# Patient Record
Sex: Male | Born: 1950
Health system: Southern US, Community
[De-identification: ages and names within clinical notes are randomized; demographics above are authoritative.]

## PROBLEM LIST (undated history)

## (undated) DIAGNOSIS — M1711 Unilateral primary osteoarthritis, right knee: Secondary | ICD-10-CM

## (undated) DIAGNOSIS — H9190 Unspecified hearing loss, unspecified ear: Secondary | ICD-10-CM

## (undated) DIAGNOSIS — Q2112 Patent foramen ovale: Secondary | ICD-10-CM

## (undated) DIAGNOSIS — Z8673 Personal history of transient ischemic attack (TIA), and cerebral infarction without residual deficits: Secondary | ICD-10-CM

## (undated) DIAGNOSIS — I82409 Acute embolism and thrombosis of unspecified deep veins of unspecified lower extremity: Secondary | ICD-10-CM

## (undated) DIAGNOSIS — D689 Coagulation defect, unspecified: Secondary | ICD-10-CM

## (undated) DIAGNOSIS — I639 Cerebral infarction, unspecified: Secondary | ICD-10-CM

## (undated) DIAGNOSIS — E785 Hyperlipidemia, unspecified: Secondary | ICD-10-CM

## (undated) DIAGNOSIS — I10 Essential (primary) hypertension: Secondary | ICD-10-CM

## (undated) DIAGNOSIS — Q211 Atrial septal defect: Secondary | ICD-10-CM

## (undated) HISTORY — DX: Hyperlipidemia, unspecified: E78.5

## (undated) HISTORY — DX: Acute embolism and thrombosis of unspecified deep veins of unspecified lower extremity: I82.409

## (undated) HISTORY — PX: APPENDECTOMY: SHX54

## (undated) HISTORY — PX: KNEE ARTHROSCOPY: SHX127

## (undated) HISTORY — DX: Essential (primary) hypertension: I10

## (undated) HISTORY — DX: Unilateral primary osteoarthritis, right knee: M17.11

## (undated) HISTORY — DX: Unspecified hearing loss, unspecified ear: H91.90

## (undated) HISTORY — PX: FRACTURE SURGERY: SHX138

## (undated) HISTORY — DX: Personal history of transient ischemic attack (TIA), and cerebral infarction without residual deficits: Z86.73

## (undated) HISTORY — DX: Cerebral infarction, unspecified: I63.9

## (undated) HISTORY — DX: Patent foramen ovale: Q21.12

## (undated) HISTORY — DX: Coagulation defect, unspecified: D68.9

## (undated) HISTORY — DX: Atrial septal defect: Q21.1

---

## 1998-03-21 DIAGNOSIS — F320 Major depressive disorder, single episode, mild: Secondary | ICD-10-CM | POA: Insufficient documentation

## 1998-03-21 DIAGNOSIS — F17200 Nicotine dependence, unspecified, uncomplicated: Secondary | ICD-10-CM | POA: Insufficient documentation

## 1999-04-29 DIAGNOSIS — Z8673 Personal history of transient ischemic attack (TIA), and cerebral infarction without residual deficits: Secondary | ICD-10-CM

## 1999-04-29 DIAGNOSIS — I639 Cerebral infarction, unspecified: Secondary | ICD-10-CM

## 1999-04-29 HISTORY — DX: Cerebral infarction, unspecified: I63.9

## 1999-04-29 HISTORY — DX: Personal history of transient ischemic attack (TIA), and cerebral infarction without residual deficits: Z86.73

## 2000-01-23 DIAGNOSIS — G4733 Obstructive sleep apnea (adult) (pediatric): Secondary | ICD-10-CM | POA: Insufficient documentation

## 2008-08-29 DIAGNOSIS — I6930 Unspecified sequelae of cerebral infarction: Secondary | ICD-10-CM | POA: Insufficient documentation

## 2008-11-15 DIAGNOSIS — H6990 Unspecified Eustachian tube disorder, unspecified ear: Secondary | ICD-10-CM | POA: Insufficient documentation

## 2010-10-01 ENCOUNTER — Ambulatory Visit: Payer: Self-pay | Admitting: Sports Medicine

## 2011-09-23 ENCOUNTER — Ambulatory Visit: Payer: Self-pay | Admitting: Orthopedic Surgery

## 2011-10-09 DIAGNOSIS — Z8673 Personal history of transient ischemic attack (TIA), and cerebral infarction without residual deficits: Secondary | ICD-10-CM | POA: Insufficient documentation

## 2011-10-09 DIAGNOSIS — I639 Cerebral infarction, unspecified: Secondary | ICD-10-CM | POA: Insufficient documentation

## 2011-10-09 HISTORY — DX: Cerebral infarction, unspecified: I63.9

## 2011-11-12 LAB — HM HEPATITIS C SCREENING LAB: HM HEPATITIS C SCREENING: NEGATIVE

## 2011-11-19 DIAGNOSIS — M7710 Lateral epicondylitis, unspecified elbow: Secondary | ICD-10-CM | POA: Diagnosis not present

## 2012-01-13 ENCOUNTER — Encounter (HOSPITAL_COMMUNITY): Payer: Self-pay | Admitting: Pharmacy Technician

## 2012-01-14 ENCOUNTER — Other Ambulatory Visit: Payer: Self-pay | Admitting: Physician Assistant

## 2012-01-14 ENCOUNTER — Encounter: Payer: Self-pay | Admitting: Physician Assistant

## 2012-01-14 DIAGNOSIS — M1711 Unilateral primary osteoarthritis, right knee: Secondary | ICD-10-CM

## 2012-01-14 DIAGNOSIS — H9190 Unspecified hearing loss, unspecified ear: Secondary | ICD-10-CM

## 2012-01-14 DIAGNOSIS — Q211 Atrial septal defect: Secondary | ICD-10-CM

## 2012-01-14 DIAGNOSIS — Z86718 Personal history of other venous thrombosis and embolism: Secondary | ICD-10-CM | POA: Insufficient documentation

## 2012-01-14 DIAGNOSIS — I82409 Acute embolism and thrombosis of unspecified deep veins of unspecified lower extremity: Secondary | ICD-10-CM

## 2012-01-14 DIAGNOSIS — I10 Essential (primary) hypertension: Secondary | ICD-10-CM

## 2012-01-14 DIAGNOSIS — Q2112 Patent foramen ovale: Secondary | ICD-10-CM

## 2012-01-14 DIAGNOSIS — Z8673 Personal history of transient ischemic attack (TIA), and cerebral infarction without residual deficits: Secondary | ICD-10-CM

## 2012-01-14 DIAGNOSIS — M1710 Unilateral primary osteoarthritis, unspecified knee: Secondary | ICD-10-CM | POA: Diagnosis not present

## 2012-01-14 NOTE — H&P (Addendum)
Chase [name] is an 61 y.o. male.   Chief Complaint: right knee DJD HPI: Chase [name] is a 61 year old seen for follow-up from his persistent right knee pain with degenerative joint disease. This has been persistent. Significant pain with weightbearing and activity that has not responded to conservative care. We have aspirated and injected the right knee on numerous occasions with only temporary relief. We had discussed with him and his wife previously the need for right total knee replacement. He's been cleared for this by his neurologist at [facility]. Dr. Morgenlander who recommended doing a Lovenox bridge prior to and after surgery.  Of note is that at the time of his appointment with neurology on 10/09/11 he was found to have significant hypertension and he's recently had his antihypertensive medications changed by Dr. Donald Fisher and he's now on Amlodipine 10 mg. once a day.  He's on Coumadin since his CVA 12 years ago and does have significant short-term memory loss because of this.   Past Medical History  Diagnosis Date  . History of ischemic vertebrobasilar artery thalamic stroke 2001  . Patent foramen ovale   . DVT (deep venous thrombosis)   . Hypertension   . Hearing deficit   . Right knee DJD     Past Surgical History  Procedure Date  . Knee arthroscopy     right knee    Family History  Problem Relation Age of Onset  . Cancer Father   . Seizures Brother    Social History:  reports that he has been smoking.  He does not have any smokeless tobacco history on file. He reports that he drinks about 25.2 ounces of alcohol per week. His drug history not on file.  Allergies: No Known Allergies   Current Outpatient Prescriptions on File Prior to Visit  Medication Sig Dispense Refill  . amLODipine-benazepril (LOTREL) 5-20 MG per capsule Take 1 capsule by mouth daily.      . potassium gluconate 595 MG TABS Take 595 mg by mouth daily.      . venlafaxine XR (EFFEXOR-XR) 150 MG 24 hr  capsule Take 150 mg by mouth daily.      . warfarin (COUMADIN) 5 MG tablet Take 10 mg by mouth daily.        (Not in a hospital admission)  No results found for this or any previous visit (from the past 48 hour(s)). No results found.  Review of Systems  Constitutional: Negative.   HENT: Negative.   Eyes: Negative.   Respiratory: Negative.   Cardiovascular: Negative.   Gastrointestinal: Negative.   Genitourinary: Negative.   Musculoskeletal: Positive for joint pain.       Right knee  Skin: Negative.   Neurological: Negative.   Endo/Heme/Allergies: Bruises/bleeds easily.  Psychiatric/Behavioral: Positive for memory loss.    Blood pressure 143/88, pulse 79, temperature 98 F (36.7 C), resp. rate 20, height 5' 11" (1.803 m), weight 99.791 kg (220 lb). Physical Exam  Constitutional: He is oriented to person, place, and time. He appears well-developed and well-nourished.  HENT:  Head: Normocephalic and atraumatic.  Mouth/Throat: Oropharynx is clear and moist.       Bilateral hearing aids  Eyes: Conjunctivae normal and EOM are normal. Pupils are equal, round, and reactive to light.  Neck: Neck supple.  Cardiovascular: Normal rate and regular rhythm.   Respiratory: Effort normal and breath sounds normal.  GI: Bowel sounds are normal.  Genitourinary:       Not pertinent to current   symptomatology therefore not examined.  Musculoskeletal:        Examination of his right knee reveals 1 to 2+ crepitation, 1+ synovitis range of motion is 0-120 degrees knee is stable with normal patella tracking diffuse pain and mild varus deformity. Exam of his left knee reveals full range of motion without pain swelling weakness or instability. Vascular exam: pulses 2+ and symmetric. Blood pressure today is 161/87, pulse is 85.  Neurological: He is alert and oriented to person, place, and time.  Skin: Skin is warm and dry.  Psychiatric: He has a normal mood and affect. His behavior is normal. Judgment  and thought content normal.    XRAY:  End stage DJD right knee  Old tibia fracture.  AP,LATERAL, FLEXION, and SUNRISE views show significant joint space narrowing, with periarticular osteophytes, and subchondral sclerosis.  Assessment Patient Active Problem List  Diagnosis  . History of ischemic vertebrobasilar artery thalamic stroke  . Patent foramen ovale  . DVT (deep venous thrombosis)  . Hypertension  . Hearing deficit  . Right knee DJD   Plan I talk to him and his wife about this in detail. His blood pressure still needs to be decreased somewhat and I recommended they see Dr. Fisher for evaluation of this. I do think we need to have him cleared preoperatively by Dr. Fisher. Discussed risks benefits and possible complications of the surgery in detail and they understand this completely.   Chase [name] J 01/14/2012, 3:33 PM    

## 2012-01-15 ENCOUNTER — Other Ambulatory Visit: Payer: Self-pay | Admitting: Physician Assistant

## 2012-01-19 ENCOUNTER — Encounter (HOSPITAL_COMMUNITY)
Admission: RE | Admit: 2012-01-19 | Discharge: 2012-01-19 | Disposition: A | Discharge status: Home / Self Care | Payer: 59 | Source: Ambulatory Visit | Attending: Orthopedic Surgery | Admitting: Orthopedic Surgery

## 2012-01-19 ENCOUNTER — Encounter (HOSPITAL_COMMUNITY): Payer: Self-pay

## 2012-01-19 ENCOUNTER — Encounter (HOSPITAL_COMMUNITY)
Admission: RE | Admit: 2012-01-19 | Discharge: 2012-01-19 | Disposition: A | Discharge status: Home / Self Care | Payer: 59 | Source: Ambulatory Visit | Attending: Physician Assistant | Admitting: Physician Assistant

## 2012-01-19 DIAGNOSIS — Z01811 Encounter for preprocedural respiratory examination: Secondary | ICD-10-CM | POA: Diagnosis not present

## 2012-01-19 HISTORY — DX: Cerebral infarction, unspecified: I63.9

## 2012-01-19 LAB — CBC
HCT: 44.7 % (ref 39.0–52.0)
Hemoglobin: 16.2 g/dL (ref 13.0–17.0)
RDW: 13.8 % (ref 11.5–15.5)
WBC: 9.2 10*3/uL (ref 4.0–10.5)

## 2012-01-19 LAB — COMPREHENSIVE METABOLIC PANEL
Albumin: 3.9 g/dL (ref 3.5–5.2)
Alkaline Phosphatase: 52 U/L (ref 39–117)
BUN: 14 mg/dL (ref 6–23)
Calcium: 9.2 mg/dL (ref 8.4–10.5)
Creatinine, Ser: 0.85 mg/dL (ref 0.50–1.35)
Potassium: 4.1 mEq/L (ref 3.5–5.1)
Total Protein: 6.7 g/dL (ref 6.0–8.3)

## 2012-01-19 LAB — TYPE AND SCREEN
ABO/RH(D): A POS
Antibody Screen: NEGATIVE

## 2012-01-19 LAB — URINALYSIS, ROUTINE W REFLEX MICROSCOPIC
Bilirubin Urine: NEGATIVE
Ketones, ur: NEGATIVE mg/dL
Nitrite: NEGATIVE
Urobilinogen, UA: 0.2 mg/dL (ref 0.0–1.0)

## 2012-01-19 LAB — PROTIME-INR
INR: 2.28 — ABNORMAL HIGH (ref 0.00–1.49)
Prothrombin Time: 24.1 seconds — ABNORMAL HIGH (ref 11.6–15.2)

## 2012-01-19 LAB — ABO/RH: ABO/RH(D): A POS

## 2012-01-19 LAB — SURGICAL PCR SCREEN: MRSA, PCR: NEGATIVE

## 2012-01-19 NOTE — Pre-Procedure Instructions (Signed)
Chase [name]  01/19/2012   Your procedure is scheduled on:  Monday September 30  Report to [facility] at 7:15 AM.  Call this number if you have problems the morning of surgery: [phone]   Remember:   Do not eat or drink:After Midnight.    Take these medicines the morning of surgery with A SIP OF WATER: Effexor (venlafaxine)   Do not wear jewelry, make-up or nail polish.  Do not wear lotions, powders, or perfumes. You may wear deodorant.  Do not shave 48 hours prior to surgery. Men may shave face and neck.  Do not bring valuables to the hospital.  Contacts, dentures or bridgework may not be worn into surgery.  Leave suitcase in the car. After surgery it may be brought to your room.  For patients admitted to the hospital, checkout time is 11:00 AM the day of discharge.   Patients discharged the day of surgery will not be allowed to drive home.  Name and phone number of your driver: NA  Special Instructions: Incentive Spirometry - Practice and bring it with you on the day of surgery. Shower using CHG 2 nights before surgery and the night before surgery.  If you shower the day of surgery use CHG.  Use special wash - you have one bottle of CHG for all showers.  You should use approximately 1/3 of the bottle for each shower.   Please read over the following fact sheets that you were given: Pain Booklet, Coughing and Deep Breathing, Blood Transfusion Information and Surgical Site Infection Prevention

## 2012-01-19 NOTE — Progress Notes (Addendum)
Pt has appt with PCP today. Will stop coumadin 5 days prior to surgery and bridge to lovenox. Pt's BP elevated at PAT, he and wife state it is usually WNL. Requested last OV.   Requested records from [facility] re patent foramen ovale found during post-CVA work up. Received some notes, no ECHO, stress, cardiac cath, EKG - requested these if available.   Leaving chart for anesthesia to review.

## 2012-01-20 ENCOUNTER — Ambulatory Visit (INDEPENDENT_AMBULATORY_CARE_PROVIDER_SITE_OTHER): Payer: 59 | Admitting: Cardiovascular Disease

## 2012-01-20 ENCOUNTER — Encounter: Payer: Self-pay | Admitting: Cardiovascular Disease

## 2012-01-20 VITALS — BP 138/108 | HR 80 | Ht 70.5 in | Wt 216.2 lb

## 2012-01-20 DIAGNOSIS — Z01810 Encounter for preprocedural cardiovascular examination: Secondary | ICD-10-CM

## 2012-01-20 DIAGNOSIS — I10 Essential (primary) hypertension: Secondary | ICD-10-CM

## 2012-01-20 DIAGNOSIS — I771 Stricture of artery: Secondary | ICD-10-CM | POA: Diagnosis not present

## 2012-01-20 DIAGNOSIS — Z8673 Personal history of transient ischemic attack (TIA), and cerebral infarction without residual deficits: Secondary | ICD-10-CM

## 2012-01-20 DIAGNOSIS — I712 Thoracic aortic aneurysm, without rupture, unspecified: Secondary | ICD-10-CM | POA: Insufficient documentation

## 2012-01-20 DIAGNOSIS — Q211 Atrial septal defect: Secondary | ICD-10-CM

## 2012-01-20 MED ORDER — AMLODIPINE BESY-BENAZEPRIL HCL 10-20 MG PO CAPS: 1.0000 | ORAL_CAPSULE | Freq: Every day | ORAL | Status: DC

## 2012-01-20 NOTE — Assessment & Plan Note (Signed)
Acceptable risk prior to surgery. No further workup needed at this time.

## 2012-01-20 NOTE — Patient Instructions (Addendum)
You are doing well. Please increase amlodipine benazepril to 10/20 mg daily  Please call us if you have new issues that need to be addressed before your next appt.

## 2012-01-20 NOTE — Assessment & Plan Note (Signed)
Chest x-ray suggesting tortuous aorta or dilated aortic root. Limited echocardiogram done today in the office that showed mildly dilated aortic root and descending aorta measured 4.1 cm . No further workup needed at this time. Full echocardiogram will be done at a later date .

## 2012-01-20 NOTE — Assessment & Plan Note (Signed)
Blood pressure continues to be elevated by their numbers at home. We have increased his amlodipine benazepril to 10/20 mg daily up from 5/20 mg daily.

## 2012-01-20 NOTE — Assessment & Plan Note (Signed)
I would agree with Lovenox bridging prior to right knee replacement surgery. He has a power than provided by Dr. Sherrie Mustache which I think is appropriate.

## 2012-01-20 NOTE — Assessment & Plan Note (Signed)
Given history of stroke, I would suggest he stay on warfarin.

## 2012-01-20 NOTE — Progress Notes (Signed)
   Patient ID: Chase [name], male    DOB: November 25, 1950, 61 y.o.   MRN: [id number]  HPI Comments: Mr. Wernette is a pleasant 61 year old gentleman with history of stroke more than 10 years ago with DVT, PFO identified on workup, previous hypercoagulable workup at [facility] unrevealing with recommendation made by hematology to be anticoagulated indefinitely who presents for new patient evaluation prior to right knee replacement surgery. He does have a long history of smoking. He is a patient of Dr. Sherrie Mustache.  He reports that he's had no further TIA or strokelike symptoms. Recent evaluation by neurology has recommended Lovenox bridge prior to the surgery scheduled next week.  Notes indicate alcohol history.  Recent workup for surgery suggested enlarged aorta on chest x-ray.  He reports his blood pressure at home has typically been in the 138-140 systolic range over 90-100 diastolic Previous workup at 2001 confirmed stroke by MRI. TEE was done at that time that confirmed PFO. Results are not available to [place] at this time.  EKG shows normal sinus rhythm with rate 80 beats per minute with no significant ST or T wave changes Recent blood work shows total cholesterol 214, LDL 138, HDL 65    Outpatient Encounter Prescriptions as of 01/20/2012  Medication Sig Dispense Refill  . amLODipine-benazepril (LOTREL) 5-20 MG per capsule Take 1 capsule by mouth daily.      . potassium gluconate 595 MG TABS Take 595 mg by mouth daily.      Marland Kitchen venlafaxine XR (EFFEXOR-XR) 150 MG 24 hr capsule Take 150 mg by mouth daily.      Marland Kitchen warfarin (COUMADIN) 5 MG tablet Take 10 mg by mouth daily.         Review of Systems  Constitutional: Negative.   HENT: Negative.   Eyes: Negative.   Respiratory: Negative.   Cardiovascular: Negative.   Gastrointestinal: Negative.   Musculoskeletal: Negative.   Skin: Negative.   Neurological: Negative.   Hematological: Negative.   Psychiatric/Behavioral: Negative.   All other systems  reviewed and are negative.    BP 138/108  Pulse 80  Ht 5' 10.5" (1.791 m)  Wt 216 lb 4 oz (98.09 kg)  BMI 30.59 kg/m2  Physical Exam  Nursing note and vitals reviewed. Constitutional: He is oriented to person, place, and time. He appears well-developed and well-nourished.  HENT:  Head: Normocephalic.  Nose: Nose normal.  Mouth/Throat: Oropharynx is clear and moist.  Eyes: Conjunctivae normal are normal. Pupils are equal, round, and reactive to light.  Neck: Normal range of motion. Neck supple. No JVD present.  Cardiovascular: Normal rate, regular rhythm, S1 normal, S2 normal, normal heart sounds and intact distal pulses.  Exam reveals no gallop and no friction rub.   No murmur heard. Pulmonary/Chest: Effort normal and breath sounds normal. No respiratory distress. He has no wheezes. He has no rales. He exhibits no tenderness.  Abdominal: Soft. Bowel sounds are normal. He exhibits no distension. There is no tenderness.  Musculoskeletal: Normal range of motion. He exhibits no edema and no tenderness.  Lymphadenopathy:    He has no cervical adenopathy.  Neurological: He is alert and oriented to person, place, and time. Coordination normal.  Skin: Skin is warm and dry. No rash noted. No erythema.  Psychiatric: He has a normal mood and affect. His behavior is normal. Judgment and thought content normal.           Assessment and Plan

## 2012-01-20 NOTE — Consult Note (Addendum)
Anesthesia Chart Review:  Patient is a 61 year old male scheduled for right TKR on 01/26/12 by Dr. Thurston Hole.  History includes smoking, obesity, hearing deficit, HTN, HLD, left thalamic CVA '01 with work-up revealing a patent PFO and subacute left thigh and old DVT in his right thigh.  By notes, he had a hypercoagulation work-up then and no specific cause was found.  Indefinite anticoagulation therapy was recommended.  His Neurologist is Dr. Jerl Mina at [facility].  He was notified of patient's surgery and recommended a Lovenox bridge.  According to his office note from 2002, "He does have a PFO but closing that PFO is not going to help him if he is still forming clots due to coagulopathy.  PCP is Dr. Mila Merry at [facility].  EKG on 01/20/12 showed NSR.  TEE on 03/18/00 ([facility]) showed preserved LV systolic function, trivial MR, no evidence of thrombus in the La including the LA appendage, patent PFO by flow Doppler examination.  Microcavitation study showed right to left shunting with Valsalva, none at rest.   CXR on 01/19/12 showed: Increased AP diameter and interstitial coarsening may reflect underlying COPD. Prominent aortic knob may be secondary to aortic dilatation or tortuosity. This can be further characterized with CTA chest or echocardiogram.   Labs noted.  His urine culture is still pending.  He will need a repeat PT/PTT on arrival.  I spoke with Julien Girt, PA-C today.  She has already noted CXR results.  Patient is seeing Dr. Mariah Milling today with [facility] for an office visit and echo.  I will follow-up records when available.  Shonna Chock, PA-C 01/20/12 1539  Addendum: 01/21/12 1005 I reviewed Dr. Windell Hummingbird office note from 01/20/12.  He increased dosages of patient's anti-hypertensive regimen to improve BP control.  He also did a limited echo that showed "showed mildly dilated aortic root and descending aorta measured 4.1 cm . No further workup needed at this time.  Full echocardiogram will be done at a later date ."  He agrees with long-term Coumadin with plans for peri-operative Lovenox bridging due to his history of CVA and PFO.  He feels patient is, "Acceptable risk prior to surgery. No further workup needed at this time."

## 2012-01-21 LAB — URINE CULTURE: Culture: NO GROWTH

## 2012-01-22 ENCOUNTER — Other Ambulatory Visit: Payer: Self-pay

## 2012-01-22 ENCOUNTER — Other Ambulatory Visit: Payer: Self-pay | Admitting: Cardiovascular Disease

## 2012-01-22 DIAGNOSIS — I771 Stricture of artery: Secondary | ICD-10-CM

## 2012-01-22 DIAGNOSIS — Z8673 Personal history of transient ischemic attack (TIA), and cerebral infarction without residual deficits: Secondary | ICD-10-CM

## 2012-01-22 DIAGNOSIS — I10 Essential (primary) hypertension: Secondary | ICD-10-CM

## 2012-01-22 DIAGNOSIS — Q211 Atrial septal defect: Secondary | ICD-10-CM

## 2012-01-23 NOTE — Progress Notes (Signed)
Patient notified of time change to be here at 0530 on 01/26/2012. Verbalized understanding. Nickolas Madrid

## 2012-01-25 ENCOUNTER — Other Ambulatory Visit: Payer: Self-pay | Admitting: Physician Assistant

## 2012-01-25 MED ORDER — CHLORHEXIDINE GLUCONATE 4 % EX LIQD: 60.0000 mL | Freq: Once | CUTANEOUS | Status: DC

## 2012-01-25 MED ORDER — CEFAZOLIN SODIUM-DEXTROSE 2-3 GM-% IV SOLR
2.0000 g | INTRAVENOUS | Status: AC
  Administered 2012-01-26: 2 g via INTRAVENOUS
  Filled 2012-01-25: qty 50

## 2012-01-25 MED ORDER — LACTATED RINGERS IV SOLN: INTRAVENOUS | Status: DC

## 2012-01-25 MED ORDER — POVIDONE-IODINE 7.5 % EX SOLN
Freq: Once | CUTANEOUS | Status: DC
  Filled 2012-01-25: qty 118

## 2012-01-26 ENCOUNTER — Inpatient Hospital Stay (HOSPITAL_COMMUNITY)
Admission: RE | Admit: 2012-01-26 | Discharge: 2012-01-27 | DRG: 470 | Disposition: A | Discharge status: Home / Self Care | Payer: 59 | Source: Ambulatory Visit | Attending: Orthopedic Surgery | Admitting: Orthopedic Surgery

## 2012-01-26 ENCOUNTER — Encounter (HOSPITAL_COMMUNITY)
Admission: RE | Disposition: A | Discharge status: Home / Self Care | Payer: Self-pay | Source: Ambulatory Visit | Attending: Orthopedic Surgery

## 2012-01-26 ENCOUNTER — Ambulatory Visit (HOSPITAL_COMMUNITY): Payer: 59 | Admitting: Vascular Surgery

## 2012-01-26 ENCOUNTER — Encounter (HOSPITAL_COMMUNITY): Payer: Self-pay | Admitting: Vascular Surgery

## 2012-01-26 DIAGNOSIS — I10 Essential (primary) hypertension: Secondary | ICD-10-CM | POA: Diagnosis present

## 2012-01-26 DIAGNOSIS — D62 Acute posthemorrhagic anemia: Secondary | ICD-10-CM | POA: Diagnosis not present

## 2012-01-26 DIAGNOSIS — M1711 Unilateral primary osteoarthritis, right knee: Secondary | ICD-10-CM | POA: Diagnosis present

## 2012-01-26 DIAGNOSIS — Q2111 Secundum atrial septal defect: Secondary | ICD-10-CM

## 2012-01-26 DIAGNOSIS — M1710 Unilateral primary osteoarthritis, unspecified knee: Secondary | ICD-10-CM | POA: Diagnosis not present

## 2012-01-26 DIAGNOSIS — Z86718 Personal history of other venous thrombosis and embolism: Secondary | ICD-10-CM | POA: Diagnosis present

## 2012-01-26 DIAGNOSIS — Q2112 Patent foramen ovale: Secondary | ICD-10-CM

## 2012-01-26 DIAGNOSIS — E785 Hyperlipidemia, unspecified: Secondary | ICD-10-CM | POA: Diagnosis present

## 2012-01-26 DIAGNOSIS — Z7901 Long term (current) use of anticoagulants: Secondary | ICD-10-CM

## 2012-01-26 DIAGNOSIS — M898X9 Other specified disorders of bone, unspecified site: Secondary | ICD-10-CM | POA: Diagnosis present

## 2012-01-26 DIAGNOSIS — I712 Thoracic aortic aneurysm, without rupture, unspecified: Secondary | ICD-10-CM | POA: Diagnosis present

## 2012-01-26 DIAGNOSIS — H9190 Unspecified hearing loss, unspecified ear: Secondary | ICD-10-CM | POA: Diagnosis present

## 2012-01-26 DIAGNOSIS — Q211 Atrial septal defect: Secondary | ICD-10-CM

## 2012-01-26 DIAGNOSIS — M21169 Varus deformity, not elsewhere classified, unspecified knee: Secondary | ICD-10-CM | POA: Diagnosis present

## 2012-01-26 DIAGNOSIS — F17200 Nicotine dependence, unspecified, uncomplicated: Secondary | ICD-10-CM | POA: Diagnosis present

## 2012-01-26 DIAGNOSIS — I771 Stricture of artery: Secondary | ICD-10-CM | POA: Diagnosis present

## 2012-01-26 DIAGNOSIS — G8918 Other acute postprocedural pain: Secondary | ICD-10-CM | POA: Diagnosis not present

## 2012-01-26 DIAGNOSIS — Z8673 Personal history of transient ischemic attack (TIA), and cerebral infarction without residual deficits: Secondary | ICD-10-CM

## 2012-01-26 HISTORY — PX: TOTAL KNEE ARTHROPLASTY: SHX125

## 2012-01-26 LAB — PROTIME-INR: Prothrombin Time: 12.5 seconds (ref 11.6–15.2)

## 2012-01-26 LAB — APTT: aPTT: 28 seconds (ref 24–37)

## 2012-01-26 SURGERY — ARTHROPLASTY, KNEE, TOTAL
Anesthesia: General | Site: Knee | Laterality: Right | Wound class: Clean

## 2012-01-26 SURGERY — ARTHROPLASTY, KNEE, TOTAL
Anesthesia: General | Laterality: Right

## 2012-01-26 MED ORDER — ONDANSETRON HCL 4 MG PO TABS: 4.0000 mg | ORAL_TABLET | Freq: Four times a day (QID) | ORAL | Status: DC | PRN

## 2012-01-26 MED ORDER — WARFARIN SODIUM 2.5 MG PO TABS
12.5000 mg | ORAL_TABLET | Freq: Once | ORAL | Status: AC
  Administered 2012-01-26: 12.5 mg via ORAL
  Filled 2012-01-26: qty 1

## 2012-01-26 MED ORDER — HYDROMORPHONE HCL PF 1 MG/ML IJ SOLN
0.5000 mg | INTRAMUSCULAR | Status: DC | PRN
  Administered 2012-01-26 – 2012-01-27 (×6): 1 mg via INTRAVENOUS
  Filled 2012-01-26 (×6): qty 1

## 2012-01-26 MED ORDER — ONDANSETRON HCL 4 MG/2ML IJ SOLN: 4.0000 mg | Freq: Four times a day (QID) | INTRAMUSCULAR | Status: DC | PRN

## 2012-01-26 MED ORDER — BUPIVACAINE-EPINEPHRINE 0.25% -1:200000 IJ SOLN
INTRAMUSCULAR | Status: DC | PRN
  Administered 2012-01-26: 30 mL

## 2012-01-26 MED ORDER — VENLAFAXINE HCL ER 150 MG PO CP24
150.0000 mg | ORAL_CAPSULE | Freq: Every day | ORAL | Status: DC
  Administered 2012-01-27: 150 mg via ORAL
  Filled 2012-01-26: qty 1

## 2012-01-26 MED ORDER — BENAZEPRIL HCL 20 MG PO TABS
20.0000 mg | ORAL_TABLET | Freq: Every day | ORAL | Status: DC
  Administered 2012-01-26 – 2012-01-27 (×2): 20 mg via ORAL
  Filled 2012-01-26 (×2): qty 1

## 2012-01-26 MED ORDER — CEFUROXIME SODIUM 1.5 G IJ SOLR
INTRAMUSCULAR | Status: DC | PRN
  Administered 2012-01-26: 1.5 g

## 2012-01-26 MED ORDER — HYDROMORPHONE HCL PF 1 MG/ML IJ SOLN: 0.5000 mg | Freq: Once | INTRAMUSCULAR | Status: DC

## 2012-01-26 MED ORDER — OXYCODONE HCL 5 MG PO TABS
5.0000 mg | ORAL_TABLET | ORAL | Status: DC | PRN
  Administered 2012-01-26 – 2012-01-27 (×6): 10 mg via ORAL
  Filled 2012-01-26 (×6): qty 2

## 2012-01-26 MED ORDER — ONDANSETRON HCL 4 MG/2ML IJ SOLN
INTRAMUSCULAR | Status: DC | PRN
  Administered 2012-01-26: 4 mg via INTRAVENOUS

## 2012-01-26 MED ORDER — DOCUSATE SODIUM 100 MG PO CAPS
100.0000 mg | ORAL_CAPSULE | Freq: Two times a day (BID) | ORAL | Status: DC
  Administered 2012-01-26 (×2): 100 mg via ORAL
  Filled 2012-01-26 (×4): qty 1

## 2012-01-26 MED ORDER — SODIUM CHLORIDE 0.9 % IR SOLN
Status: DC | PRN
  Administered 2012-01-26: 3000 mL

## 2012-01-26 MED ORDER — WARFARIN - PHARMACIST DOSING INPATIENT: Freq: Every day | Status: DC

## 2012-01-26 MED ORDER — MENTHOL 3 MG MT LOZG: 1.0000 | LOZENGE | OROMUCOSAL | Status: DC | PRN

## 2012-01-26 MED ORDER — ACETAMINOPHEN 650 MG RE SUPP: 650.0000 mg | Freq: Four times a day (QID) | RECTAL | Status: DC | PRN

## 2012-01-26 MED ORDER — GLYCOPYRROLATE 0.2 MG/ML IJ SOLN
INTRAMUSCULAR | Status: DC | PRN
  Administered 2012-01-26 (×2): 0.2 mg via INTRAVENOUS

## 2012-01-26 MED ORDER — ACETAMINOPHEN 10 MG/ML IV SOLN
1000.0000 mg | Freq: Once | INTRAVENOUS | Status: AC | PRN
  Administered 2012-01-26: 1000 mg via INTRAVENOUS

## 2012-01-26 MED ORDER — HYDROMORPHONE HCL PF 1 MG/ML IJ SOLN
INTRAMUSCULAR | Status: AC
  Filled 2012-01-26: qty 1

## 2012-01-26 MED ORDER — POTASSIUM CHLORIDE IN NACL 20-0.9 MEQ/L-% IV SOLN
INTRAVENOUS | Status: DC
  Administered 2012-01-26 – 2012-01-27 (×2): via INTRAVENOUS
  Filled 2012-01-26 (×5): qty 1000

## 2012-01-26 MED ORDER — LACTATED RINGERS IV SOLN
INTRAVENOUS | Status: DC | PRN
  Administered 2012-01-26 (×2): via INTRAVENOUS

## 2012-01-26 MED ORDER — CEFAZOLIN SODIUM-DEXTROSE 2-3 GM-% IV SOLR
2.0000 g | Freq: Four times a day (QID) | INTRAVENOUS | Status: AC
  Administered 2012-01-26 (×2): 2 g via INTRAVENOUS
  Filled 2012-01-26 (×2): qty 50

## 2012-01-26 MED ORDER — ACETAMINOPHEN 10 MG/ML IV SOLN
INTRAVENOUS | Status: AC
  Filled 2012-01-26: qty 100

## 2012-01-26 MED ORDER — PHENOL 1.4 % MT LIQD: 1.0000 | OROMUCOSAL | Status: DC | PRN

## 2012-01-26 MED ORDER — CEFUROXIME SODIUM 1.5 G IJ SOLR
INTRAMUSCULAR | Status: AC
  Filled 2012-01-26: qty 1.5

## 2012-01-26 MED ORDER — FENTANYL CITRATE 0.05 MG/ML IJ SOLN
INTRAMUSCULAR | Status: DC | PRN
  Administered 2012-01-26 (×3): 50 ug via INTRAVENOUS
  Administered 2012-01-26: 100 ug via INTRAVENOUS

## 2012-01-26 MED ORDER — ACETAMINOPHEN 325 MG PO TABS: 650.0000 mg | ORAL_TABLET | Freq: Four times a day (QID) | ORAL | Status: DC | PRN

## 2012-01-26 MED ORDER — LIDOCAINE HCL (CARDIAC) 20 MG/ML IV SOLN
INTRAVENOUS | Status: DC | PRN
  Administered 2012-01-26: 50 mg via INTRAVENOUS

## 2012-01-26 MED ORDER — DIPHENHYDRAMINE HCL 12.5 MG/5ML PO ELIX: 12.5000 mg | ORAL_SOLUTION | ORAL | Status: DC | PRN

## 2012-01-26 MED ORDER — METOCLOPRAMIDE HCL 5 MG/ML IJ SOLN: 5.0000 mg | Freq: Three times a day (TID) | INTRAMUSCULAR | Status: DC | PRN

## 2012-01-26 MED ORDER — HYDROMORPHONE HCL PF 1 MG/ML IJ SOLN
0.2500 mg | INTRAMUSCULAR | Status: DC | PRN
  Administered 2012-01-26 (×5): 0.5 mg via INTRAVENOUS

## 2012-01-26 MED ORDER — AMLODIPINE BESYLATE 5 MG PO TABS
5.0000 mg | ORAL_TABLET | Freq: Every day | ORAL | Status: DC
  Administered 2012-01-26 – 2012-01-27 (×2): 5 mg via ORAL
  Filled 2012-01-26 (×2): qty 1

## 2012-01-26 MED ORDER — PROPOFOL 10 MG/ML IV BOLUS
INTRAVENOUS | Status: DC | PRN
  Administered 2012-01-26: 175 mg via INTRAVENOUS

## 2012-01-26 MED ORDER — AMLODIPINE BESY-BENAZEPRIL HCL 5-20 MG PO CAPS: 1.0000 | ORAL_CAPSULE | Freq: Every day | ORAL | Status: DC

## 2012-01-26 MED ORDER — MIDAZOLAM HCL 5 MG/5ML IJ SOLN
INTRAMUSCULAR | Status: DC | PRN
  Administered 2012-01-26: 2 mg via INTRAVENOUS

## 2012-01-26 MED ORDER — ENOXAPARIN SODIUM 30 MG/0.3ML ~~LOC~~ SOLN
30.0000 mg | Freq: Two times a day (BID) | SUBCUTANEOUS | Status: DC
  Filled 2012-01-26: qty 0.3

## 2012-01-26 MED ORDER — METHOCARBAMOL 500 MG PO TABS
500.0000 mg | ORAL_TABLET | Freq: Four times a day (QID) | ORAL | Status: DC | PRN
  Administered 2012-01-26 – 2012-01-27 (×3): 500 mg via ORAL
  Filled 2012-01-26 (×3): qty 1

## 2012-01-26 MED ORDER — METOCLOPRAMIDE HCL 10 MG PO TABS: 5.0000 mg | ORAL_TABLET | Freq: Three times a day (TID) | ORAL | Status: DC | PRN

## 2012-01-26 MED ORDER — ONDANSETRON HCL 4 MG/2ML IJ SOLN: 4.0000 mg | Freq: Once | INTRAMUSCULAR | Status: DC | PRN

## 2012-01-26 MED ORDER — BISACODYL 5 MG PO TBEC
10.0000 mg | DELAYED_RELEASE_TABLET | Freq: Every day | ORAL | Status: DC
  Administered 2012-01-26: 10 mg via ORAL
  Filled 2012-01-26: qty 1
  Filled 2012-01-26: qty 2

## 2012-01-26 MED ORDER — BUPIVACAINE-EPINEPHRINE PF 0.25-1:200000 % IJ SOLN
INTRAMUSCULAR | Status: AC
  Filled 2012-01-26: qty 30

## 2012-01-26 SURGICAL SUPPLY — 67 items
BANDAGE ESMARK 6X9 LF (GAUZE/BANDAGES/DRESSINGS) ×1 IMPLANT
BLADE SAGITTAL 25.0X1.19X90 (BLADE) ×2 IMPLANT
BLADE SAW SGTL 11.0X1.19X90.0M (BLADE) IMPLANT
BLADE SAW SGTL 13.0X1.19X90.0M (BLADE) ×2 IMPLANT
BLADE SURG 10 STRL SS (BLADE) ×4 IMPLANT
BNDG ELASTIC 6X15 VLCR STRL LF (GAUZE/BANDAGES/DRESSINGS) ×2 IMPLANT
BNDG ESMARK 6X9 LF (GAUZE/BANDAGES/DRESSINGS) ×2
BOWL SMART MIX CTS (DISPOSABLE) ×2 IMPLANT
CEMENT HV SMART SET (Cement) ×4 IMPLANT
CLOTH BEACON ORANGE TIMEOUT ST (SAFETY) ×2 IMPLANT
COVER BACK TABLE 24X17X13 BIG (DRAPES) IMPLANT
COVER PROBE W GEL 5X96 (DRAPES) ×2 IMPLANT
COVER SURGICAL LIGHT HANDLE (MISCELLANEOUS) ×2 IMPLANT
CUFF TOURNIQUET SINGLE 34IN LL (TOURNIQUET CUFF) ×2 IMPLANT
CUFF TOURNIQUET SINGLE 44IN (TOURNIQUET CUFF) IMPLANT
DRAPE EXTREMITY T 121X128X90 (DRAPE) ×2 IMPLANT
DRAPE INCISE IOBAN 66X45 STRL (DRAPES) ×2 IMPLANT
DRAPE PROXIMA HALF (DRAPES) ×2 IMPLANT
DRAPE U-SHAPE 47X51 STRL (DRAPES) ×2 IMPLANT
DRSG ADAPTIC 3X8 NADH LF (GAUZE/BANDAGES/DRESSINGS) ×2 IMPLANT
DRSG PAD ABDOMINAL 8X10 ST (GAUZE/BANDAGES/DRESSINGS) ×4 IMPLANT
DURAPREP 26ML APPLICATOR (WOUND CARE) ×2 IMPLANT
ELECT CAUTERY BLADE 6.4 (BLADE) ×2 IMPLANT
ELECT REM PT RETURN 9FT ADLT (ELECTROSURGICAL) ×2
ELECTRODE REM PT RTRN 9FT ADLT (ELECTROSURGICAL) ×1 IMPLANT
EVACUATOR 1/8 PVC DRAIN (DRAIN) ×2 IMPLANT
FACESHIELD LNG OPTICON STERILE (SAFETY) ×2 IMPLANT
GLOVE BIO SURGEON STRL SZ7 (GLOVE) ×2 IMPLANT
GLOVE BIOGEL PI IND STRL 7.0 (GLOVE) ×1 IMPLANT
GLOVE BIOGEL PI IND STRL 7.5 (GLOVE) ×1 IMPLANT
GLOVE BIOGEL PI INDICATOR 7.0 (GLOVE) ×1
GLOVE BIOGEL PI INDICATOR 7.5 (GLOVE) ×1
GLOVE SS BIOGEL STRL SZ 7.5 (GLOVE) ×1 IMPLANT
GLOVE SUPERSENSE BIOGEL SZ 7.5 (GLOVE) ×1
GOWN PREVENTION PLUS XLARGE (GOWN DISPOSABLE) ×4 IMPLANT
GOWN STRL NON-REIN LRG LVL3 (GOWN DISPOSABLE) ×4 IMPLANT
HANDPIECE INTERPULSE COAX TIP (DISPOSABLE) ×1
HOOD PEEL AWAY FACE SHEILD DIS (HOOD) ×4 IMPLANT
IMMOBILIZER KNEE 22 UNIV (SOFTGOODS) ×2 IMPLANT
INSERT CUSHION PRONEVIEW LG (MISCELLANEOUS) ×2 IMPLANT
KIT BASIN OR (CUSTOM PROCEDURE TRAY) ×2 IMPLANT
KIT ROOM TURNOVER OR (KITS) ×2 IMPLANT
MANIFOLD NEPTUNE II (INSTRUMENTS) ×2 IMPLANT
NS IRRIG 1000ML POUR BTL (IV SOLUTION) ×2 IMPLANT
PACK TOTAL JOINT (CUSTOM PROCEDURE TRAY) ×2 IMPLANT
PAD ARMBOARD 7.5X6 YLW CONV (MISCELLANEOUS) ×4 IMPLANT
PAD CAST 4YDX4 CTTN HI CHSV (CAST SUPPLIES) ×1 IMPLANT
PADDING CAST COTTON 4X4 STRL (CAST SUPPLIES) ×1
PADDING CAST COTTON 6X4 STRL (CAST SUPPLIES) ×2 IMPLANT
POSITIONER HEAD PRONE TRACH (MISCELLANEOUS) ×2 IMPLANT
RUBBERBAND STERILE (MISCELLANEOUS) ×2 IMPLANT
SET HNDPC FAN SPRY TIP SCT (DISPOSABLE) ×1 IMPLANT
SPONGE GAUZE 4X4 12PLY (GAUZE/BANDAGES/DRESSINGS) ×2 IMPLANT
STRIP CLOSURE SKIN 1/2X4 (GAUZE/BANDAGES/DRESSINGS) ×2 IMPLANT
SUCTION FRAZIER TIP 10 FR DISP (SUCTIONS) ×2 IMPLANT
SUT ETHIBOND NAB CT1 #1 30IN (SUTURE) IMPLANT
SUT MNCRL AB 3-0 PS2 18 (SUTURE) ×2 IMPLANT
SUT VIC AB 0 CT1 27 (SUTURE) ×2
SUT VIC AB 0 CT1 27XBRD ANBCTR (SUTURE) ×2 IMPLANT
SUT VIC AB 2-0 CT1 27 (SUTURE) ×2
SUT VIC AB 2-0 CT1 TAPERPNT 27 (SUTURE) ×2 IMPLANT
SUT VLOC 180 0 24IN GS25 (SUTURE) ×2 IMPLANT
SYR 30ML SLIP (SYRINGE) ×2 IMPLANT
TOWEL OR 17X24 6PK STRL BLUE (TOWEL DISPOSABLE) ×2 IMPLANT
TOWEL OR 17X26 10 PK STRL BLUE (TOWEL DISPOSABLE) ×2 IMPLANT
TRAY FOLEY CATH 14FR (SET/KITS/TRAYS/PACK) ×2 IMPLANT
WATER STERILE IRR 1000ML POUR (IV SOLUTION) ×6 IMPLANT

## 2012-01-26 NOTE — H&P (View-Only) (Signed)
Chase [name] is an 61 y.o. male.   Chief Complaint: right knee DJD HPI: Chase [name] is a 61 year old seen for follow-up from his persistent right knee pain with degenerative joint disease. This has been persistent. Significant pain with weightbearing and activity that has not responded to conservative care. We have aspirated and injected the right knee on numerous occasions with only temporary relief. We had discussed with him and his wife previously the need for right total knee replacement. He's been cleared for this by his neurologist at [facility]. Dr. Corwin Levins who recommended doing a Lovenox bridge prior to and after surgery.  Of note is that at the time of his appointment with neurology on 10/09/11 he was found to have significant hypertension and he's recently had his antihypertensive medications changed by Dr. Mila Merry and he's now on Amlodipine 10 mg. once a day.  He's on Coumadin since his CVA 12 years ago and does have significant short-term memory loss because of this.   Past Medical History  Diagnosis Date  . History of ischemic vertebrobasilar artery thalamic stroke 2001  . Patent foramen ovale   . DVT (deep venous thrombosis)   . Hypertension   . Hearing deficit   . Right knee DJD     Past Surgical History  Procedure Date  . Knee arthroscopy     right knee    Family History  Problem Relation Age of Onset  . Cancer Father   . Seizures Brother    Social History:  reports that he has been smoking.  He does not have any smokeless tobacco history on file. He reports that he drinks about 25.2 ounces of alcohol per week. His drug history not on file.  Allergies: No Known Allergies   Current Outpatient Prescriptions on File Prior to Visit  Medication Sig Dispense Refill  . amLODipine-benazepril (LOTREL) 5-20 MG per capsule Take 1 capsule by mouth daily.      . potassium gluconate 595 MG TABS Take 595 mg by mouth daily.      Marland Kitchen venlafaxine XR (EFFEXOR-XR) 150 MG 24 hr  capsule Take 150 mg by mouth daily.      Marland Kitchen warfarin (COUMADIN) 5 MG tablet Take 10 mg by mouth daily.        (Not in a hospital admission)  No results found for this or any previous visit (from the past 48 hour(s)). No results found.  Review of Systems  Constitutional: Negative.   HENT: Negative.   Eyes: Negative.   Respiratory: Negative.   Cardiovascular: Negative.   Gastrointestinal: Negative.   Genitourinary: Negative.   Musculoskeletal: Positive for joint pain.       Right knee  Skin: Negative.   Neurological: Negative.   Endo/Heme/Allergies: Bruises/bleeds easily.  Psychiatric/Behavioral: Positive for memory loss.    Blood pressure 143/88, pulse 79, temperature 98 F (36.7 C), resp. rate 20, height 5\' 11"  (1.803 m), weight 99.791 kg (220 lb). Physical Exam  Constitutional: He is oriented to person, place, and time. He appears well-developed and well-nourished.  HENT:  Head: Normocephalic and atraumatic.  Mouth/Throat: Oropharynx is clear and moist.       Bilateral hearing aids  Eyes: Conjunctivae normal and EOM are normal. Pupils are equal, round, and reactive to light.  Neck: Neck supple.  Cardiovascular: Normal rate and regular rhythm.   Respiratory: Effort normal and breath sounds normal.  GI: Bowel sounds are normal.  Genitourinary:       Not pertinent to current  symptomatology therefore not examined.  Musculoskeletal:        Examination of his right knee reveals 1 to 2+ crepitation, 1+ synovitis range of motion is 0-120 degrees knee is stable with normal patella tracking diffuse pain and mild varus deformity. Exam of his left knee reveals full range of motion without pain swelling weakness or instability. Vascular exam: pulses 2+ and symmetric. Blood pressure today is 161/87, pulse is 85.  Neurological: He is alert and oriented to person, place, and time.  Skin: Skin is warm and dry.  Psychiatric: He has a normal mood and affect. His behavior is normal. Judgment  and thought content normal.    XRAY:  End stage DJD right knee  Old tibia fracture.  AP,LATERAL, FLEXION, and SUNRISE views show significant joint space narrowing, with periarticular osteophytes, and subchondral sclerosis.  Assessment Patient Active Problem List  Diagnosis  . History of ischemic vertebrobasilar artery thalamic stroke  . Patent foramen ovale  . DVT (deep venous thrombosis)  . Hypertension  . Hearing deficit  . Right knee DJD   Plan I talk to him and his wife about this in detail. His blood pressure still needs to be decreased somewhat and I recommended they see Dr. Sherrie Mustache for evaluation of this. I do think we need to have him cleared preoperatively by Dr. Sherrie Mustache. Discussed risks benefits and possible complications of the surgery in detail and they understand this completely.   Orlandus Borowski J 01/14/2012, 3:33 PM

## 2012-01-26 NOTE — Preoperative (Signed)
Beta Blockers   Reason not to administer Beta Blockers:Not Applicable 

## 2012-01-26 NOTE — Consult Note (Signed)
ANTICOAGULATION CONSULT - COUMADIN  Pharmacy Consult for Coumadin  HPI: Chase [name] is a  61 year old gentleman with history of CVA more than 10 years ago with DVT.  A previous hypercoagulable workup at [facility] was unrevealing with recommendation made by hematology for indefinite anticoagulation.  The patient has been on Coumadin for > 10 yrs.  He was on a Lovenox bridge prior to surgery.  Coumadin with Lovenox bridging to be restarted post-op.  Allergies: No Known Allergies  Height/Weight: Height: 5' 10.47" (179 cm) Weight: 216 lb 4.3 oz (98.1 kg) IBW/kg (Calculated) : 74.09   Vitals: Blood pressure 140/74, pulse 76, temperature 99.3 F (37.4 C), temperature source Oral, resp. rate 12, height 5' 10.47" (1.79 m), weight 216 lb 4.3 oz (98.1 kg), SpO2 98.00%.  Current active problems: Principal Problem:  *Right knee DJD Active Problems:  History of ischemic vertebrobasilar artery thalamic stroke  Patent foramen ovale  DVT (deep venous thrombosis)  Hypertension  Hearing deficit  Preoperative cardiovascular examination  Tortuous aorta  History of stroke   Medical / Surgical History: Past Medical History  Diagnosis Date  . History of ischemic vertebrobasilar artery thalamic stroke 2001  . Patent foramen ovale   . DVT (deep venous thrombosis)   . Hypertension   . Hearing deficit   . Right knee DJD   . Stroke 2001  . History of stroke   . Hyperlipidemia   . Clotting disorder    Past Surgical History  Procedure Date  . Knee arthroscopy     right knee  . Fracture surgery     R leg  . Appendectomy     Current Labs:  [facility] 01/26/12 0602  HGB --  HCT --  PLT --  LABPROT 12.5  INR 0.94   No results found for this basename: HEPARINUNFRC   Lab Results  Component Value Date   INR 0.94 01/26/2012   INR 2.28* 01/19/2012   Estimated Creatinine Clearance: 108 ml/min (by C-G formula based on Cr of 0.85).  Pertinent Medication History: Medication Sig  .  amLODipine-benazepril (LOTREL) 5-20 MG per capsule Take 1 capsule by mouth daily.  Marland Kitchen enoxaparin (LOVENOX) 150 MG/ML injection Inject 150 mg into the skin.  . potassium gluconate 595 MG TABS Take 595 mg by mouth daily.  Marland Kitchen venlafaxine XR (EFFEXOR-XR) 150 MG 24 hr capsule Take 150 mg by mouth daily.  Marland Kitchen warfarin (COUMADIN) 5 MG tablet Take 10 mg by mouth daily.  Marland Kitchen amLODipine-benazepril (LOTREL) 10-20 MG per capsule Take 1 capsule by mouth daily.   Scheduled:    . acetaminophen      . amLODipine  5 mg Oral Daily  . benazepril  20 mg Oral Daily  . bisacodyl  10 mg Oral QAC supper  .  ceFAZolin (ANCEF) IV  2 g Intravenous 60 min Pre-Op  .  ceFAZolin (ANCEF) IV  2 g Intravenous Q6H  . docusate sodium  100 mg Oral BID  . enoxaparin (LOVENOX) injection  30 mg Subcutaneous Q12H  . venlafaxine XR  150 mg Oral Daily   Anti-infectives     Start     Dose/Rate Route Frequency Ordered Stop   01/26/12 1300   ceFAZolin (ANCEF) IVPB 2 g/50 mL premix        2 g 100 mL/hr over 30 Minutes Intravenous Every 6 hours 01/26/12 1225 01/27/12 0059   01/26/12 0809   cefUROXime (ZINACEF) injection  Status:  Discontinued          As needed 01/26/12  0809 01/26/12 0937   01/25/12 1536   ceFAZolin (ANCEF) IVPB 2 g/50 mL premix        2 g 100 mL/hr over 30 Minutes Intravenous 60 min pre-op 01/25/12 1537 01/26/12 0738          Assessment:  61 year old gentleman with history of CVA more than 10 years ago with DVT and hypercoagulable disorder of undetermined cause who has been on chronic Coumadin for > 10 yrs s/p R-TKR who is to restart Coumadin post-op.  Patient was on Lovenox bridging prior to surgery and will continue bridging until INR is therapeutic.  Baseline INR 0.94.  Prior to holding Coumadin for surgery, INR was 2.28 on Coumadin 10 mg daily..  Goals:  Target INR of 2-3.  Lovenox bridging until INR at goal.  Plan:  Will give Coumadin 12.5 mg today.    Daily INR's, CBC.  Chase [name], Elisha Headland,  Pharm. D. 01/26/2012, 2:03 PM

## 2012-01-26 NOTE — Progress Notes (Signed)
Orthopedic Tech Progress Note Patient Details:  Chase [name] 1951-01-09 [id number]  CPM Right Knee CPM Right Knee: On Right Knee Flexion (Degrees): 60  Right Knee Extension (Degrees): 0  Additional Comments: trapeze bar   Cammer, Mickie Bail 01/26/2012, 12:24 PM

## 2012-01-26 NOTE — Op Note (Signed)
MRN:     [id number] DOB/AGE:    12/11/1950 / 61 y.o.       OPERATIVE REPORT    DATE OF PROCEDURE:  01/26/2012       PREOPERATIVE DIAGNOSIS:   DJD RIGHT KNEE      There is no height or weight on file to calculate BMI.                                                        POSTOPERATIVE DIAGNOSIS:   degenerative joint disease right knee                                                                      PROCEDURE:  Procedure(s): TOTAL KNEE ARTHROPLASTY Using Depuy Sigma RP implants #6 Femur, #6Tibia, 12.55mm sigma RP bearing, 38 Patella     SURGEON: Tristina Sahagian A    ASSISTANT:  Kirstin Shepperson PA-C   (Present and scrubbed throughout the case, critical for assistance with exposure, retraction, instrumentation, and closure.)         ANESTHESIA: GET with Femoral Nerve Block  DRAINS: foley, 2 medium hemovac in knee   TOURNIQUET TIME:   COMPLICATIONS:  None     SPECIMENS: None   INDICATIONS FOR PROCEDURE: The patient has  DJD RIGHT KNEE, varus deformities, XR shows bone on bone arthritis. Patient has failed all conservative measures including anti-inflammatory medicines, narcotics, attempts at  exercise and weight loss, cortisone injections and viscosupplementation.  Risks and benefits of surgery have been discussed, questions answered.   DESCRIPTION OF PROCEDURE: The patient identified by armband, received  right femoral nerve block and IV antibiotics, in the holding area at [facility]. Patient taken to the operating room, appropriate anesthetic  monitors were attached General endotracheal anesthesia induced with  the patient in supine position, Foley catheter was inserted. Tourniquet  applied high to the operative thigh. Lateral post and foot positioner  applied to the table, the lower extremity was then prepped and draped  in usual sterile fashion from the ankle to the tourniquet. Time-out procedure was performed. The limb was wrapped with an Esmarch bandage and the  tourniquet inflated to 365 mmHg. We began the operation by making the anterior midline incision starting at handbreadth above the patella going over the patella 1 cm medial to and  4 cm distal to the tibial tubercle. Small bleeders in the skin and the  subcutaneous tissue identified and cauterized. Transverse retinaculum was incised and reflected medially and a medial parapatellar arthrotomy was accomplished. the patella was everted and theprepatellar fat pad resected. The superficial medial collateral  ligament was then elevated from anterior to posterior along the proximal  flare of the tibia and anterior half of the menisci resected. The knee was hyperflexed exposing bone on bone arthritis. Peripheral and notch osteophytes as well as the cruciate ligaments were then resected. We continued to  work our way around posteriorly along the proximal tibia, and externally  rotated the tibia subluxing it out from underneath the femur. A McHale  retractor was placed through the notch and  a lateral Hohmann retractor  placed, and we then drilled through the proximal tibia in line with the  axis of the tibia followed by an intramedullary guide rod and 2-degree  posterior slope cutting guide. The tibial cutting guide was pinned into place  allowing resection of 4 mm of bone medially and about 8 mm of bone  laterally because of her varus deformity. Satisfied with the tibial resection, we then  entered the distal femur 2 mm anterior to the PCL origin with the  intramedullary guide rod and applied the distal femoral cutting guide  set at 11mm, with 5 degrees of valgus. This was pinned along the  epicondylar axis. At this point, the distal femoral cut was accomplished without difficulty. We then sized for a #6 femoral component and pinned the guide in 3 degrees of external rotation.The chamfer cutting guide was pinned into place. The anterior, posterior, and chamfer cuts were accomplished without difficulty  followed by  the Sigma RP box cutting guide and the box cut. We also removed posterior osteophytes from the posterior femoral condyles. At this  time, the knee was brought into full extension. We checked our  extension and flexion gaps and found them symmetric at 12.20mm.  The patella thickness measured at 30 mm. We set the cutting guide at 20 and removed the posterior 9.5-10 mm  of the patella sized for 38 button and drilled the lollipop. The knee  was then once again hyperflexed exposing the proximal tibia. We sized for a #6 tibial base plate, applied the smokestack and the conical reamer followed by the the Delta fin keel punch. We then hammered into place the Sigma RP trial femoral component, inserted a 12.5-mm trial bearing, trial patellar button, and took the knee through range of motion from 0-130 degrees. No thumb pressure was required for patellar  tracking. At this point, all trial components were removed, a double batch of DePuy HV cement with 1500 mg of Zinacef was mixed and applied to all bony metallic mating surfaces except for the posterior condyles of the femur itself. In order, we  hammered into place the tibial tray and removed excess cement, the femoral component and removed excess cement, a 12.5-mm Sigma RP bearing  was inserted, and the knee brought to full extension with compression.  The patellar button was clamped into place, and excess cement  removed. While the cement cured the wound was irrigated out with normal saline solution pulse lavage, and medium Hemovac drains were placed.. Ligament stability and patellar tracking were checked and found to be excellent. The tourniquet was then released and hemostasis was obtained with cautery. The parapatellar arthrotomy was closed with  Z lock suture. The subcutaneous tissue with 0 and 2-0 undyed  Vicryl suture, and 4-0 Monocryl.. A dressing of Xeroform,  4 x 4, dressing sponges, Webril, and Ace wrap applied. Needle and sponge count  were correct times 2.The patient awakened, extubated, and taken to recovery room without difficulty. Vascular status was normal, pulses 2+ and symmetric.   Chase [name] A 01/26/2012, 9:12 AM

## 2012-01-26 NOTE — Anesthesia Procedure Notes (Addendum)
Anesthesia Regional Block:  Femoral nerve block  Pre-Anesthetic Checklist: ,, timeout performed, Correct Patient, Correct Site, Correct Laterality, Correct Procedure, Correct Position, site marked, Risks and benefits discussed,  Surgical consent,  Pre-op evaluation,  At surgeon's request and post-op pain management  Laterality: Right  Prep: chloraprep       Needles:   Needle Type: Echogenic Stimulator Needle      Needle Gauge: 22 and 22 G    Additional Needles: Femoral nerve block Narrative:  Start time: 01/26/2012 7:10 AM End time: 01/26/2012 7:15 AM  Performed by: Personally   Additional Notes: 30 cc 0.5% marcaine with 1:200 Epi injected easily.  Kipp Brood, MD  Femoral nerve block Procedure Name: LMA Insertion Date/Time: 01/26/2012 7:37 AM Performed by: Rossie Muskrat L Pre-anesthesia Checklist: Patient identified, Timeout performed, Emergency Drugs available, Suction available and Patient being monitored Patient Re-evaluated:Patient Re-evaluated prior to inductionOxygen Delivery Method: Circle system utilized Preoxygenation: Pre-oxygenation with 100% oxygen Intubation Type: IV induction LMA: LMA inserted LMA Size: 5.0 Number of attempts: 1 Placement Confirmation: breath sounds checked- equal and bilateral and positive ETCO2 Tube secured with: Tape Dental Injury: Teeth and Oropharynx as per pre-operative assessment

## 2012-01-26 NOTE — Anesthesia Preprocedure Evaluation (Signed)
Anesthesia Evaluation  Patient identified by MRN, date of birth, ID band Patient awake    Reviewed: Allergy & Precautions, H&P , NPO status , Patient's Chart, lab work & pertinent test results  Airway Mallampati: II      Dental  (+) Teeth Intact   Pulmonary  breath sounds clear to auscultation        Cardiovascular Rhythm:Regular Rate:Normal     Neuro/Psych    GI/Hepatic   Endo/Other    Renal/GU      Musculoskeletal   Abdominal (+)  Abdomen: soft.    Peds  Hematology   Anesthesia Other Findings   Reproductive/Obstetrics                           Anesthesia Physical Anesthesia Plan  ASA: III  Anesthesia Plan: General   Post-op Pain Management:    Induction: Intravenous  Airway Management Planned: LMA  Additional Equipment:   Intra-op Plan:   Post-operative Plan:   Informed Consent: I have reviewed the patients History and Physical, chart, labs and discussed the procedure including the risks, benefits and alternatives for the proposed anesthesia with the patient or authorized representative who has indicated his/her understanding and acceptance.     Plan Discussed with: CRNA and Surgeon  Anesthesia Plan Comments: (DJD R. Knee Htn PFO S/P thalamic stroke  Plan GA with femoral nerve block  Kipp Brood, MD)        Anesthesia Quick Evaluation

## 2012-01-26 NOTE — Interval H&P Note (Signed)
History and Physical Interval Note:  01/26/2012 7:08 AM  Chase [name]  has presented today for surgery, with the diagnosis of DJD RIGHT KNEE  The various methods of treatment have been discussed with the patient and family. After consideration of risks, benefits and other options for treatment, the patient has consented to  Procedure(s) (LRB) with comments: TOTAL KNEE ARTHROPLASTY (Right) as a surgical intervention .  The patient's history has been reviewed, patient examined, no change in status, stable for surgery.  I have reviewed the patient's chart and labs.  Questions were answered to the patient's satisfaction.     Salvatore Marvel A

## 2012-01-26 NOTE — Anesthesia Postprocedure Evaluation (Signed)
  Anesthesia Post-op Note  [name]  Procedure(s) Performed: Procedure(s) (LRB) with comments: TOTAL KNEE ARTHROPLASTY (Right)  Patient Location: PACU  Anesthesia Type: General  Level of Consciousness: awake, alert  and oriented  Airway and Oxygen Therapy: Patient Spontanous Breathing and Patient connected to nasal cannula oxygen  Post-op Pain: mild  Post-op Assessment: Post-op Vital signs reviewed and Patient's Cardiovascular Status Stable  Post-op Vital Signs: stable  Complications: No apparent anesthesia complications

## 2012-01-26 NOTE — Transfer of Care (Signed)
Immediate Anesthesia Transfer of Care Note  [name]  Procedure(s) Performed: Procedure(s) (LRB) with comments: TOTAL KNEE ARTHROPLASTY (Right)  Patient Location: PACU  Anesthesia Type: General  Level of Consciousness: awake, alert  and oriented  Airway & Oxygen Therapy: Patient Spontanous Breathing and Patient connected to nasal cannula oxygen  Post-op Assessment: Report given to PACU RN, Post -op Vital signs reviewed and stable and Patient moving all extremities  Post vital signs: Reviewed and stable  Complications: No apparent anesthesia complications

## 2012-01-27 ENCOUNTER — Encounter (HOSPITAL_COMMUNITY): Payer: Self-pay | Admitting: Orthopedic Surgery

## 2012-01-27 LAB — CBC
HCT: 33.3 % — ABNORMAL LOW (ref 39.0–52.0)
Hemoglobin: 11.8 g/dL — ABNORMAL LOW (ref 13.0–17.0)
MCH: 33.4 pg (ref 26.0–34.0)
MCV: 94.3 fL (ref 78.0–100.0)
RBC: 3.53 MIL/uL — ABNORMAL LOW (ref 4.22–5.81)

## 2012-01-27 LAB — BASIC METABOLIC PANEL
BUN: 10 mg/dL (ref 6–23)
CO2: 24 mEq/L (ref 19–32)
Calcium: 8.4 mg/dL (ref 8.4–10.5)
Creatinine, Ser: 0.93 mg/dL (ref 0.50–1.35)
Glucose, Bld: 123 mg/dL — ABNORMAL HIGH (ref 70–99)

## 2012-01-27 MED ORDER — PNEUMOCOCCAL VAC POLYVALENT 25 MCG/0.5ML IJ INJ
0.5000 mL | INJECTION | INTRAMUSCULAR | Status: AC
  Administered 2012-01-27: 0.5 mL via INTRAMUSCULAR
  Filled 2012-01-27: qty 0.5

## 2012-01-27 MED ORDER — COUMADIN BOOK
1.0000 | Freq: Once | Status: DC
  Filled 2012-01-27: qty 1

## 2012-01-27 MED ORDER — LORAZEPAM 1 MG PO TABS
1.0000 mg | ORAL_TABLET | Freq: Four times a day (QID) | ORAL | Status: DC | PRN
  Administered 2012-01-27: 1 mg via ORAL
  Filled 2012-01-27: qty 1

## 2012-01-27 MED ORDER — LORAZEPAM 1 MG PO TABS: 1.0000 mg | ORAL_TABLET | Freq: Three times a day (TID) | ORAL | Status: DC | PRN

## 2012-01-27 MED ORDER — OXYCODONE HCL 20 MG PO TB12: 20.0000 mg | ORAL_TABLET | Freq: Two times a day (BID) | ORAL | Status: DC

## 2012-01-27 MED ORDER — OXYCODONE HCL 5 MG PO TABS: 15.0000 mg | ORAL_TABLET | ORAL | Status: DC | PRN

## 2012-01-27 MED ORDER — INFLUENZA VIRUS VACC SPLIT PF IM SUSP
0.5000 mL | INTRAMUSCULAR | Status: DC
  Filled 2012-01-27: qty 0.5

## 2012-01-27 MED ORDER — OXYCODONE HCL 10 MG PO TB12
20.0000 mg | ORAL_TABLET | Freq: Two times a day (BID) | ORAL | Status: DC
  Administered 2012-01-27: 20 mg via ORAL
  Filled 2012-01-27: qty 2

## 2012-01-27 MED ORDER — SODIUM CHLORIDE 0.9 % IV BOLUS (SEPSIS)
500.0000 mL | Freq: Once | INTRAVENOUS | Status: AC
  Administered 2012-01-27: 500 mL via INTRAVENOUS

## 2012-01-27 MED ORDER — DSS 100 MG PO CAPS: 100.0000 mg | ORAL_CAPSULE | Freq: Two times a day (BID) | ORAL | Status: DC

## 2012-01-27 MED ORDER — MAGNESIUM HYDROXIDE 400 MG/5ML PO SUSP
30.0000 mL | Freq: Every day | ORAL | Status: DC
  Administered 2012-01-27: 30 mL via ORAL
  Filled 2012-01-27: qty 30

## 2012-01-27 MED ORDER — WARFARIN SODIUM 7.5 MG PO TABS
15.0000 mg | ORAL_TABLET | Freq: Once | ORAL | Status: DC
  Filled 2012-01-27: qty 2

## 2012-01-27 MED ORDER — SPIRITUS FRUMENTI
1.0000 | Freq: Two times a day (BID) | ORAL | Status: DC
  Administered 2012-01-27: 1 via ORAL
  Filled 2012-01-27 (×2): qty 1

## 2012-01-27 MED ORDER — PNEUMOCOCCAL VAC POLYVALENT 25 MCG/0.5ML IJ INJ
0.5000 mL | INJECTION | Freq: Once | INTRAMUSCULAR | Status: DC
  Filled 2012-01-27: qty 0.5

## 2012-01-27 MED ORDER — ENOXAPARIN SODIUM 30 MG/0.3ML ~~LOC~~ SOLN: 30.0000 mg | Freq: Two times a day (BID) | SUBCUTANEOUS | Status: DC

## 2012-01-27 MED ORDER — WARFARIN VIDEO
1.0000 | Freq: Once | Status: DC
Start: 2012-01-27 — End: 2012-01-27

## 2012-01-27 MED ORDER — INFLUENZA VIRUS VACC SPLIT PF IM SUSP
0.5000 mL | INTRAMUSCULAR | Status: AC
  Administered 2012-01-27: 0.5 mL via INTRAMUSCULAR
  Filled 2012-01-27: qty 0.5

## 2012-01-27 MED ORDER — ENOXAPARIN SODIUM 30 MG/0.3ML ~~LOC~~ SOLN
30.0000 mg | SUBCUTANEOUS | Status: AC
  Administered 2012-01-27: 30 mg via SUBCUTANEOUS
  Filled 2012-01-27 (×2): qty 0.3

## 2012-01-27 NOTE — Discharge Summary (Signed)
Patient ID: Chase [name] MRN: [id number] DOB/AGE: Apr 02, 1951 61 y.o.  Admit date: February 25, 2012 Discharge date: 01/27/2012  Admission Diagnoses:  Principal Problem:  *Right knee DJD Active Problems:  History of ischemic vertebrobasilar artery thalamic stroke  Patent foramen ovale  DVT (deep venous thrombosis)  Hypertension  Hearing deficit  Preoperative cardiovascular examination  Tortuous aorta  History of stroke   Discharge Diagnoses:  Principal Problem:  *Right knee DJD Active Problems:  History of ischemic vertebrobasilar artery thalamic stroke  Patent foramen ovale  DVT (deep venous thrombosis)  Hypertension  Hearing deficit  Preoperative cardiovascular examination  Tortuous aorta  History of stroke post operative acute blood loss anemia  Past Medical History  Diagnosis Date  . History of ischemic vertebrobasilar artery thalamic stroke 2001  . Patent foramen ovale   . DVT (deep venous thrombosis)   . Hypertension   . Hearing deficit   . Right knee DJD   . Stroke 2001  . History of stroke   . Hyperlipidemia   . Clotting disorder     Surgeries: Procedure(s): TOTAL KNEE ARTHROPLASTY on 02/25/12   Consultants:  none  Discharged Condition: Improved  Hospital Course: KAILEN HINKLE is an 61 y.o. male who was admitted 02-25-2012 for operative treatment ofRight knee DJD. Patient has severe unremitting pain that affects sleep, daily activities, and work/hobbies. After pre-op clearance the patient was taken to the operating room on 2012-02-25 and underwent  Procedure(s): TOTAL KNEE ARTHROPLASTY.    Patient was given perioperative antibiotics: Anti-infectives     Start     Dose/Rate Route Frequency Ordered Stop   Feb 25, 2012 1300   ceFAZolin (ANCEF) IVPB 2 g/50 mL premix        2 g 100 mL/hr over 30 Minutes Intravenous Every 6 hours 2012/02/25 1225 February 25, 2012 2028   25-Feb-2012 0809   cefUROXime (ZINACEF) injection  Status:  Discontinued          As needed 2012/02/25  0809 02/25/2012 0937   01/25/12 1536   ceFAZolin (ANCEF) IVPB 2 g/50 mL premix        2 g 100 mL/hr over 30 Minutes Intravenous 60 min pre-op 01/25/12 1537 02/25/2012 0738           Patient was given sequential compression devices, early ambulation, and chemoprophylaxis to prevent DVT.  Patient benefited maximally from hospital stay and there were no complications.    Recent vital signs: Patient Vitals for the past 24 hrs:  BP Temp Pulse Resp SpO2  01/27/12 0631 137/79 mmHg 98.7 F (37.1 C) 76  16  99 %  01/27/12 0400 - - - 20  96 %  01/27/12 0313 128/75 mmHg 99.5 F (37.5 C) 78  16  97 %  01/27/12 0000 - - - 20  98 %  February 25, 2012 2100 123/80 mmHg 99.8 F (37.7 C) 79  16  97 %  02-25-2012 2000 - - - 20  95 %     Recent laboratory studies:  Basename 01/27/12 0612 2012/02/25 0602  WBC 8.0 --  HGB 11.8* --  HCT 33.3* --  PLT 128* --  NA 136 --  K 4.2 --  CL 101 --  CO2 24 --  BUN 10 --  CREATININE 0.93 --  GLUCOSE 123* --  INR 1.04 0.94  CALCIUM 8.4 --     Discharge Medications:     Medication List     As of 01/27/2012  2:34 PM    STOP taking these medications  amLODipine-benazepril 5-20 MG per capsule   Commonly known as: LOTREL      enoxaparin 150 MG/ML injection   Commonly known as: LOVENOX      TAKE these medications         amLODipine-benazepril 10-20 MG per capsule   Commonly known as: LOTREL   Take 1 capsule by mouth daily.      DSS 100 MG Caps   Take 100 mg by mouth 2 (two) times daily.      enoxaparin 30 MG/0.3ML injection   Commonly known as: LOVENOX   Inject 0.3 mLs (30 mg total) into the skin every 12 (twelve) hours.      LORazepam 1 MG tablet   Commonly known as: ATIVAN   Take 1 tablet (1 mg total) by mouth every 8 (eight) hours as needed for anxiety.      oxyCODONE 5 MG immediate release tablet   Commonly known as: Oxy IR/ROXICODONE   Take 3 tablets (15 mg total) by mouth every 3 (three) hours as needed for pain.      oxyCODONE 20  MG 12 hr tablet   Commonly known as: OXYCONTIN   Take 1 tablet (20 mg total) by mouth every 12 (twelve) hours.      potassium gluconate 595 MG Tabs   Take 595 mg by mouth daily.      venlafaxine XR 150 MG 24 hr capsule   Commonly known as: EFFEXOR-XR   Take 150 mg by mouth daily.      warfarin 5 MG tablet   Commonly known as: COUMADIN   Take 10 mg by mouth daily.        Diagnostic Studies: Dg Chest 2 View  01/19/2012  *RADIOLOGY REPORT*  Clinical Data: Preoperative radiograph, smoker.  CHEST - 2 VIEW  Comparison: None.  Findings: Increased AP diameter.  Interstitial coarsening. Prominent aortic knob contour.  No focal airspace opacity.  No pleural effusion or pneumothorax.  Mild multilevel degenerative changes.  IMPRESSION: Increased AP diameter and interstitial coarsening may reflect underlying COPD.  Prominent aortic knob may be secondary to aortic dilatation or tortuosity.    This can be further characterized with CTA chest or echocardiogram.   Original Report Authenticated By: Waneta Martins, M.D.     Disposition: Final discharge disposition not confirmed      Discharge Orders    Future Orders Please Complete By Expires   Diet - low sodium heart healthy      Call MD / Call 911      Comments:   If you experience chest pain or shortness of breath, CALL 911 and be transported to the hospital emergency room.  If you develope a fever above 101 F, pus (white drainage) or increased drainage or redness at the wound, or calf pain, call your surgeon's office.   Constipation Prevention      Comments:   Drink plenty of fluids.  Prune juice may be helpful.  You may use a stool softener, such as Colace (over the counter) 100 mg twice a day.  Use MiraLax (over the counter) for constipation as needed.   Increase activity slowly as tolerated      Discharge instructions      Comments:   Total Knee Replacement Care After Refer to this sheet in the next few weeks. These discharge  instructions provide you with general information on caring for yourself after you leave the hospital. Your caregiver may also give you specific instructions. Your treatment has been  planned according to the most current medical practices available, but unavoidable complications sometimes occur. If you have any problems or questions after discharge, please call your caregiver. Regaining a near full range of motion of your knee within the first 3 to 6 weeks after surgery is critical. HOME CARE INSTRUCTIONS  You may resume a normal diet and activities as directed.  Perform exercises as directed.  Place yellow foam block, yellow side up under heel at all times except when in CPM or when walking.  DO NOT modify, tear, cut, or change in any way. You will receive physical therapy daily  Take showers instead of baths until informed otherwise.  Change bandages (dressings)daily Do not take over-the-counter or prescription medicines for pain, discomfort, or fever. Eat a well-balanced diet.  Avoid lifting or driving until you are instructed otherwise.  Make an appointment to see your caregiver for stitches (suture) or staple removal as directed.  If you have been sent home with a continuous passive motion machine (CPM machine), 0-90 degrees 6 hrs a day   2 hrs a shift SEEK MEDICAL CARE IF: You have swelling of your calf or leg.  You develop shortness of breath or chest pain.  You have redness, swelling, or increasing pain in the wound.  There is pus or any unusual drainage coming from the surgical site.  You notice a bad smell coming from the surgical site or dressing.  The surgical site breaks open after sutures or staples have been removed.  There is persistent bleeding from the suture or staple line.  You are getting worse or are not improving.  You have any other questions or concerns.  SEEK IMMEDIATE MEDICAL CARE IF:  You have a fever.  You develop a rash.  You have difficulty breathing.  You  develop any reaction or side effects to medicines given.  Your knee motion is decreasing rather than improving.  MAKE SURE YOU:  Understand these instructions.  Will watch your condition.  Will get help right away if you are not doing well or get worse.   CPM      Comments:   Continuous passive motion machine (CPM):      Use the CPM from 0 to 90 for 6 hours per day.       You may break it up into 2 or 3 sessions per day.      Use CPM for 2 weeks or until you are told to stop.   TED hose      Comments:   Use stockings (TED hose) for 2 weeks on both leg(s).  You may remove them at night for sleeping.   Change dressing      Comments:   Change the dressing daily with sterile 4 x 4 inch gauze dressing and apply TED hose.  You may clean the incision with alcohol prior to redressing.   Do not put a pillow under the knee. Place it under the heel.      Comments:   Place yellow foam block, yellow side up under heel at all times except when in CPM or when walking.  DO NOT modify, tear, cut, or change in any way the yellow foam block.      Follow-up Information    Follow up with Nilda Simmer, MD. On 02/09/2012. (appt time 3:45)    Contact information:   [facility] [address] [place] [address] [phone]  SignedPascal Lux 01/27/2012, 2:34 PM

## 2012-01-27 NOTE — Progress Notes (Signed)
Occupational Therapy Evaluation Patient Details Name: Chase [name] MRN: [id number] DOB: 1951/04/06 Today's Date: 01/27/2012 Time: 4098-1191 OT Time Calculation (min): 48 min  OT Assessment / Plan / Recommendation Clinical Impression  Pt. 62 yo male s/p right TKA. Pt. very impulsive and seamed to rush through tasks during session. Required verbal cues to slow down and wait until it was safe to transfer/ambulate while completing ADL's. Pt. would benefit from OT acutely to address safety with ADL's and to address LB dressing.     OT Assessment  Patient needs continued OT Services    Follow Up Recommendations  Supervision/Assistance - 24 hour    Barriers to Discharge      Equipment Recommendations  None recommended by OT    Recommendations for Other Services    Frequency  Min 2X/week    Precautions / Restrictions Precautions Precautions: Knee Precaution Booklet Issued: No Required Braces or Orthoses: Knee Immobilizer - Right Knee Immobilizer - Right: Discontinue post op day 2;On at all times Restrictions Weight Bearing Restrictions: Yes RLE Weight Bearing: Weight bearing as tolerated   Pertinent Vitals/Pain Pt. Reports pain 10/10. Nursing notified and will receive meds. once back from therapy    ADL  Eating/Feeding: Performed;Set up Where Assessed - Eating/Feeding: Chair Grooming: Performed;Wash/dry hands;Wash/dry face;Teeth care;Set up Where Assessed - Grooming: Unsupported standing Upper Body Dressing: Performed;Supervision/safety Where Assessed - Upper Body Dressing: Supported standing Lower Body Dressing: Performed;Minimal assistance Where Assessed - Lower Body Dressing: Supported sit to stand Toilet Transfer: Performed;Min guard Statistician Method: Sit to Barista: Regular height toilet;Grab bars Tub/Shower Transfer: Performed;Minimal Passenger transport manager: Counsellor Used: Knee Immobilizer;Rolling  walker;Gait belt Transfers/Ambulation Related to ADLs: Pt. very impulsive throughout session. Required verbal cues for RLE placement when transfering. Instructed pt to push RW not to lift up with it while ambulating  ADL Comments: Pt. very impulsive and trying to rush through session. Required verbal cues to slow down and wait before moving to next task. Pt. able to transfer to toilet with min guard and verbal cues for safest hand placement. Pt. completed tub transfer using tub bench with min (A) to help lift RLE over tub. Pt. kept wanting to stand during tub bench transfer and required cueing to sit back down for his safety.       OT Diagnosis: Generalized weakness;Acute pain  OT Problem List: Decreased activity tolerance;Decreased safety awareness;Decreased knowledge of precautions;Decreased knowledge of use of DME or AE OT Treatment Interventions: Self-care/ADL training;Therapeutic exercise;DME and/or AE instruction;Therapeutic activities;Patient/family education   OT Goals Acute Rehab OT Goals OT Goal Formulation: With patient Time For Goal Achievement: 02/10/12 Potential to Achieve Goals: Good ADL Goals Pt Will Perform Grooming: with modified independence;Standing at sink ADL Goal: Grooming - Progress: Goal set today Pt Will Perform Lower Body Dressing: with modified independence;Sit to stand from chair ADL Goal: Lower Body Dressing - Progress: Goal set today Pt Will Transfer to Toilet: with supervision;Regular height toilet ADL Goal: Toilet Transfer - Progress: Goal set today Pt Will Perform Toileting - Clothing Manipulation: with supervision;Sitting on 3-in-1 or toilet ADL Goal: Toileting - Clothing Manipulation - Progress: Goal set today Pt Will Perform Toileting - Hygiene: with supervision;Sit to stand from 3-in-1/toilet ADL Goal: Toileting - Hygiene - Progress: Goal set today  Visit Information  Last OT Received On: 01/27/12 Assistance Needed: +1    Subjective Data   Subjective: Just let me do what I gotta do Patient Stated Goal: Go home  Prior Functioning     Home Living Lives With: Spouse Available Help at Discharge: Family;Available 24 hours/day;Friend(s) Type of Home: House Home Access: Stairs to enter [employer] of Steps: 2 Entrance Stairs-Rails: None Home Layout: One level Bathroom Shower/Tub: Tub only Firefighter: Standard Home Adaptive Equipment: Bedside commode/3-in-1;Walker - rolling;Straight cane Prior Function Level of Independence: Independent Able to Take Stairs?: Yes Driving: Yes Communication Communication: No difficulties         Vision/Perception     Cognition  Overall Cognitive Status: Appears within functional limits for tasks assessed/performed Arousal/Alertness: Awake/alert Orientation Level: Appears intact for tasks assessed Behavior During Session: [facility] for tasks performed    Extremity/Trunk Assessment Right Upper Extremity Assessment RUE ROM/Strength/Tone: [facility] for tasks assessed Left Upper Extremity Assessment LUE ROM/Strength/Tone: WFL for tasks assessed Trunk Assessment Trunk Assessment: Normal     Mobility Bed Mobility Bed Mobility: Not assessed Transfers Transfers: Sit to Stand;Stand to Sit Sit to Stand: 4: Min guard;From chair/3-in-1;With armrests;With upper extremity assist Stand to Sit: 4: Min guard;With upper extremity assist;To chair/3-in-1;With armrests Details for Transfer Assistance: Cues for safest hand placement and to extend RLE when completing transfers. Pt. impulsive to stand up before RW is in place for support                    End of Session OT - End of Session Equipment Utilized During Treatment: Gait belt;Right knee immobilizer Activity Tolerance: Patient tolerated treatment well Patient left: in chair;with call bell/phone within reach;with family/visitor present Nurse Communication: Mobility status CPM Right Knee CPM Right Knee: Off  GO      Cleora Fleet 01/27/2012, 12:21 PM

## 2012-01-27 NOTE — Evaluation (Signed)
Physical Therapy Evaluation Patient Details Name: Chase [name] MRN: [id number] DOB: [birth year] Today's Date: 01/27/2012 Time: 1478-2956 PT Time Calculation (min): 29 min  PT Assessment / Plan / Recommendation Clinical Impression  Pt is a 61 y/o male admitted s/p right TKA along with the below PT problem list.  Pt would benefit from acute PT to maximize independence and facilitate d/c home with HHPT.    PT Assessment  Patient needs continued PT services    Follow Up Recommendations  Home health PT    Barriers to Discharge None      Equipment Recommendations  None recommended by PT    Recommendations for Other Services     Frequency 7X/week    Precautions / Restrictions Precautions Precautions: Knee Precaution Booklet Issued: No Required Braces or Orthoses: Knee Immobilizer - Right Knee Immobilizer - Right: Discontinue post op day 2;On at all times Restrictions Weight Bearing Restrictions: Yes RLE Weight Bearing: Weight bearing as tolerated   Pertinent Vitals/Pain 2/10 in right knee.  Pt repositioned.      Mobility  Bed Mobility Bed Mobility: Supine to Sit Supine to Sit: 4: Min assist Details for Bed Mobility Assistance: Assist for right LE due to pain with cues for sequence. Transfers Transfers: Sit to Stand;Stand to Sit Sit to Stand: 4: Min assist;With upper extremity assist;From bed Stand to Sit: 4: Min assist;With upper extremity assist;To chair/3-in-1 Details for Transfer Assistance: Assist for balance and to off weight right LE due to pain.  Cues for safest hand/right LE placement. Ambulation/Gait Ambulation/Gait Assistance: 4: Min assist Ambulation Distance (Feet): 100 Feet Assistive device: Rolling walker Ambulation/Gait Assistance Details: Assist for balance and to off weight right LE due to pain.  Cues for tall posture and safe sequence. Gait Pattern: Step-to pattern;Decreased step length - right;Decreased stance time - right;Trunk flexed Stairs:  No Wheelchair Mobility Wheelchair Mobility: No    Shoulder Instructions     Exercises Total Joint Exercises Ankle Circles/Pumps: AROM;Right;10 reps;Supine Quad Sets: AROM;Right;10 reps;Supine Heel Slides: AAROM;Right;10 reps;Supine   PT Diagnosis: Difficulty walking;Acute pain  PT Problem List: Decreased strength;Decreased range of motion;Decreased activity tolerance;Decreased balance;Decreased mobility;Decreased knowledge of use of DME;Decreased knowledge of precautions;Pain PT Treatment Interventions: DME instruction;Gait training;Functional mobility training;Therapeutic activities;Balance training;Patient/family education;Therapeutic exercise;Stair training   PT Goals Acute Rehab PT Goals PT Goal Formulation: With patient/family Time For Goal Achievement: 02/03/12 Potential to Achieve Goals: Good Pt will go Supine/Side to Sit: with modified independence PT Goal: Supine/Side to Sit - Progress: Goal set today Pt will go Sit to Supine/Side: with modified independence PT Goal: Sit to Supine/Side - Progress: Goal set today Pt will go Sit to Stand: with modified independence PT Goal: Sit to Stand - Progress: Goal set today Pt will go Stand to Sit: with modified independence PT Goal: Stand to Sit - Progress: Goal set today Pt will Ambulate: >150 feet;with modified independence;with least restrictive assistive device PT Goal: Ambulate - Progress: Goal set today Pt will Go Up / Down Stairs: 1-2 stairs;with min assist;with least restrictive assistive device PT Goal: Up/Down Stairs - Progress: Goal set today Pt will Perform Home Exercise Program: Independently PT Goal: Perform Home Exercise Program - Progress: Goal set today  Visit Information  Last PT Received On: 01/27/12 Assistance Needed: +1    Subjective Data  Subjective: "I was in terrible pain this morning." Patient Stated Goal: Decrease pain.  Go home.   Prior Functioning  Home Living Lives With: Spouse Available Help at  Discharge: Family;Available 24 hours/day;Friend(s) Type  of Home: House Home Access: Stairs to enter [employer] of Steps: 2 Entrance Stairs-Rails: None Home Layout: One level Bathroom Shower/Tub: Tub only Firefighter: Standard Home Adaptive Equipment: Bedside commode/3-in-1;Walker - rolling;Straight cane Prior Function Level of Independence: Independent Able to Take Stairs?: Yes Driving: Yes Communication Communication: No difficulties    Cognition  Overall Cognitive Status: Appears within functional limits for tasks assessed/performed Arousal/Alertness: Awake/alert Orientation Level: Appears intact for tasks assessed Behavior During Session: [facility] for tasks performed    Extremity/Trunk Assessment Right Upper Extremity Assessment RUE ROM/Strength/Tone: Within functional levels RUE Sensation: WFL - Light Touch RUE Coordination: WFL - gross motor Left Upper Extremity Assessment LUE ROM/Strength/Tone: Within functional levels LUE Sensation: WFL - Light Touch LUE Coordination: WFL - gross motor Right Lower Extremity Assessment RLE ROM/Strength/Tone: Deficits;Due to pain RLE ROM/Strength/Tone Deficits: 2/5 throughout.  AA/ROM 0-50 degrees. RLE Sensation: WFL - Light Touch RLE Coordination: WFL - gross motor Left Lower Extremity Assessment LLE ROM/Strength/Tone: Within functional levels LLE Sensation: WFL - Light Touch LLE Coordination: WFL - gross motor Trunk Assessment Trunk Assessment: Normal   Balance Balance Balance Assessed: No  End of Session PT - End of Session Equipment Utilized During Treatment: Gait belt;Right knee immobilizer Activity Tolerance: Patient tolerated treatment well Patient left: in chair;with call bell/phone within reach;with family/visitor present Nurse Communication: Mobility status CPM Right Knee CPM Right Knee: Off  GP     Cephus Shelling 01/27/2012, 8:40 AM  01/27/2012 Cephus Shelling, PT, DPT [phone]

## 2012-01-27 NOTE — Progress Notes (Signed)
CARE MANAGEMENT NOTE 01/27/2012  Patient:  Chase [name], Chase [name]   Account Number:  [id number]  Date Initiated:  01/27/2012  Documentation initiated by:  Vance Peper  Subjective/Objective Assessment:   01/26/12  61 yr old male s/p right total knee arthroplasty.     Action/Plan:   Patient preoperatively setup with [facility], no changes. Rolling walker, 3in1 and CPM have been delivered to patient's home.   Anticipated DC Date:  01/27/2012   Anticipated DC Plan:  HOME W HOME HEALTH SERVICES      DC Planning Services  CM consult      [facility] Choice  HOME HEALTH   Choice offered to / List presented to:          [facility] arranged  HH-1 RN  HH-2 PT      [facility] agency  [facility]   Status of service:  Completed, signed off Medicare Important Message given?   (If response is "NO", the following Medicare IM given date fields will be blank) Date Medicare IM given:   Date Additional Medicare IM given:    Discharge Disposition:  HOME W HOME HEALTH SERVICES  Per UR Regulation:    If discussed at Long Length of Stay Meetings, dates discussed:    Comments:

## 2012-01-27 NOTE — Progress Notes (Signed)
Patient ID: Chase [name], male   DOB: 1950/11/22, 61 y.o.   MRN: [id number] PATIENT ID: Chase [name]  MRN: [id number]  DOB/AGE:  1951-01-13 / 61 y.o.  1 Day Post-Op Procedure(s) (LRB): TOTAL KNEE ARTHROPLASTY (Right)    PROGRESS NOTE Subjective: Patient is alert, oriented, no Nausea, no Vomiting, yes passing gas, no Bowel Movement. Taking PO well. Denies SOB, Chest or Calf Pain. Using Incentive Spirometer, PAS in place. Ambulate well, CPM 0-60 Patient reports pain as 4 on 0-10 scale  .    Objective: Vital signs in last 24 hours: Filed Vitals:   01/27/12 0000 01/27/12 0313 01/27/12 0400 01/27/12 0631  BP:  128/75  137/79  Pulse:  78  76  Temp:  99.5 F (37.5 C)  98.7 F (37.1 C)  TempSrc:      Resp: 20 16 20 16   Height:      Weight:      SpO2: 98% 97% 96% 99%      Intake/Output from previous day: I/O last 3 completed shifts: In: 1300 [I.V.:1300] Out: 2375 [Urine:1700; Drains:625; Blood:50]   Intake/Output this shift:     LABORATORY DATA:  Basename 01/27/12 0612 01/26/12 0602  WBC 8.0 --  HGB 11.8* --  HCT 33.3* --  PLT 128* --  NA 136 --  K 4.2 --  CL 101 --  CO2 24 --  BUN 10 --  CREATININE 0.93 --  GLUCOSE 123* --  GLUCAP -- --  INR 1.04 0.94  CALCIUM 8.4 --    Examination: Neurologically intact ABD soft Neurovascular intact Sensation intact distally Intact pulses distally Dorsiflexion/Plantar flexion intact Incision: dressing C/D/I}  Assessment:   1 Day Post-Op Procedure(s) (LRB): TOTAL KNEE ARTHROPLASTY (Right) ADDITIONAL DIAGNOSIS:  Acute Blood Loss Anemia Principal Problem:  *Right knee DJD Active Problems:  History of ischemic vertebrobasilar artery thalamic stroke  Patent foramen ovale  DVT (deep venous thrombosis)  Hypertension  Hearing deficit  Preoperative cardiovascular examination  Tortuous aorta  History of stroke   Plan: PT/OT WBAT, CPM 5/hrs day until ROM 0-90 degrees, then D/C CPM DVT Prophylaxis:  SCDx72hrs, ASA 325  mg BID x 2 weeks DISCHARGE PLAN: Home DISCHARGE NEEDS: HHPT Fluid bolus for post op blood loss anemia.  Dressing change at noon.  Milk of magnesia.  Possibly home today    Pascal Lux 01/27/2012, 8:37 AM

## 2012-01-27 NOTE — Progress Notes (Signed)
Physical Therapy Treatment Patient Details Name: Chase [name] MRN: [id number] DOB: 1950/06/16 Today's Date: 01/27/2012 Time: 7829-5621 PT Time Calculation (min): 24 min  PT Assessment / Plan / Recommendation Comments on Treatment Session  Pt admitted s/p right TKA and continues to progress with therapy.  Pt able to increase independence with mobility along with increased ambulation distance.  Pt also tolerated stair negotiation this pm.  Ready for safe d/c home once medically cleared by MD.    Follow Up Recommendations  Home health PT    Barriers to Discharge        Equipment Recommendations  None recommended by PT    Recommendations for Other Services    Frequency 7X/week   Plan Discharge plan remains appropriate;Frequency remains appropriate    Precautions / Restrictions Precautions Precautions: Knee Precaution Booklet Issued: No Required Braces or Orthoses: Knee Immobilizer - Right Knee Immobilizer - Right: Discontinue post op day 2 Restrictions Weight Bearing Restrictions: Yes RLE Weight Bearing: Weight bearing as tolerated   Pertinent Vitals/Pain 3/10 in right knee.  Pt repositioned.    Mobility  Bed Mobility Bed Mobility: Not assessed Transfers Transfers: Sit to Stand;Stand to Sit Sit to Stand: 5: Supervision;With upper extremity assist;From chair/3-in-1 Stand to Sit: 5: Supervision;With upper extremity assist;To chair/3-in-1 Details for Transfer Assistance: Verbal cues for safest hand/right LE placement. Ambulation/Gait Ambulation/Gait Assistance: 4: Min guard Ambulation Distance (Feet): 150 Feet Assistive device: Rolling walker Ambulation/Gait Assistance Details: Guarding for balance with cues for tall posture and safety.  Did not use KI with gait due to wife stating she understood pt not to use KI at all.  Educated pt and wife on order to d/c POD 2 with KI.  Wife sure pt not to wear KI. Gait Pattern: Step-to pattern;Decreased step length - right;Decreased  stance time - right;Trunk flexed Stairs: Yes Stairs Assistance: 4: Min guard Stairs Assistance Details (indicate cue type and reason): Guarding for balance with cues for "up with good, down with bad." Stair Management Technique: Step to pattern;Backwards;With walker Number of Stairs: 1  Wheelchair Mobility Wheelchair Mobility: No    Exercises     PT Diagnosis:    PT Problem List:   PT Treatment Interventions:     PT Goals Acute Rehab PT Goals PT Goal Formulation: With patient/family Time For Goal Achievement: 02/03/12 Potential to Achieve Goals: Good PT Goal: Sit to Stand - Progress: Progressing toward goal PT Goal: Stand to Sit - Progress: Progressing toward goal PT Goal: Ambulate - Progress: Progressing toward goal PT Goal: Up/Down Stairs - Progress: Met  Visit Information  Last PT Received On: 01/27/12 Assistance Needed: +1    Subjective Data  Subjective: "I am just ready to go home!" Patient Stated Goal: Decrease pain.  Go home.   Cognition  Overall Cognitive Status: Appears within functional limits for tasks assessed/performed Arousal/Alertness: Awake/alert Orientation Level: Appears intact for tasks assessed Behavior During Session: [facility] for tasks performed    Balance  Balance Balance Assessed: No  End of Session PT - End of Session Equipment Utilized During Treatment: Gait belt Activity Tolerance: Patient tolerated treatment well Patient left: in chair;with call bell/phone within reach;with family/visitor present Nurse Communication: Mobility status   GP     Cephus Shelling 01/27/2012, 2:09 PM  01/27/2012 Cephus Shelling, PT, DPT [phone]

## 2012-01-27 NOTE — Progress Notes (Signed)
UR COMPLETED  

## 2012-01-27 NOTE — Progress Notes (Signed)
I agree with the following treatment note after reviewing documentation.   Johnston, Myrna Vonseggern Brynn   OTR/L Pager: [phone] Office: [phone] .   

## 2012-01-27 NOTE — Progress Notes (Signed)
ANTICOAGULATION CONSULT - COUMADIN  Pharmacy Consult for Coumadin  HPI: Chase [name] is a 61 year old gentleman with history of CVA more than 10 years ago with DVT. A previous hypercoagulable workup at [facility] was unrevealing with recommendation made by hematology for indefinite anticoagulation. The patient has been on Coumadin for > 10 yrs. He was on a Lovenox bridge prior to surgery. Coumadin with Lovenox bridging to be restarted post-op.  Allergies: No Known Allergies  Height/Weight: Height: 5' 10.47" (179 cm) Weight: 216 lb 4.3 oz (98.1 kg) IBW/kg (Calculated) : 74.09   Vitals: Blood pressure 137/79, pulse 76, temperature 98.7 F (37.1 C), temperature source Oral, resp. rate 16, height 5' 10.47" (1.79 m), weight 216 lb 4.3 oz (98.1 kg), SpO2 99.00%.  Current active problems: Principal Problem:  *Right knee DJD Active Problems:  History of ischemic vertebrobasilar artery thalamic stroke  Patent foramen ovale  DVT (deep venous thrombosis)  Hypertension  Hearing deficit  Preoperative cardiovascular examination  Tortuous aorta  History of stroke   Medical / Surgical History: Past Medical History  Diagnosis Date  . History of ischemic vertebrobasilar artery thalamic stroke 2001  . Patent foramen ovale   . DVT (deep venous thrombosis)   . Hypertension   . Hearing deficit   . Right knee DJD   . Stroke 2001  . History of stroke   . Hyperlipidemia   . Clotting disorder    Past Surgical History  Procedure Date  . Knee arthroscopy     right knee  . Fracture surgery     R leg  . Appendectomy     Current Labs:  [facility] 01/27/12 0612 01/26/12 0602  HGB 11.8* --  HCT 33.3* --  PLT 128* --  LABPROT 13.5 12.5  INR 1.04 0.94  CREATININE 0.93 --   Lab Results  Component Value Date   INR 1.04 01/27/2012   INR 0.94 01/26/2012   INR 2.28* 01/19/2012   Estimated Creatinine Clearance: 98.8 ml/min (by C-G formula based on Cr of 0.93).  Pertinent Medication  History: Medication Sig  . amLODipine-benazepril (LOTREL) 5-20 MG per capsule Take 1 capsule by mouth daily.  Marland Kitchen enoxaparin (LOVENOX) 150 MG/ML injection Inject 150 mg into the skin.  . potassium gluconate 595 MG TABS Take 595 mg by mouth daily.  Marland Kitchen venlafaxine XR (EFFEXOR-XR) 150 MG 24 hr capsule Take 150 mg by mouth daily.  Marland Kitchen warfarin (COUMADIN) 5 MG tablet Take 10 mg by mouth daily.  Marland Kitchen amLODipine-benazepril (LOTREL) 10-20 MG per capsule Take 1 capsule by mouth daily.   Scheduled:    . amLODipine  5 mg Oral Daily  . benazepril  20 mg Oral Daily  . bisacodyl  10 mg Oral QAC supper  .  ceFAZolin (ANCEF) IV  2 g Intravenous Q6H  . docusate sodium  100 mg Oral BID  . enoxaparin (LOVENOX) injection  30 mg Subcutaneous Q12H  . enoxaparin (LOVENOX) injection  30 mg Subcutaneous NOW  . magnesium hydroxide  30 mL Oral Daily  . spiritus frumenti  1 each Oral BID AC  . venlafaxine XR  150 mg Oral Daily  . warfarin  12.5 mg Oral ONCE-1800    Assessment:  Today's INR reported as 1.04 following resumption of Coumadin post-op yesterday  Patient ordered to begin Lovenox bridging today.  Goals:  Target INR of 2-3.  Plan:  Will give Coumadin 15 mg today.  Change the time of the first dose of Lovenox post-op to this AM.  [End of surgery  01/26/12 @ 10:00]    Daily INR's, CBC. Warf ED initiated   Laurena Bering, Pharm. D. 01/27/2012, 10:27 AM

## 2012-01-28 DIAGNOSIS — I82409 Acute embolism and thrombosis of unspecified deep veins of unspecified lower extremity: Secondary | ICD-10-CM | POA: Diagnosis not present

## 2012-01-28 DIAGNOSIS — I10 Essential (primary) hypertension: Secondary | ICD-10-CM | POA: Diagnosis not present

## 2012-01-28 DIAGNOSIS — Z7901 Long term (current) use of anticoagulants: Secondary | ICD-10-CM | POA: Diagnosis not present

## 2012-01-28 DIAGNOSIS — Z5181 Encounter for therapeutic drug level monitoring: Secondary | ICD-10-CM | POA: Diagnosis not present

## 2012-01-28 DIAGNOSIS — Z8673 Personal history of transient ischemic attack (TIA), and cerebral infarction without residual deficits: Secondary | ICD-10-CM | POA: Diagnosis not present

## 2012-01-28 DIAGNOSIS — Z96659 Presence of unspecified artificial knee joint: Secondary | ICD-10-CM | POA: Diagnosis not present

## 2012-01-28 DIAGNOSIS — Z471 Aftercare following joint replacement surgery: Secondary | ICD-10-CM | POA: Diagnosis not present

## 2012-01-29 DIAGNOSIS — Z96659 Presence of unspecified artificial knee joint: Secondary | ICD-10-CM | POA: Diagnosis not present

## 2012-01-29 DIAGNOSIS — I10 Essential (primary) hypertension: Secondary | ICD-10-CM | POA: Diagnosis not present

## 2012-01-29 DIAGNOSIS — Z471 Aftercare following joint replacement surgery: Secondary | ICD-10-CM | POA: Diagnosis not present

## 2012-01-29 DIAGNOSIS — Z7901 Long term (current) use of anticoagulants: Secondary | ICD-10-CM | POA: Diagnosis not present

## 2012-01-29 DIAGNOSIS — Z5181 Encounter for therapeutic drug level monitoring: Secondary | ICD-10-CM | POA: Diagnosis not present

## 2012-01-29 DIAGNOSIS — I82409 Acute embolism and thrombosis of unspecified deep veins of unspecified lower extremity: Secondary | ICD-10-CM | POA: Diagnosis not present

## 2012-01-30 DIAGNOSIS — I10 Essential (primary) hypertension: Secondary | ICD-10-CM | POA: Diagnosis not present

## 2012-01-30 DIAGNOSIS — Z471 Aftercare following joint replacement surgery: Secondary | ICD-10-CM | POA: Diagnosis not present

## 2012-01-30 DIAGNOSIS — Z5181 Encounter for therapeutic drug level monitoring: Secondary | ICD-10-CM | POA: Diagnosis not present

## 2012-01-30 DIAGNOSIS — Z96659 Presence of unspecified artificial knee joint: Secondary | ICD-10-CM | POA: Diagnosis not present

## 2012-01-30 DIAGNOSIS — I82409 Acute embolism and thrombosis of unspecified deep veins of unspecified lower extremity: Secondary | ICD-10-CM | POA: Diagnosis not present

## 2012-01-30 DIAGNOSIS — Z7901 Long term (current) use of anticoagulants: Secondary | ICD-10-CM | POA: Diagnosis not present

## 2012-01-31 DIAGNOSIS — Z5181 Encounter for therapeutic drug level monitoring: Secondary | ICD-10-CM | POA: Diagnosis not present

## 2012-01-31 DIAGNOSIS — I10 Essential (primary) hypertension: Secondary | ICD-10-CM | POA: Diagnosis not present

## 2012-01-31 DIAGNOSIS — Z96659 Presence of unspecified artificial knee joint: Secondary | ICD-10-CM | POA: Diagnosis not present

## 2012-01-31 DIAGNOSIS — Z7901 Long term (current) use of anticoagulants: Secondary | ICD-10-CM | POA: Diagnosis not present

## 2012-01-31 DIAGNOSIS — Z471 Aftercare following joint replacement surgery: Secondary | ICD-10-CM | POA: Diagnosis not present

## 2012-01-31 DIAGNOSIS — I82409 Acute embolism and thrombosis of unspecified deep veins of unspecified lower extremity: Secondary | ICD-10-CM | POA: Diagnosis not present

## 2012-02-02 DIAGNOSIS — Z96659 Presence of unspecified artificial knee joint: Secondary | ICD-10-CM | POA: Diagnosis not present

## 2012-02-02 DIAGNOSIS — I82409 Acute embolism and thrombosis of unspecified deep veins of unspecified lower extremity: Secondary | ICD-10-CM | POA: Diagnosis not present

## 2012-02-02 DIAGNOSIS — I10 Essential (primary) hypertension: Secondary | ICD-10-CM | POA: Diagnosis not present

## 2012-02-02 DIAGNOSIS — Z471 Aftercare following joint replacement surgery: Secondary | ICD-10-CM | POA: Diagnosis not present

## 2012-02-02 DIAGNOSIS — Z7901 Long term (current) use of anticoagulants: Secondary | ICD-10-CM | POA: Diagnosis not present

## 2012-02-02 DIAGNOSIS — Z5181 Encounter for therapeutic drug level monitoring: Secondary | ICD-10-CM | POA: Diagnosis not present

## 2012-02-03 DIAGNOSIS — Z7901 Long term (current) use of anticoagulants: Secondary | ICD-10-CM | POA: Diagnosis not present

## 2012-02-03 DIAGNOSIS — Z96659 Presence of unspecified artificial knee joint: Secondary | ICD-10-CM | POA: Diagnosis not present

## 2012-02-03 DIAGNOSIS — I10 Essential (primary) hypertension: Secondary | ICD-10-CM | POA: Diagnosis not present

## 2012-02-03 DIAGNOSIS — Z5181 Encounter for therapeutic drug level monitoring: Secondary | ICD-10-CM | POA: Diagnosis not present

## 2012-02-03 DIAGNOSIS — I82409 Acute embolism and thrombosis of unspecified deep veins of unspecified lower extremity: Secondary | ICD-10-CM | POA: Diagnosis not present

## 2012-02-03 DIAGNOSIS — Z471 Aftercare following joint replacement surgery: Secondary | ICD-10-CM | POA: Diagnosis not present

## 2012-02-05 DIAGNOSIS — Z471 Aftercare following joint replacement surgery: Secondary | ICD-10-CM | POA: Diagnosis not present

## 2012-02-05 DIAGNOSIS — Z7901 Long term (current) use of anticoagulants: Secondary | ICD-10-CM | POA: Diagnosis not present

## 2012-02-05 DIAGNOSIS — I10 Essential (primary) hypertension: Secondary | ICD-10-CM | POA: Diagnosis not present

## 2012-02-05 DIAGNOSIS — Z5181 Encounter for therapeutic drug level monitoring: Secondary | ICD-10-CM | POA: Diagnosis not present

## 2012-02-05 DIAGNOSIS — I82409 Acute embolism and thrombosis of unspecified deep veins of unspecified lower extremity: Secondary | ICD-10-CM | POA: Diagnosis not present

## 2012-02-05 DIAGNOSIS — Z96659 Presence of unspecified artificial knee joint: Secondary | ICD-10-CM | POA: Diagnosis not present

## 2012-02-06 DIAGNOSIS — Z7901 Long term (current) use of anticoagulants: Secondary | ICD-10-CM | POA: Diagnosis not present

## 2012-02-06 DIAGNOSIS — Z471 Aftercare following joint replacement surgery: Secondary | ICD-10-CM | POA: Diagnosis not present

## 2012-02-06 DIAGNOSIS — I10 Essential (primary) hypertension: Secondary | ICD-10-CM | POA: Diagnosis not present

## 2012-02-06 DIAGNOSIS — Z96659 Presence of unspecified artificial knee joint: Secondary | ICD-10-CM | POA: Diagnosis not present

## 2012-02-06 DIAGNOSIS — I82409 Acute embolism and thrombosis of unspecified deep veins of unspecified lower extremity: Secondary | ICD-10-CM | POA: Diagnosis not present

## 2012-02-06 DIAGNOSIS — Z5181 Encounter for therapeutic drug level monitoring: Secondary | ICD-10-CM | POA: Diagnosis not present

## 2012-02-09 DIAGNOSIS — I10 Essential (primary) hypertension: Secondary | ICD-10-CM | POA: Diagnosis not present

## 2012-02-09 DIAGNOSIS — Z5181 Encounter for therapeutic drug level monitoring: Secondary | ICD-10-CM | POA: Diagnosis not present

## 2012-02-09 DIAGNOSIS — I82409 Acute embolism and thrombosis of unspecified deep veins of unspecified lower extremity: Secondary | ICD-10-CM | POA: Diagnosis not present

## 2012-02-09 DIAGNOSIS — Z471 Aftercare following joint replacement surgery: Secondary | ICD-10-CM | POA: Diagnosis not present

## 2012-02-09 DIAGNOSIS — Z7901 Long term (current) use of anticoagulants: Secondary | ICD-10-CM | POA: Diagnosis not present

## 2012-02-09 DIAGNOSIS — Z96659 Presence of unspecified artificial knee joint: Secondary | ICD-10-CM | POA: Diagnosis not present

## 2012-02-12 DIAGNOSIS — Z7901 Long term (current) use of anticoagulants: Secondary | ICD-10-CM | POA: Diagnosis not present

## 2012-02-12 DIAGNOSIS — I10 Essential (primary) hypertension: Secondary | ICD-10-CM | POA: Diagnosis not present

## 2012-02-12 DIAGNOSIS — Z471 Aftercare following joint replacement surgery: Secondary | ICD-10-CM | POA: Diagnosis not present

## 2012-02-12 DIAGNOSIS — Z5181 Encounter for therapeutic drug level monitoring: Secondary | ICD-10-CM | POA: Diagnosis not present

## 2012-02-12 DIAGNOSIS — Z96659 Presence of unspecified artificial knee joint: Secondary | ICD-10-CM | POA: Diagnosis not present

## 2012-02-12 DIAGNOSIS — I82409 Acute embolism and thrombosis of unspecified deep veins of unspecified lower extremity: Secondary | ICD-10-CM | POA: Diagnosis not present

## 2012-02-17 ENCOUNTER — Other Ambulatory Visit (INDEPENDENT_AMBULATORY_CARE_PROVIDER_SITE_OTHER): Payer: 59

## 2012-02-17 ENCOUNTER — Other Ambulatory Visit: Payer: Self-pay

## 2012-02-17 DIAGNOSIS — Q211 Atrial septal defect: Secondary | ICD-10-CM | POA: Diagnosis not present

## 2012-02-19 DIAGNOSIS — Z7901 Long term (current) use of anticoagulants: Secondary | ICD-10-CM | POA: Diagnosis not present

## 2012-02-19 DIAGNOSIS — F17200 Nicotine dependence, unspecified, uncomplicated: Secondary | ICD-10-CM | POA: Diagnosis not present

## 2012-02-19 DIAGNOSIS — J3489 Other specified disorders of nose and nasal sinuses: Secondary | ICD-10-CM | POA: Diagnosis not present

## 2012-02-19 DIAGNOSIS — I6990 Unspecified sequelae of unspecified cerebrovascular disease: Secondary | ICD-10-CM | POA: Diagnosis not present

## 2012-03-08 DIAGNOSIS — M79609 Pain in unspecified limb: Secondary | ICD-10-CM | POA: Diagnosis not present

## 2012-03-08 DIAGNOSIS — I6990 Unspecified sequelae of unspecified cerebrovascular disease: Secondary | ICD-10-CM | POA: Diagnosis not present

## 2012-03-08 DIAGNOSIS — I10 Essential (primary) hypertension: Secondary | ICD-10-CM | POA: Diagnosis not present

## 2012-03-08 DIAGNOSIS — F17200 Nicotine dependence, unspecified, uncomplicated: Secondary | ICD-10-CM | POA: Diagnosis not present

## 2012-03-15 DIAGNOSIS — I6990 Unspecified sequelae of unspecified cerebrovascular disease: Secondary | ICD-10-CM | POA: Diagnosis not present

## 2012-03-15 DIAGNOSIS — M79609 Pain in unspecified limb: Secondary | ICD-10-CM | POA: Diagnosis not present

## 2012-03-15 DIAGNOSIS — F17200 Nicotine dependence, unspecified, uncomplicated: Secondary | ICD-10-CM | POA: Diagnosis not present

## 2012-03-15 DIAGNOSIS — J019 Acute sinusitis, unspecified: Secondary | ICD-10-CM | POA: Diagnosis not present

## 2012-03-22 DIAGNOSIS — J019 Acute sinusitis, unspecified: Secondary | ICD-10-CM | POA: Diagnosis not present

## 2012-03-22 DIAGNOSIS — M62838 Other muscle spasm: Secondary | ICD-10-CM | POA: Diagnosis not present

## 2012-03-22 DIAGNOSIS — M79609 Pain in unspecified limb: Secondary | ICD-10-CM | POA: Diagnosis not present

## 2012-03-22 DIAGNOSIS — F17200 Nicotine dependence, unspecified, uncomplicated: Secondary | ICD-10-CM | POA: Diagnosis not present

## 2012-03-30 DIAGNOSIS — M79609 Pain in unspecified limb: Secondary | ICD-10-CM | POA: Diagnosis not present

## 2012-03-30 DIAGNOSIS — F17200 Nicotine dependence, unspecified, uncomplicated: Secondary | ICD-10-CM | POA: Diagnosis not present

## 2012-03-30 DIAGNOSIS — Z8673 Personal history of transient ischemic attack (TIA), and cerebral infarction without residual deficits: Secondary | ICD-10-CM | POA: Diagnosis not present

## 2012-03-30 DIAGNOSIS — M62838 Other muscle spasm: Secondary | ICD-10-CM | POA: Diagnosis not present

## 2012-04-05 ENCOUNTER — Other Ambulatory Visit: Payer: Self-pay

## 2012-04-05 DIAGNOSIS — I7789 Other specified disorders of arteries and arterioles: Secondary | ICD-10-CM

## 2012-04-07 ENCOUNTER — Other Ambulatory Visit: Payer: Self-pay

## 2012-04-07 DIAGNOSIS — I7789 Other specified disorders of arteries and arterioles: Secondary | ICD-10-CM

## 2012-04-12 ENCOUNTER — Ambulatory Visit (INDEPENDENT_AMBULATORY_CARE_PROVIDER_SITE_OTHER): Payer: 59

## 2012-04-12 DIAGNOSIS — I7789 Other specified disorders of arteries and arterioles: Secondary | ICD-10-CM

## 2012-04-13 ENCOUNTER — Ambulatory Visit: Payer: Self-pay | Admitting: Cardiovascular Disease

## 2012-04-13 DIAGNOSIS — F17200 Nicotine dependence, unspecified, uncomplicated: Secondary | ICD-10-CM | POA: Diagnosis not present

## 2012-04-13 DIAGNOSIS — Z8673 Personal history of transient ischemic attack (TIA), and cerebral infarction without residual deficits: Secondary | ICD-10-CM | POA: Diagnosis not present

## 2012-04-13 DIAGNOSIS — M62838 Other muscle spasm: Secondary | ICD-10-CM | POA: Diagnosis not present

## 2012-04-13 DIAGNOSIS — M79609 Pain in unspecified limb: Secondary | ICD-10-CM | POA: Diagnosis not present

## 2012-04-13 LAB — BASIC METABOLIC PANEL
BUN/Creatinine Ratio: 19 (ref 10–22)
CO2: 21 mmol/L (ref 19–28)
Chloride: 99 mmol/L (ref 97–108)
GFR calc Af Amer: 91 mL/min/{1.73_m2} (ref >59)
Potassium: 4.5 mmol/L (ref 3.5–5.2)
Sodium: 137 mmol/L (ref 134–144)

## 2012-04-14 ENCOUNTER — Other Ambulatory Visit: Payer: Self-pay | Admitting: *Deleted

## 2012-04-14 DIAGNOSIS — I7789 Other specified disorders of arteries and arterioles: Secondary | ICD-10-CM

## 2012-04-27 DIAGNOSIS — M79609 Pain in unspecified limb: Secondary | ICD-10-CM | POA: Diagnosis not present

## 2012-04-27 DIAGNOSIS — Z8673 Personal history of transient ischemic attack (TIA), and cerebral infarction without residual deficits: Secondary | ICD-10-CM | POA: Diagnosis not present

## 2012-04-27 DIAGNOSIS — M62838 Other muscle spasm: Secondary | ICD-10-CM | POA: Diagnosis not present

## 2012-04-27 DIAGNOSIS — F17200 Nicotine dependence, unspecified, uncomplicated: Secondary | ICD-10-CM | POA: Diagnosis not present

## 2012-04-29 DIAGNOSIS — Z96659 Presence of unspecified artificial knee joint: Secondary | ICD-10-CM | POA: Diagnosis not present

## 2012-04-29 DIAGNOSIS — M1710 Unilateral primary osteoarthritis, unspecified knee: Secondary | ICD-10-CM | POA: Diagnosis not present

## 2012-04-29 DIAGNOSIS — M25569 Pain in unspecified knee: Secondary | ICD-10-CM | POA: Diagnosis not present

## 2012-04-29 DIAGNOSIS — M2560 Stiffness of unspecified joint, not elsewhere classified: Secondary | ICD-10-CM | POA: Diagnosis not present

## 2012-05-03 DIAGNOSIS — Z96659 Presence of unspecified artificial knee joint: Secondary | ICD-10-CM | POA: Diagnosis not present

## 2012-05-03 DIAGNOSIS — M1710 Unilateral primary osteoarthritis, unspecified knee: Secondary | ICD-10-CM | POA: Diagnosis not present

## 2012-05-03 DIAGNOSIS — M2560 Stiffness of unspecified joint, not elsewhere classified: Secondary | ICD-10-CM | POA: Diagnosis not present

## 2012-05-03 DIAGNOSIS — M25569 Pain in unspecified knee: Secondary | ICD-10-CM | POA: Diagnosis not present

## 2012-05-04 DIAGNOSIS — M25569 Pain in unspecified knee: Secondary | ICD-10-CM | POA: Diagnosis not present

## 2012-05-04 DIAGNOSIS — M2560 Stiffness of unspecified joint, not elsewhere classified: Secondary | ICD-10-CM | POA: Diagnosis not present

## 2012-05-04 DIAGNOSIS — Z96659 Presence of unspecified artificial knee joint: Secondary | ICD-10-CM | POA: Diagnosis not present

## 2012-05-04 DIAGNOSIS — M1710 Unilateral primary osteoarthritis, unspecified knee: Secondary | ICD-10-CM | POA: Diagnosis not present

## 2012-05-06 DIAGNOSIS — Z96659 Presence of unspecified artificial knee joint: Secondary | ICD-10-CM | POA: Diagnosis not present

## 2012-05-06 DIAGNOSIS — M2560 Stiffness of unspecified joint, not elsewhere classified: Secondary | ICD-10-CM | POA: Diagnosis not present

## 2012-05-06 DIAGNOSIS — M1710 Unilateral primary osteoarthritis, unspecified knee: Secondary | ICD-10-CM | POA: Diagnosis not present

## 2012-05-12 ENCOUNTER — Telehealth: Payer: Self-pay | Admitting: *Deleted

## 2012-05-12 DIAGNOSIS — M25519 Pain in unspecified shoulder: Secondary | ICD-10-CM | POA: Diagnosis not present

## 2012-05-12 NOTE — Telephone Encounter (Signed)
Pt wife calling concerning pt imaging test taht he had done in December

## 2012-05-12 NOTE — Telephone Encounter (Signed)
Aortic arch in the distal section has mild dilatation, 4 cm. No way near needing intervention or surgery. \ Would just monitor this possibly by echocardiogram next year.

## 2012-05-12 NOTE — Telephone Encounter (Signed)
Can you please review and advise? CTA thanks

## 2012-05-13 ENCOUNTER — Other Ambulatory Visit: Payer: Self-pay

## 2012-05-13 DIAGNOSIS — Q211 Atrial septal defect: Secondary | ICD-10-CM

## 2012-05-13 NOTE — Telephone Encounter (Signed)
pts wife informed Understanding verb 

## 2012-05-18 DIAGNOSIS — M79609 Pain in unspecified limb: Secondary | ICD-10-CM | POA: Diagnosis not present

## 2012-05-18 DIAGNOSIS — M62838 Other muscle spasm: Secondary | ICD-10-CM | POA: Diagnosis not present

## 2012-05-18 DIAGNOSIS — Z8673 Personal history of transient ischemic attack (TIA), and cerebral infarction without residual deficits: Secondary | ICD-10-CM | POA: Diagnosis not present

## 2012-05-18 DIAGNOSIS — F17200 Nicotine dependence, unspecified, uncomplicated: Secondary | ICD-10-CM | POA: Diagnosis not present

## 2012-06-08 DIAGNOSIS — F17200 Nicotine dependence, unspecified, uncomplicated: Secondary | ICD-10-CM | POA: Diagnosis not present

## 2012-06-08 DIAGNOSIS — M79609 Pain in unspecified limb: Secondary | ICD-10-CM | POA: Diagnosis not present

## 2012-06-08 DIAGNOSIS — M62838 Other muscle spasm: Secondary | ICD-10-CM | POA: Diagnosis not present

## 2012-06-08 DIAGNOSIS — Z8673 Personal history of transient ischemic attack (TIA), and cerebral infarction without residual deficits: Secondary | ICD-10-CM | POA: Diagnosis not present

## 2012-06-22 DIAGNOSIS — M79609 Pain in unspecified limb: Secondary | ICD-10-CM | POA: Diagnosis not present

## 2012-06-22 DIAGNOSIS — Z8673 Personal history of transient ischemic attack (TIA), and cerebral infarction without residual deficits: Secondary | ICD-10-CM | POA: Diagnosis not present

## 2012-06-22 DIAGNOSIS — F17200 Nicotine dependence, unspecified, uncomplicated: Secondary | ICD-10-CM | POA: Diagnosis not present

## 2012-06-22 DIAGNOSIS — M62838 Other muscle spasm: Secondary | ICD-10-CM | POA: Diagnosis not present

## 2012-06-29 ENCOUNTER — Inpatient Hospital Stay (HOSPITAL_COMMUNITY): Payer: 59

## 2012-06-29 ENCOUNTER — Other Ambulatory Visit: Payer: Self-pay | Admitting: Physician Assistant

## 2012-06-29 ENCOUNTER — Inpatient Hospital Stay (HOSPITAL_COMMUNITY)
Admission: AD | Admit: 2012-06-29 | Discharge: 2012-07-01 | DRG: 908 | Disposition: A | Discharge status: Home / Self Care | Payer: 59 | Source: Ambulatory Visit | Attending: Orthopedic Surgery | Admitting: Orthopedic Surgery

## 2012-06-29 DIAGNOSIS — E785 Hyperlipidemia, unspecified: Secondary | ICD-10-CM | POA: Diagnosis present

## 2012-06-29 DIAGNOSIS — S81001A Unspecified open wound, right knee, initial encounter: Secondary | ICD-10-CM

## 2012-06-29 DIAGNOSIS — W1789XA Other fall from one level to another, initial encounter: Secondary | ICD-10-CM | POA: Diagnosis present

## 2012-06-29 DIAGNOSIS — I712 Thoracic aortic aneurysm, without rupture, unspecified: Secondary | ICD-10-CM | POA: Diagnosis present

## 2012-06-29 DIAGNOSIS — Z7901 Long term (current) use of anticoagulants: Secondary | ICD-10-CM | POA: Diagnosis not present

## 2012-06-29 DIAGNOSIS — W19XXXA Unspecified fall, initial encounter: Secondary | ICD-10-CM | POA: Diagnosis not present

## 2012-06-29 DIAGNOSIS — Z96659 Presence of unspecified artificial knee joint: Secondary | ICD-10-CM | POA: Diagnosis not present

## 2012-06-29 DIAGNOSIS — M1711 Unilateral primary osteoarthritis, right knee: Secondary | ICD-10-CM | POA: Diagnosis present

## 2012-06-29 DIAGNOSIS — S91009A Unspecified open wound, unspecified ankle, initial encounter: Secondary | ICD-10-CM | POA: Diagnosis not present

## 2012-06-29 DIAGNOSIS — Z86718 Personal history of other venous thrombosis and embolism: Secondary | ICD-10-CM | POA: Diagnosis not present

## 2012-06-29 DIAGNOSIS — S81009A Unspecified open wound, unspecified knee, initial encounter: Secondary | ICD-10-CM | POA: Diagnosis not present

## 2012-06-29 DIAGNOSIS — Q2112 Patent foramen ovale: Secondary | ICD-10-CM

## 2012-06-29 DIAGNOSIS — Z96651 Presence of right artificial knee joint: Secondary | ICD-10-CM

## 2012-06-29 DIAGNOSIS — H9190 Unspecified hearing loss, unspecified ear: Secondary | ICD-10-CM | POA: Diagnosis present

## 2012-06-29 DIAGNOSIS — Z79899 Other long term (current) drug therapy: Secondary | ICD-10-CM | POA: Diagnosis not present

## 2012-06-29 DIAGNOSIS — Q211 Atrial septal defect: Secondary | ICD-10-CM

## 2012-06-29 DIAGNOSIS — Q2111 Secundum atrial septal defect: Secondary | ICD-10-CM

## 2012-06-29 DIAGNOSIS — S81809A Unspecified open wound, unspecified lower leg, initial encounter: Secondary | ICD-10-CM | POA: Diagnosis not present

## 2012-06-29 DIAGNOSIS — F17200 Nicotine dependence, unspecified, uncomplicated: Secondary | ICD-10-CM | POA: Diagnosis present

## 2012-06-29 DIAGNOSIS — M7040 Prepatellar bursitis, unspecified knee: Secondary | ICD-10-CM | POA: Diagnosis present

## 2012-06-29 DIAGNOSIS — Z8673 Personal history of transient ischemic attack (TIA), and cerebral infarction without residual deficits: Secondary | ICD-10-CM

## 2012-06-29 DIAGNOSIS — M76899 Other specified enthesopathies of unspecified lower limb, excluding foot: Secondary | ICD-10-CM | POA: Diagnosis not present

## 2012-06-29 DIAGNOSIS — Z01818 Encounter for other preprocedural examination: Secondary | ICD-10-CM | POA: Diagnosis not present

## 2012-06-29 DIAGNOSIS — I10 Essential (primary) hypertension: Secondary | ICD-10-CM | POA: Diagnosis not present

## 2012-06-29 DIAGNOSIS — I771 Stricture of artery: Secondary | ICD-10-CM | POA: Diagnosis present

## 2012-06-29 LAB — CBC
MCH: 32.1 pg (ref 26.0–34.0)
MCHC: 36.5 g/dL — ABNORMAL HIGH (ref 30.0–36.0)
MCV: 87.9 fL (ref 78.0–100.0)
Platelets: 172 10*3/uL (ref 150–400)
RBC: 4.48 MIL/uL (ref 4.22–5.81)
RDW: 14.8 % (ref 11.5–15.5)

## 2012-06-29 LAB — COMPREHENSIVE METABOLIC PANEL
AST: 19 U/L (ref 0–37)
CO2: 22 mEq/L (ref 19–32)
Calcium: 9.1 mg/dL (ref 8.4–10.5)
Creatinine, Ser: 0.95 mg/dL (ref 0.50–1.35)
GFR calc non Af Amer: 88 mL/min — ABNORMAL LOW (ref >90)
Total Protein: 6.5 g/dL (ref 6.0–8.3)

## 2012-06-29 LAB — PROTIME-INR: INR: 1.76 — ABNORMAL HIGH (ref 0.00–1.49)

## 2012-06-29 MED ORDER — BENAZEPRIL HCL 20 MG PO TABS
20.0000 mg | ORAL_TABLET | Freq: Every day | ORAL | Status: DC
  Administered 2012-06-30: 20 mg via ORAL
  Filled 2012-06-29 (×2): qty 1

## 2012-06-29 MED ORDER — ASPIRIN EC 325 MG PO TBEC
325.0000 mg | DELAYED_RELEASE_TABLET | Freq: Every day | ORAL | Status: DC
  Administered 2012-06-29: 325 mg via ORAL
  Filled 2012-06-29 (×2): qty 1

## 2012-06-29 MED ORDER — LORAZEPAM 1 MG PO TABS: 1.0000 mg | ORAL_TABLET | Freq: Three times a day (TID) | ORAL | Status: DC | PRN

## 2012-06-29 MED ORDER — AMLODIPINE BESY-BENAZEPRIL HCL 10-20 MG PO CAPS: 1.0000 | ORAL_CAPSULE | Freq: Every day | ORAL | Status: DC

## 2012-06-29 MED ORDER — DOCUSATE SODIUM 100 MG PO CAPS
100.0000 mg | ORAL_CAPSULE | Freq: Two times a day (BID) | ORAL | Status: DC
  Administered 2012-06-30: 100 mg via ORAL
  Filled 2012-06-29: qty 1

## 2012-06-29 MED ORDER — VENLAFAXINE HCL ER 150 MG PO CP24
150.0000 mg | ORAL_CAPSULE | Freq: Every day | ORAL | Status: DC
  Administered 2012-06-30: 150 mg via ORAL
  Filled 2012-06-29 (×2): qty 1

## 2012-06-29 MED ORDER — OXYCODONE HCL 5 MG PO TABS: 5.0000 mg | ORAL_TABLET | ORAL | Status: DC | PRN

## 2012-06-29 MED ORDER — VANCOMYCIN HCL 10 G IV SOLR
1500.0000 mg | INTRAVENOUS | Status: DC
  Filled 2012-06-29: qty 1500

## 2012-06-29 MED ORDER — POTASSIUM CHLORIDE IN NACL 20-0.9 MEQ/L-% IV SOLN
INTRAVENOUS | Status: DC
  Administered 2012-06-30: via INTRAVENOUS
  Filled 2012-06-29 (×3): qty 1000

## 2012-06-29 MED ORDER — VANCOMYCIN HCL 10 G IV SOLR
1500.0000 mg | Freq: Once | INTRAVENOUS | Status: AC
  Administered 2012-06-30: 1500 mg via INTRAVENOUS
  Filled 2012-06-29: qty 1500

## 2012-06-29 MED ORDER — AMLODIPINE BESYLATE 10 MG PO TABS
10.0000 mg | ORAL_TABLET | Freq: Every day | ORAL | Status: DC
  Administered 2012-06-30: 10 mg via ORAL
  Filled 2012-06-29 (×2): qty 1

## 2012-06-29 MED ORDER — HYDROMORPHONE HCL PF 1 MG/ML IJ SOLN: 0.5000 mg | INTRAMUSCULAR | Status: DC | PRN

## 2012-06-29 NOTE — H&P (Signed)
Chase [name] is an 62 y.o. male.   Chief Complaint: right knee laceration over total knee HPI: 61 yowm 6 months s/p right total knee fell of the second rung of a ladder today.  Open wound 3inches by 1 inch deep.  Xrays neg.  To OR for I and D in am.  Admitted tonight for IV vancomycin  Past Medical History  Diagnosis Date  . History of ischemic vertebrobasilar artery thalamic stroke 2001  . Patent foramen ovale   . DVT (deep venous thrombosis)   . Hypertension   . Hearing deficit   . Right knee DJD   . Stroke 2001  . History of stroke   . Hyperlipidemia   . Clotting disorder     Past Surgical History  Procedure Laterality Date  . Knee arthroscopy      right knee  . Fracture surgery      R leg  . Appendectomy    . Total knee arthroplasty  01/26/2012    Procedure: TOTAL KNEE ARTHROPLASTY;  Surgeon: Nilda Simmer, MD;  Location: [facility] OR;  Service: Orthopedics;  Laterality: Right;    Family History  Problem Relation Age of Onset  . Cancer Father   . Seizures Brother    Social History:  reports that he has been smoking Cigarettes.  He has a 98 pack-year smoking history. He does not have any smokeless tobacco history on file. He reports that he drinks about 25.2 ounces of alcohol per week. He reports that he does not use illicit drugs.  Allergies: No Known Allergies  Current Outpatient Prescriptions on File Prior to Visit  Medication Sig Dispense Refill  . amLODipine-benazepril (LOTREL) 10-20 MG per capsule Take 1 capsule by mouth daily.  30 capsule  6  . potassium gluconate 595 MG TABS Take 595 mg by mouth daily.      Marland Kitchen venlafaxine XR (EFFEXOR-XR) 150 MG 24 hr capsule Take 150 mg by mouth daily.      Marland Kitchen warfarin (COUMADIN) 5 MG tablet Take 10 mg by mouth daily.      Marland Kitchen docusate sodium 100 MG CAPS Take 100 mg by mouth 2 (two) times daily.  60 capsule  0  . enoxaparin (LOVENOX) 30 MG/0.3ML injection Inject 0.3 mLs (30 mg total) into the skin every 12 (twelve) hours.  24 Syringe   0  . LORazepam (ATIVAN) 1 MG tablet Take 1 tablet (1 mg total) by mouth every 8 (eight) hours as needed for anxiety.  30 tablet  0  . oxyCODONE (OXY IR/ROXICODONE) 5 MG immediate release tablet Take 3 tablets (15 mg total) by mouth every 3 (three) hours as needed for pain.  100 tablet  0  . oxyCODONE (OXYCONTIN) 20 MG 12 hr tablet Take 1 tablet (20 mg total) by mouth every 12 (twelve) hours.  20 tablet  0   No current facility-administered medications on file prior to visit.    (Not in a hospital admission)  No results found for this or any previous visit (from the past 48 hour(s)). No results found.  Review of Systems  Constitutional: Negative.   HENT: Positive for hearing loss.   Eyes: Negative.   Respiratory: Negative.   Cardiovascular: Positive for leg swelling. Negative for chest pain, palpitations, orthopnea, claudication and PND.  Gastrointestinal: Negative.   Genitourinary: Negative.   Musculoskeletal: Positive for joint pain.       Right knee open wound  Skin: Negative.   Neurological:  No short term memory due to stroke.  Address all questions to his wife  Endo/Heme/Allergies: Negative for environmental allergies and polydipsia. Bruises/bleeds easily.  Psychiatric/Behavioral: Negative.     Blood pressure 132/87, pulse 83, temperature 98.1 F (36.7 C), height 5\' 11"  (1.803 m), weight 97.523 kg (215 lb). Physical Exam  Constitutional: He is oriented to person, place, and time. He appears well-developed and well-nourished.  HENT:  Head: Normocephalic and atraumatic.  Eyes: Conjunctivae and EOM are normal. Pupils are equal, round, and reactive to light.  Neck: Neck supple.  Musculoskeletal:  Right knee open wound 1/2 to 1 inch deep.  bleeding  Neurological: He is alert and oriented to person, place, and time.  Skin: Skin is warm and dry.     Assessment Patient Active Problem List  Diagnosis  . History of ischemic vertebrobasilar artery thalamic stroke  .  Patent foramen ovale  . DVT (deep venous thrombosis)  . Hypertension  . Hearing deficit  . Right knee DJD  . Preoperative cardiovascular examination  . Tortuous aorta  . History of stroke  . Open wound of knee,   . S/P right total knee replacement      Plan Admit tonight for IV antibiotics.  To OR in AM for I and D.  Pascal Lux 06/29/2012, 7:37 PM

## 2012-06-30 ENCOUNTER — Encounter (HOSPITAL_COMMUNITY): Payer: Self-pay

## 2012-06-30 ENCOUNTER — Encounter (HOSPITAL_COMMUNITY)
Admission: AD | Disposition: A | Discharge status: Home / Self Care | Payer: Self-pay | Source: Ambulatory Visit | Attending: Orthopedic Surgery

## 2012-06-30 ENCOUNTER — Inpatient Hospital Stay (HOSPITAL_COMMUNITY): Payer: 59 | Admitting: Anesthesiology

## 2012-06-30 ENCOUNTER — Encounter (HOSPITAL_COMMUNITY): Payer: Self-pay | Admitting: Anesthesiology

## 2012-06-30 DIAGNOSIS — W19XXXA Unspecified fall, initial encounter: Secondary | ICD-10-CM | POA: Diagnosis not present

## 2012-06-30 DIAGNOSIS — S81009A Unspecified open wound, unspecified knee, initial encounter: Secondary | ICD-10-CM | POA: Diagnosis not present

## 2012-06-30 DIAGNOSIS — M76899 Other specified enthesopathies of unspecified lower limb, excluding foot: Secondary | ICD-10-CM | POA: Diagnosis not present

## 2012-06-30 DIAGNOSIS — M7040 Prepatellar bursitis, unspecified knee: Secondary | ICD-10-CM | POA: Diagnosis not present

## 2012-06-30 DIAGNOSIS — I10 Essential (primary) hypertension: Secondary | ICD-10-CM | POA: Diagnosis not present

## 2012-06-30 HISTORY — PX: KNEE BURSECTOMY: SHX5882

## 2012-06-30 HISTORY — PX: INCISION AND DRAINAGE: SHX5863

## 2012-06-30 LAB — SURGICAL PCR SCREEN
MRSA, PCR: NEGATIVE
Staphylococcus aureus: NEGATIVE

## 2012-06-30 SURGERY — INCISION AND DRAINAGE
Anesthesia: General | Site: Knee | Laterality: Right | Wound class: Contaminated

## 2012-06-30 SURGERY — IRRIGATION AND DEBRIDEMENT KNEE
Anesthesia: General | Laterality: Right

## 2012-06-30 MED ORDER — ALUM & MAG HYDROXIDE-SIMETH 200-200-20 MG/5ML PO SUSP: 30.0000 mL | ORAL | Status: DC | PRN

## 2012-06-30 MED ORDER — HYDROCODONE-ACETAMINOPHEN 5-325 MG PO TABS
1.0000 | ORAL_TABLET | ORAL | Status: DC | PRN
  Administered 2012-06-30 – 2012-07-01 (×4): 2 via ORAL
  Filled 2012-06-30 (×4): qty 2

## 2012-06-30 MED ORDER — POTASSIUM CHLORIDE IN NACL 20-0.9 MEQ/L-% IV SOLN
INTRAVENOUS | Status: DC
  Administered 2012-06-30: 16:00:00 via INTRAVENOUS
  Filled 2012-06-30 (×4): qty 1000

## 2012-06-30 MED ORDER — VANCOMYCIN HCL 10 G IV SOLR
1500.0000 mg | Freq: Three times a day (TID) | INTRAVENOUS | Status: AC
  Administered 2012-06-30: 1500 mg via INTRAVENOUS
  Filled 2012-06-30: qty 1500

## 2012-06-30 MED ORDER — PHENOL 1.4 % MT LIQD: 1.0000 | OROMUCOSAL | Status: DC | PRN

## 2012-06-30 MED ORDER — EPHEDRINE SULFATE 50 MG/ML IJ SOLN
INTRAMUSCULAR | Status: DC | PRN
  Administered 2012-06-30: 20 mg via INTRAVENOUS
  Administered 2012-06-30: 10 mg via INTRAVENOUS

## 2012-06-30 MED ORDER — WARFARIN SODIUM 2.5 MG PO TABS
12.5000 mg | ORAL_TABLET | Freq: Once | ORAL | Status: AC
  Administered 2012-06-30: 12.5 mg via ORAL
  Filled 2012-06-30: qty 1

## 2012-06-30 MED ORDER — DOCUSATE SODIUM 100 MG PO CAPS: 100.0000 mg | ORAL_CAPSULE | Freq: Two times a day (BID) | ORAL | Status: DC

## 2012-06-30 MED ORDER — VANCOMYCIN HCL 1000 MG IV SOLR
1500.0000 mg | INTRAVENOUS | Status: DC | PRN
  Administered 2012-06-30: 1500 mg via INTRAVENOUS

## 2012-06-30 MED ORDER — ONDANSETRON HCL 4 MG/2ML IJ SOLN: 4.0000 mg | Freq: Four times a day (QID) | INTRAMUSCULAR | Status: DC | PRN

## 2012-06-30 MED ORDER — MENTHOL 3 MG MT LOZG: 1.0000 | LOZENGE | OROMUCOSAL | Status: DC | PRN

## 2012-06-30 MED ORDER — METOCLOPRAMIDE HCL 5 MG/ML IJ SOLN
5.0000 mg | Freq: Three times a day (TID) | INTRAMUSCULAR | Status: DC | PRN
  Filled 2012-06-30: qty 2

## 2012-06-30 MED ORDER — CELECOXIB 200 MG PO CAPS
200.0000 mg | ORAL_CAPSULE | Freq: Two times a day (BID) | ORAL | Status: DC
  Administered 2012-06-30 (×2): 200 mg via ORAL
  Filled 2012-06-30 (×4): qty 1

## 2012-06-30 MED ORDER — PROPOFOL 10 MG/ML IV BOLUS
INTRAVENOUS | Status: DC | PRN
  Administered 2012-06-30: 200 mg via INTRAVENOUS

## 2012-06-30 MED ORDER — WARFARIN - PHARMACIST DOSING INPATIENT: Freq: Every day | Status: DC

## 2012-06-30 MED ORDER — VANCOMYCIN HCL 10 G IV SOLR
1500.0000 mg | INTRAVENOUS | Status: DC
  Filled 2012-06-30: qty 1500

## 2012-06-30 MED ORDER — SODIUM CHLORIDE 0.9 % IR SOLN
Status: DC | PRN
  Administered 2012-06-30: 3000 mL

## 2012-06-30 MED ORDER — HYDROMORPHONE HCL PF 1 MG/ML IJ SOLN: 0.5000 mg | INTRAMUSCULAR | Status: DC | PRN

## 2012-06-30 MED ORDER — FENTANYL CITRATE 0.05 MG/ML IJ SOLN
INTRAMUSCULAR | Status: DC | PRN
  Administered 2012-06-30: 100 ug via INTRAVENOUS
  Administered 2012-06-30: 50 ug via INTRAVENOUS

## 2012-06-30 MED ORDER — DEXAMETHASONE SODIUM PHOSPHATE 4 MG/ML IJ SOLN
INTRAMUSCULAR | Status: DC | PRN
  Administered 2012-06-30: 8 mg via INTRAVENOUS

## 2012-06-30 MED ORDER — ZOLPIDEM TARTRATE 5 MG PO TABS: 5.0000 mg | ORAL_TABLET | Freq: Every evening | ORAL | Status: DC | PRN

## 2012-06-30 MED ORDER — DIPHENHYDRAMINE HCL 12.5 MG/5ML PO ELIX: 12.5000 mg | ORAL_SOLUTION | ORAL | Status: DC | PRN

## 2012-06-30 MED ORDER — ONDANSETRON HCL 4 MG/2ML IJ SOLN
INTRAMUSCULAR | Status: DC | PRN
  Administered 2012-06-30: 4 mg via INTRAVENOUS

## 2012-06-30 MED ORDER — HYDROMORPHONE HCL PF 1 MG/ML IJ SOLN
INTRAMUSCULAR | Status: AC
  Filled 2012-06-30: qty 1

## 2012-06-30 MED ORDER — PROMETHAZINE HCL 25 MG/ML IJ SOLN
6.2500 mg | INTRAMUSCULAR | Status: DC | PRN
  Filled 2012-06-30: qty 1

## 2012-06-30 MED ORDER — ACETAMINOPHEN 650 MG RE SUPP: 650.0000 mg | Freq: Four times a day (QID) | RECTAL | Status: DC | PRN

## 2012-06-30 MED ORDER — MIDAZOLAM HCL 5 MG/5ML IJ SOLN
INTRAMUSCULAR | Status: DC | PRN
  Administered 2012-06-30: 2 mg via INTRAVENOUS

## 2012-06-30 MED ORDER — BISACODYL 5 MG PO TBEC
5.0000 mg | DELAYED_RELEASE_TABLET | Freq: Every day | ORAL | Status: DC
  Administered 2012-06-30: 5 mg via ORAL
  Filled 2012-06-30: qty 1

## 2012-06-30 MED ORDER — LACTATED RINGERS IV SOLN
INTRAVENOUS | Status: DC | PRN
  Administered 2012-06-30: 08:00:00 via INTRAVENOUS

## 2012-06-30 MED ORDER — METOCLOPRAMIDE HCL 5 MG PO TABS
5.0000 mg | ORAL_TABLET | Freq: Three times a day (TID) | ORAL | Status: DC | PRN
  Filled 2012-06-30: qty 2

## 2012-06-30 MED ORDER — PHENYLEPHRINE HCL 10 MG/ML IJ SOLN
INTRAMUSCULAR | Status: DC | PRN
  Administered 2012-06-30: 80 ug via INTRAVENOUS

## 2012-06-30 MED ORDER — ACETAMINOPHEN 325 MG PO TABS: 650.0000 mg | ORAL_TABLET | Freq: Four times a day (QID) | ORAL | Status: DC | PRN

## 2012-06-30 MED ORDER — OXYCODONE HCL 5 MG PO TABS: 5.0000 mg | ORAL_TABLET | Freq: Once | ORAL | Status: AC | PRN

## 2012-06-30 MED ORDER — ONDANSETRON HCL 4 MG PO TABS: 4.0000 mg | ORAL_TABLET | Freq: Four times a day (QID) | ORAL | Status: DC | PRN

## 2012-06-30 MED ORDER — OXYCODONE HCL 5 MG/5ML PO SOLN: 5.0000 mg | Freq: Once | ORAL | Status: AC | PRN

## 2012-06-30 MED ORDER — WARFARIN SODIUM 10 MG PO TABS: 10.0000 mg | ORAL_TABLET | Freq: Every day | ORAL | Status: DC

## 2012-06-30 MED ORDER — HYDROMORPHONE HCL PF 1 MG/ML IJ SOLN
0.2500 mg | INTRAMUSCULAR | Status: DC | PRN
  Administered 2012-06-30 (×2): 0.5 mg via INTRAVENOUS

## 2012-06-30 SURGICAL SUPPLY — 57 items
BANDAGE ELASTIC 6 VELCRO ST LF (GAUZE/BANDAGES/DRESSINGS) ×6 IMPLANT
BANDAGE ESMARK 6X9 LF (GAUZE/BANDAGES/DRESSINGS) ×2 IMPLANT
BANDAGE GAUZE ELAST BULKY 4 IN (GAUZE/BANDAGES/DRESSINGS) ×3 IMPLANT
BLADE SURG 10 STRL SS (BLADE) ×3 IMPLANT
BLADE SURG 15 STRL LF DISP TIS (BLADE) ×2 IMPLANT
BLADE SURG 15 STRL SS (BLADE) ×1
BNDG COHESIVE 4X5 TAN STRL (GAUZE/BANDAGES/DRESSINGS) ×3 IMPLANT
BNDG ESMARK 6X9 LF (GAUZE/BANDAGES/DRESSINGS) ×3
CLEANER TIP ELECTROSURG 2X2 (MISCELLANEOUS) ×3 IMPLANT
CLOTH BEACON ORANGE TIMEOUT ST (SAFETY) ×3 IMPLANT
COVER MAYO STAND STRL (DRAPES) ×3 IMPLANT
CUFF TOURNIQUET SINGLE 34IN LL (TOURNIQUET CUFF) ×3 IMPLANT
DRAPE INCISE IOBAN 66X45 STRL (DRAPES) ×3 IMPLANT
DRAPE U-SHAPE 47X51 STRL (DRAPES) ×6 IMPLANT
DRSG MEPITEL 4X7.2 (GAUZE/BANDAGES/DRESSINGS) ×3 IMPLANT
DRSG PAD ABDOMINAL 8X10 ST (GAUZE/BANDAGES/DRESSINGS) ×6 IMPLANT
ELECT CAUTERY BLADE 6.4 (BLADE) ×3 IMPLANT
ELECT NEEDLE TIP 2.8 STRL (NEEDLE) ×3 IMPLANT
ELECT REM PT RETURN 9FT ADLT (ELECTROSURGICAL) ×3
ELECTRODE REM PT RTRN 9FT ADLT (ELECTROSURGICAL) ×2 IMPLANT
GLOVE BIO SURGEON STRL SZ7 (GLOVE) ×3 IMPLANT
GLOVE BIOGEL PI IND STRL 7.0 (GLOVE) ×2 IMPLANT
GLOVE BIOGEL PI IND STRL 7.5 (GLOVE) ×2 IMPLANT
GLOVE BIOGEL PI INDICATOR 7.0 (GLOVE) ×1
GLOVE BIOGEL PI INDICATOR 7.5 (GLOVE) ×1
GLOVE SS BIOGEL STRL SZ 7.5 (GLOVE) ×2 IMPLANT
GLOVE SUPERSENSE BIOGEL SZ 7.5 (GLOVE) ×1
GOWN PREVENTION PLUS XLARGE (GOWN DISPOSABLE) ×3 IMPLANT
GOWN STRL NON-REIN LRG LVL3 (GOWN DISPOSABLE) ×6 IMPLANT
HANDPIECE INTERPULSE COAX TIP (DISPOSABLE) ×1
KIT BASIN OR (CUSTOM PROCEDURE TRAY) ×3 IMPLANT
KIT ROOM TURNOVER OR (KITS) ×6 IMPLANT
MANIFOLD NEPTUNE II (INSTRUMENTS) ×3 IMPLANT
NS IRRIG 1000ML POUR BTL (IV SOLUTION) ×3 IMPLANT
PACK ORTHO EXTREMITY (CUSTOM PROCEDURE TRAY) ×3 IMPLANT
PAD ARMBOARD 7.5X6 YLW CONV (MISCELLANEOUS) ×6 IMPLANT
PADDING CAST COTTON 6X4 STRL (CAST SUPPLIES) ×3 IMPLANT
PASSER SUT SWANSON 36MM LOOP (INSTRUMENTS) ×3 IMPLANT
SET HNDPC FAN SPRY TIP SCT (DISPOSABLE) ×2 IMPLANT
SPONGE GAUZE 4X4 12PLY (GAUZE/BANDAGES/DRESSINGS) ×3 IMPLANT
SPONGE LAP 18X18 X RAY DECT (DISPOSABLE) ×3 IMPLANT
STAPLER VISISTAT 35W (STAPLE) ×3 IMPLANT
STOCKINETTE IMPERVIOUS LG (DRAPES) ×6 IMPLANT
SUCTION FRAZIER TIP 10 FR DISP (SUCTIONS) ×3 IMPLANT
SUT ETHIBOND 2 OS 4 DA (SUTURE) ×3 IMPLANT
SUT ETHILON 2 0 FS 18 (SUTURE) ×6 IMPLANT
SUT PROLENE 4 0 PS 2 18 (SUTURE) ×3 IMPLANT
SUT VIC AB 0 CT1 27 (SUTURE) ×2
SUT VIC AB 0 CT1 27XBRD ANBCTR (SUTURE) ×4 IMPLANT
SUT VIC AB 1 CT1 27 (SUTURE) ×2
SUT VIC AB 1 CT1 27XBRD ANBCTR (SUTURE) ×4 IMPLANT
SUT VIC AB 2-0 CT1 27 (SUTURE) ×2
SUT VIC AB 2-0 CT1 TAPERPNT 27 (SUTURE) ×4 IMPLANT
TOWEL OR 17X24 6PK STRL BLUE (TOWEL DISPOSABLE) ×3 IMPLANT
TOWEL OR 17X26 10 PK STRL BLUE (TOWEL DISPOSABLE) ×3 IMPLANT
TUBE CONNECTING 12X1/4 (SUCTIONS) ×3 IMPLANT
YANKAUER SUCT BULB TIP NO VENT (SUCTIONS) ×3 IMPLANT

## 2012-06-30 NOTE — Anesthesia Preprocedure Evaluation (Addendum)
Anesthesia Evaluation    Reviewed: Allergy & Precautions, H&P , NPO status , Patient's Chart, lab work & pertinent test results  Airway Mallampati: III TM Distance: >3 FB Neck ROM: Full    Dental  (+) Poor Dentition and Dental Advisory Given   Pulmonary neg pulmonary ROS, Current Smoker,    Pulmonary exam normal       Cardiovascular hypertension, + Peripheral Vascular Disease     Neuro/Psych CVA, Residual Symptoms negative psych ROS   GI/Hepatic negative GI ROS, Neg liver ROS,   Endo/Other  negative endocrine ROS  Renal/GU negative Renal ROS     Musculoskeletal   Abdominal   Peds  Hematology   Anesthesia Other Findings   Reproductive/Obstetrics                          Anesthesia Physical Anesthesia Plan  ASA: III  Anesthesia Plan: General   Post-op Pain Management:    Induction:   Airway Management Planned: LMA  Additional Equipment:   Intra-op Plan:   Post-operative Plan: Extubation in OR  Informed Consent: I have reviewed the patients History and Physical, chart, labs and discussed the procedure including the risks, benefits and alternatives for the proposed anesthesia with the patient or authorized representative who has indicated his/her understanding and acceptance.   Dental advisory given  Plan Discussed with: CRNA, Anesthesiologist and Surgeon  Anesthesia Plan Comments:        Anesthesia Quick Evaluation

## 2012-06-30 NOTE — Transfer of Care (Signed)
Immediate Anesthesia Transfer of Care Note  [name]  Procedure(s) Performed: Procedure(s) with comments: INCISION AND DRAINAGE (Right) KNEE BURSECTOMY (Right) - Pre-patellar with wound closure  Patient Location: PACU  Anesthesia Type:General  Level of Consciousness: awake, alert  and oriented  Airway & Oxygen Therapy: Patient Spontanous Breathing and Patient connected to nasal cannula oxygen  Post-op Assessment: Report given to PACU RN and Post -op Vital signs reviewed and stable  Post vital signs: Reviewed and stable  Complications: No apparent anesthesia complications

## 2012-06-30 NOTE — Interval H&P Note (Signed)
History and Physical Interval Note:  06/30/2012 8:20 AM  Chase [name]  has presented today for surgery, with the diagnosis of Infected Right Knee  The various methods of treatment have been discussed with the patient and family. After consideration of risks, benefits and other options for treatment, the patient has consented to  Procedure(s): INCISION AND DRAINAGE WOUND WITH TENDON REPAIR (Right) as a surgical intervention .  The patient's history has been reviewed, patient examined, no change in status, stable for surgery.  I have reviewed the patient's chart and labs.  Questions were answered to the patient's satisfaction.     Salvatore Marvel A

## 2012-06-30 NOTE — Progress Notes (Signed)
Report given to philip rn as cargiver 

## 2012-06-30 NOTE — Progress Notes (Signed)
   CARE MANAGEMENT NOTE 06/30/2012  Patient:  Chase [name], Chase [name]   Account Number:  [id number]  Date Initiated:  06/30/2012  Documentation initiated by:  [facility]  Subjective/Objective Assessment:   admitted after fall, opened old knee incision, for Iand D, tendon repair     Action/Plan:   lives at home with wife, no NCM needs identified.   Anticipated DC Date:  07/03/2012   Anticipated DC Plan:  HOME/SELF CARE      DC Planning Services  CM consult      Choice offered to / List presented to:             Status of service:  Completed, signed off Medicare Important Message given?   (If response is "NO", the following Medicare IM given date fields will be blank) Date Medicare IM given:   Date Additional Medicare IM given:    Discharge Disposition:  HOME/SELF CARE  Per UR Regulation:  Reviewed for med. necessity/level of care/duration of stay  If discussed at Long Length of Stay Meetings, dates discussed:    Comments:  06/30/2012 1345 NCM spoke to pt and state he has cane and crutches at home. States he was able to ambulate without any complications post surgery on 12/2011. He completed his rehab and was doing well. He states he will not need any HH or therapy post dc. Wife, Chase [name] at home to assist him with his care. States MD has reviewed with him s/s of infection to report and any other complications post dc. Isidoro Donning RN CCM Case Mgmt phone [phone]

## 2012-06-30 NOTE — Anesthesia Postprocedure Evaluation (Signed)
Anesthesia Post Note  [name]  Procedure(s) Performed: Procedure(s) (LRB): INCISION AND DRAINAGE (Right) KNEE BURSECTOMY (Right)  Anesthesia type: general  Patient location: PACU  Post pain: Pain level controlled  Post assessment: Patient's Cardiovascular Status Stable  Last Vitals:  Filed Vitals:   06/30/12 1045  BP:   Pulse: 72  Temp:   Resp: 14    Post vital signs: Reviewed and stable  Level of consciousness: sedated  Complications: No apparent anesthesia complications

## 2012-06-30 NOTE — Progress Notes (Signed)
ANTICOAGULATION CONSULT NOTE - Initial Consult  Pharmacy Consult for coumadin Indication: VTE prophylaxis  No Known Allergies  Patient Measurements: Height: 5\' 11"  (180.3 cm) Weight: 215 lb (97.523 kg) IBW/kg (Calculated) : 75.3  Vital Signs: Temp: 97.9 F (36.6 C) (03/05 1046) Temp src: Oral (03/05 0557) BP: 137/81 mmHg (03/05 1034) Pulse Rate: 72 (03/05 1045)  Labs:  Recent Labs  06/29/12 2103  HGB 14.4  HCT 39.4  PLT 172  APTT 30  LABPROT 19.9*  INR 1.76*  CREATININE 0.95    Estimated Creatinine Clearance: 97.2 ml/min (by C-G formula based on Cr of 0.95).   Medical History: Past Medical History  Diagnosis Date  . History of ischemic vertebrobasilar artery thalamic stroke 2001  . Patent foramen ovale   . DVT (deep venous thrombosis)   . Hypertension   . Hearing deficit   . Right knee DJD   . Stroke 2001  . History of stroke   . Hyperlipidemia   . Clotting disorder     Medications:  Prescriptions prior to admission  Medication Sig Dispense Refill  . amLODipine-benazepril (LOTREL) 10-20 MG per capsule Take 1 capsule by mouth daily.  30 capsule  6  . venlafaxine XR (EFFEXOR-XR) 150 MG 24 hr capsule Take 150 mg by mouth daily.      Marland Kitchen warfarin (COUMADIN) 5 MG tablet Take 10 mg by mouth daily.      Marland Kitchen docusate sodium 100 MG CAPS Take 100 mg by mouth 2 (two) times daily.  60 capsule  0  . enoxaparin (LOVENOX) 30 MG/0.3ML injection Inject 0.3 mLs (30 mg total) into the skin every 12 (twelve) hours.  24 Syringe  0  . LORazepam (ATIVAN) 1 MG tablet Take 1 tablet (1 mg total) by mouth every 8 (eight) hours as needed for anxiety.  30 tablet  0  . oxyCODONE (OXY IR/ROXICODONE) 5 MG immediate release tablet Take 3 tablets (15 mg total) by mouth every 3 (three) hours as needed for pain.  100 tablet  0  . oxyCODONE (OXYCONTIN) 20 MG 12 hr tablet Take 1 tablet (20 mg total) by mouth every 12 (twelve) hours.  20 tablet  0  . potassium gluconate 595 MG TABS Take 595 mg by  mouth daily.       Scheduled:  . [MAR HOLD] amLODipine  10 mg Oral Daily   And  . [MAR HOLD] benazepril  20 mg Oral Daily  . bisacodyl  5 mg Oral Q1200  . celecoxib  200 mg Oral Q12H  . [facility] HOLD] docusate sodium  100 mg Oral BID  . HYDROmorphone      . [COMPLETED] vancomycin  1,500 mg Intravenous Once  . vancomycin  1,500 mg Intravenous Q8H  . [facility] HOLD] venlafaxine XR  150 mg Oral Daily  . warfarin  10 mg Oral Daily  . [DISCONTINUED] amLODipine-benazepril  1 capsule Oral Daily  . [DISCONTINUED] amLODipine-benazepril  1 capsule Oral Daily  . [DISCONTINUED] aspirin EC  325 mg Oral Daily  . [DISCONTINUED] docusate sodium  100 mg Oral BID  . [DISCONTINUED] vancomycin  1,500 mg Intravenous On Call to OR  . [DISCONTINUED] vancomycin  1,500 mg Intravenous To OR    Assessment: 62 yo male here with R knee laceration s/p fall now noted s/p I&D.  He was on coumadin PTA for history of DVT and CVA (per Hematology at [facility] to be anticoagulated indefinitely). INR today is 1.75. Last dose of coumadin was 06/29/12.  Home coumadin regimen: 10mg /day  Goal of Therapy:  INR 2-3 Monitor platelets by anticoagulation protocol: Yes   Plan:  -Coumadin 12.5mg  po today -Daily PT/INR  Harland German, Pharm D 06/30/2012 11:33 AM

## 2012-06-30 NOTE — H&P (View-Only) (Signed)
Chase [name] is an 61 y.o. male.   Chief Complaint: right knee laceration over total knee HPI: 61 yowm 6 months s/p right total knee fell of the second rung of a ladder today.  Open wound 3inches by 1 inch deep.  Xrays neg.  To OR for I and D in am.  Admitted tonight for IV vancomycin  Past Medical History  Diagnosis Date  . History of ischemic vertebrobasilar artery thalamic stroke 2001  . Patent foramen ovale   . DVT (deep venous thrombosis)   . Hypertension   . Hearing deficit   . Right knee DJD   . Stroke 2001  . History of stroke   . Hyperlipidemia   . Clotting disorder     Past Surgical History  Procedure Laterality Date  . Knee arthroscopy      right knee  . Fracture surgery      R leg  . Appendectomy    . Total knee arthroplasty  01/26/2012    Procedure: TOTAL KNEE ARTHROPLASTY;  Surgeon: Robert A Wainer, MD;  Location: [facility] OR;  Service: Orthopedics;  Laterality: Right;    Family History  Problem Relation Age of Onset  . Cancer Father   . Seizures Brother    Social History:  reports that he has been smoking Cigarettes.  He has a 98 pack-year smoking history. He does not have any smokeless tobacco history on file. He reports that he drinks about 25.2 ounces of alcohol per week. He reports that he does not use illicit drugs.  Allergies: No Known Allergies  Current Outpatient Prescriptions on File Prior to Visit  Medication Sig Dispense Refill  . amLODipine-benazepril (LOTREL) 10-20 MG per capsule Take 1 capsule by mouth daily.  30 capsule  6  . potassium gluconate 595 MG TABS Take 595 mg by mouth daily.      . venlafaxine XR (EFFEXOR-XR) 150 MG 24 hr capsule Take 150 mg by mouth daily.      . warfarin (COUMADIN) 5 MG tablet Take 10 mg by mouth daily.      . docusate sodium 100 MG CAPS Take 100 mg by mouth 2 (two) times daily.  60 capsule  0  . enoxaparin (LOVENOX) 30 MG/0.3ML injection Inject 0.3 mLs (30 mg total) into the skin every 12 (twelve) hours.  24 Syringe   0  . LORazepam (ATIVAN) 1 MG tablet Take 1 tablet (1 mg total) by mouth every 8 (eight) hours as needed for anxiety.  30 tablet  0  . oxyCODONE (OXY IR/ROXICODONE) 5 MG immediate release tablet Take 3 tablets (15 mg total) by mouth every 3 (three) hours as needed for pain.  100 tablet  0  . oxyCODONE (OXYCONTIN) 20 MG 12 hr tablet Take 1 tablet (20 mg total) by mouth every 12 (twelve) hours.  20 tablet  0   No current facility-administered medications on file prior to visit.    (Not in a hospital admission)  No results found for this or any previous visit (from the past 48 hour(s)). No results found.  Review of Systems  Constitutional: Negative.   HENT: Positive for hearing loss.   Eyes: Negative.   Respiratory: Negative.   Cardiovascular: Positive for leg swelling. Negative for chest pain, palpitations, orthopnea, claudication and PND.  Gastrointestinal: Negative.   Genitourinary: Negative.   Musculoskeletal: Positive for joint pain.       Right knee open wound  Skin: Negative.   Neurological:         No short term memory due to stroke.  Address all questions to his wife  Endo/Heme/Allergies: Negative for environmental allergies and polydipsia. Bruises/bleeds easily.  Psychiatric/Behavioral: Negative.     Blood pressure 132/87, pulse 83, temperature 98.1 F (36.7 C), height 5' 11" (1.803 m), weight 97.523 kg (215 lb). Physical Exam  Constitutional: He is oriented to person, place, and time. He appears well-developed and well-nourished.  HENT:  Head: Normocephalic and atraumatic.  Eyes: Conjunctivae and EOM are normal. Pupils are equal, round, and reactive to light.  Neck: Neck supple.  Musculoskeletal:  Right knee open wound 1/2 to 1 inch deep.  bleeding  Neurological: He is alert and oriented to person, place, and time.  Skin: Skin is warm and dry.     Assessment Patient Active Problem List  Diagnosis  . History of ischemic vertebrobasilar artery thalamic stroke  .  Patent foramen ovale  . DVT (deep venous thrombosis)  . Hypertension  . Hearing deficit  . Right knee DJD  . Preoperative cardiovascular examination  . Tortuous aorta  . History of stroke  . Open wound of knee,   . S/P right total knee replacement      Plan Admit tonight for IV antibiotics.  To OR in AM for I and D.  Cap Massi J 06/29/2012, 7:37 PM    

## 2012-06-30 NOTE — Preoperative (Signed)
Beta Blockers   Reason not to administer Beta Blockers:Not Applicable 

## 2012-06-30 NOTE — Anesthesia Procedure Notes (Signed)
Procedure Name: LMA Insertion Date/Time: 06/30/2012 8:28 AM Performed by: Marena Chancy Pre-anesthesia Checklist: Patient identified, Timeout performed, Emergency Drugs available, Suction available and Patient being monitored Patient Re-evaluated:Patient Re-evaluated prior to inductionOxygen Delivery Method: Circle system utilized Preoxygenation: Pre-oxygenation with 100% oxygen Intubation Type: IV induction LMA: LMA inserted LMA Size: 5.0 Tube secured with: Tape Dental Injury: Teeth and Oropharynx as per pre-operative assessment

## 2012-06-30 NOTE — Op Note (Signed)
NAMEHERSEL, MCMEEN              ACCOUNT NO.:  [id number]  MEDICAL RECORD NO.:  [id number]  LOCATION:  [id number]                        FACILITY:  [facility]  PHYSICIAN:  Elana Alm. Thurston Hole, M.D. DATE OF BIRTH:  [birth year]  DATE OF PROCEDURE:  06/30/2012 DATE OF DISCHARGE:                              OPERATIVE REPORT   PREOPERATIVE DIAGNOSES: 1. Right knee traumatic wound with traumatic prepatellar bursitis. 2. Five months status post right total knee replacement.  POSTOPERATIVE DIAGNOSES: 1. Right knee traumatic wound with traumatic prepatellar bursitis. 2. Five months status post right total knee replacement.  PROCEDURE: 1. Right knee irrigation and debridement. 2. Right knee prepatellar bursectomy. 3. Right knee traumatic wound closure.  SURGEON:  Elana Alm. Thurston Hole, MD  ASSISTANT:  Julien Girt, PA-C  ANESTHESIA:  General.  OPERATIVE TIME:  40 minutes.  COMPLICATIONS:  None.  INDICATION FOR PROCEDURE:  Mr. Crymes is a 62 year old gentleman, who fell yesterday 5 feet off of a ladder landing on his right total knee which was 5 months postop.  He has a traumatic wound in this area.  Exam and x-rays showed that the total knee prosthesis is in excellent position and he is now to undergo wound exploration, prepatellar bursectomy from traumatic prepatellar bursitis and wound irrigation, debridement and closure.  DESCRIPTION OF PROCEDURE:  Mr. Bottger was brought to the operating room on June 30, 2012, placed on operative table in supine position. After being placed under general anesthesia, his right knee was examined.  He had full range of motion.  Knee was stable ligamentous exam with normal patellar tracking.  He received vancomycin 1.5 g IV preoperatively for prophylaxis.  The right leg was prepped using sterile Betadine and draped using sterile technique.  Time-out procedure was called and the correct right knee identified.  Right leg was exsanguinated and a thigh  tourniquet elevated 365 mm.  Initially through a 45 cm longitudinal incision, initial exposure was made going through the transverse traumatic wound which was 2 cm wide over the patellar tendon.  The subcutaneous tissues showed significant hemorrhagic prepatellar bursitis which was thoroughly debrided.  The patellar tendon was intact and his previous arthrotomy incision was completely healed. This wound did not enter into the knee joint itself on thorough exploration.  After thorough debridement, the prepatellar bursectomy was carried out and debridement of all of the contaminated subcutaneous soft tissues, the wound was thoroughly irrigated with 3 L of jet lavage saline solution.  After this was done, the tourniquet was then released and the wound closed with interrupted 2- 0 nylon suture.  Sterile dressings were applied and then the patient awakened and taken to recovery room in stable condition.  Needle and sponge counts were correct x2 at the end of the case.     Robert A. Thurston Hole, M.D.     RAW/MEDQ  D:  06/30/2012  T:  06/30/2012  Job:  [id number]

## 2012-07-01 ENCOUNTER — Encounter (HOSPITAL_COMMUNITY): Payer: Self-pay | Admitting: Orthopedic Surgery

## 2012-07-01 LAB — CBC
HCT: 36.8 % — ABNORMAL LOW (ref 39.0–52.0)
MCHC: 35.3 g/dL (ref 30.0–36.0)
Platelets: 151 10*3/uL (ref 150–400)
RDW: 15.3 % (ref 11.5–15.5)
WBC: 8 10*3/uL (ref 4.0–10.5)

## 2012-07-01 LAB — PROTIME-INR: INR: 1.21 (ref 0.00–1.49)

## 2012-07-01 MED ORDER — ACETAMINOPHEN 325 MG PO TABS: 650.0000 mg | ORAL_TABLET | Freq: Four times a day (QID) | ORAL | Status: DC | PRN

## 2012-07-01 MED ORDER — HYDROCODONE-ACETAMINOPHEN 5-325 MG PO TABS: 1.0000 | ORAL_TABLET | ORAL | Status: DC | PRN

## 2012-07-01 MED ORDER — DSS 100 MG PO CAPS: 100.0000 mg | ORAL_CAPSULE | Freq: Two times a day (BID) | ORAL | Status: DC

## 2012-07-01 MED ORDER — ENOXAPARIN SODIUM 30 MG/0.3ML ~~LOC~~ SOLN: 30.0000 mg | Freq: Two times a day (BID) | SUBCUTANEOUS | Status: DC

## 2012-07-01 MED ORDER — CELECOXIB 200 MG PO CAPS: 200.0000 mg | ORAL_CAPSULE | Freq: Every day | ORAL | Status: DC

## 2012-07-01 MED ORDER — CEPHALEXIN 500 MG PO CAPS: 500.0000 mg | ORAL_CAPSULE | Freq: Four times a day (QID) | ORAL | Status: DC

## 2012-07-01 MED ORDER — BISACODYL 5 MG PO TBEC: DELAYED_RELEASE_TABLET | ORAL | Status: DC

## 2012-07-01 MED ORDER — ENOXAPARIN SODIUM 30 MG/0.3ML ~~LOC~~ SOLN
30.0000 mg | Freq: Two times a day (BID) | SUBCUTANEOUS | Status: DC
  Administered 2012-07-01: 30 mg via SUBCUTANEOUS
  Filled 2012-07-01 (×3): qty 0.3

## 2012-07-01 NOTE — Progress Notes (Signed)
Patient ID: Chase [name], male   DOB: 07-21-1950, 62 y.o.   MRN: [id number] 07/02/2011 Pt received 12.5 of coumadin last night.  Despite this his INR is 1.2   Due to his heart defect and history of a stroke we will start Lovenox until his INR is therapeutic.  Kirstin A. Gwinda Passe Physician Assistant [facility] [phone]  07/01/2012, 7:44 AM

## 2012-07-01 NOTE — Discharge Summary (Signed)
Patient ID: Chase [name] MRN: [id number] DOB/AGE: 12-14-1950 62 y.o.  Admit date: 06/29/2012 Discharge date: 07/01/2012  Admission Diagnoses:  Principal Problem:   Open wound of knee,  Active Problems:   History of ischemic vertebrobasilar artery thalamic stroke   Patent foramen ovale   DVT (deep venous thrombosis)   Hypertension   Hearing deficit   Right knee DJD   Preoperative cardiovascular examination   Tortuous aorta   History of stroke   S/P right total knee replacement   Discharge Diagnoses:  Same  Past Medical History  Diagnosis Date  . History of ischemic vertebrobasilar artery thalamic stroke 2001  . Patent foramen ovale   . DVT (deep venous thrombosis)   . Hypertension   . Hearing deficit   . Right knee DJD   . Stroke 2001  . History of stroke   . Hyperlipidemia   . Clotting disorder     Surgeries: Procedure(s): INCISION AND DRAINAGE KNEE BURSECTOMY on 06/29/2012 - 06/30/2012   Consultants:    Discharged Condition: Improved  Hospital Course: Chase [name] is an 62 y.o. male who was admitted 06/29/2012 for operative treatment ofOpen wound of knee, leg (except thigh), and ankle, complicated. Patient has severe unremitting pain that affects sleep, daily activities, and work/hobbies. After pre-op clearance the patient was taken to the operating room on 06/29/2012 - 06/30/2012 and underwent  Procedure(s): INCISION AND DRAINAGE KNEE BURSECTOMY.    Patient was given perioperative antibiotics: Anti-infectives   Start     Dose/Rate Route Frequency Ordered Stop   07/01/12 0000  cephALEXin (KEFLEX) 500 MG capsule     500 mg Oral 4 times daily 07/01/12 0752     06/30/12 1600  vancomycin (VANCOCIN) 1,500 mg in sodium chloride 0.9 % 500 mL IVPB     1,500 mg 250 mL/hr over 120 Minutes Intravenous Every 8 hours 06/30/12 1120 06/30/12 1751   06/30/12 0830  vancomycin (VANCOCIN) 1,500 mg in sodium chloride 0.9 % 500 mL IVPB  Status:  Discontinued     1,500 mg 250 mL/hr  over 120 Minutes Intravenous To Surgery 06/30/12 0822 06/30/12 1120   06/30/12 0600  vancomycin (VANCOCIN) 1,500 mg in sodium chloride 0.9 % 500 mL IVPB  Status:  Discontinued     1,500 mg 250 mL/hr over 120 Minutes Intravenous On call to O.R. 06/29/12 2013 06/29/12 2347   06/30/12 0000  vancomycin (VANCOCIN) 1,500 mg in sodium chloride 0.9 % 500 mL IVPB     1,500 mg 250 mL/hr over 120 Minutes Intravenous  Once 06/29/12 2347 06/30/12 0211       Patient was given sequential compression devices, early ambulation, and chemoprophylaxis to prevent DVT.  Patient was discharged home on Keflex due to wound contamination by falling off a ladder in his garage and having Debr in the wound.  Patient benefited maximally from hospital stay and there were no complications.    Recent vital signs: Patient Vitals for the past 24 hrs:  BP Temp Temp src Pulse Resp SpO2  07/01/12 0600 147/79 mmHg 97.4 F (36.3 C) Oral 60 18 98 %  07/01/12 0400 - - - - 17 97 %  07/01/12 0200 131/80 mmHg 97.1 F (36.2 C) Oral 64 18 98 %  07/01/12 0000 - - - - 18 97 %  06/30/12 2200 111/78 mmHg 98.1 F (36.7 C) Oral 70 18 98 %  06/30/12 2000 - - - - 17 98 %  06/30/12 1814 139/79 mmHg 98.2 F (36.8 C) Oral 70  18 99 %  06/30/12 1600 - - - - 18 96 %  06/30/12 1434 124/77 mmHg 98.5 F (36.9 C) Oral 76 18 96 %  06/30/12 1200 - - - - 16 98 %  06/30/12 1116 138/62 mmHg 98.4 F (36.9 C) Oral 68 18 98 %  06/30/12 1046 - 97.9 F (36.6 C) - - - -  06/30/12 1045 - - - 72 14 94 %  06/30/12 1034 137/81 mmHg - - 72 14 94 %  06/30/12 1030 - - - 73 13 94 %  06/30/12 1019 133/81 mmHg - - 69 12 94 %  06/30/12 1015 - - - 71 12 94 %  06/30/12 1004 136/84 mmHg - - 69 12 93 %  06/30/12 1000 - - - 68 13 94 %  06/30/12 0949 139/88 mmHg - - 67 13 96 %  06/30/12 0945 - - - 69 14 95 %  06/30/12 0935 146/88 mmHg - - 80 19 97 %  06/30/12 0930 178/98 mmHg 98.4 F (36.9 C) - 72 12 97 %  06/30/12 0920 178/98 mmHg - - 74 12 97 %  06/30/12  0918 - - - 84 23 99 %     Recent laboratory studies:  Recent Labs  06/29/12 2103 07/01/12 0624  WBC 9.9 8.0  HGB 14.4 13.0  HCT 39.4 36.8*  PLT 172 151  NA 141  --   K 3.8  --   CL 105  --   CO2 22  --   BUN 18  --   CREATININE 0.95  --   GLUCOSE 94  --   INR 1.76* 1.21  CALCIUM 9.1  --      Discharge Medications:     Medication List    TAKE these medications       acetaminophen 325 MG tablet  Commonly known as:  TYLENOL  Take 2 tablets (650 mg total) by mouth every 6 (six) hours as needed for pain.     amLODipine-benazepril 10-20 MG per capsule  Commonly known as:  LOTREL  Take 1 capsule by mouth daily.     bisacodyl 5 MG EC tablet  Commonly known as:  DULCOLAX  Take 2 tablets every night with dinner until bowel movement.  LAXITIVE.  Restart if two days since last bowel movement     celecoxib 200 MG capsule  Commonly known as:  CELEBREX  Take 1 capsule (200 mg total) by mouth daily.     cephALEXin 500 MG capsule  Commonly known as:  KEFLEX  Take 1 capsule (500 mg total) by mouth 4 (four) times daily.     DSS 100 MG Caps  Take 100 mg by mouth 2 (two) times daily.     enoxaparin 30 MG/0.3ML injection  Commonly known as:  LOVENOX  Inject 0.3 mLs (30 mg total) into the skin every 12 (twelve) hours.     HYDROcodone-acetaminophen 5-325 MG per tablet  Commonly known as:  NORCO/VICODIN  Take 1-2 tablets by mouth every 4 (four) hours as needed for pain.     venlafaxine XR 150 MG 24 hr capsule  Commonly known as:  EFFEXOR-XR  Take 150 mg by mouth daily.     warfarin 5 MG tablet  Commonly known as:  COUMADIN  Take 5 mg by mouth daily. Patient takes 10 mg daily, except Mon's and Fri's-takes 5 mg.        Diagnostic Studies: Dg Chest Portable 1 View  06/29/2012  *RADIOLOGY REPORT*  Clinical  Data: Preoperative chest radiograph; history of smoking.  PORTABLE CHEST - 1 VIEW  Comparison: Chest radiograph performed 01/19/2012  Findings: The lungs are well-aerated  and clear.  There is no evidence of focal opacification, pleural effusion or pneumothorax.  The cardiomediastinal silhouette is normal in size; calcification is noted within the aortic arch.  No acute osseous abnormalities are seen.  IMPRESSION: No acute cardiopulmonary process seen.   Original Report Authenticated By: Tonia Ghent, M.D.     Disposition: 01-Home or Self Care      Discharge Orders   Future Orders Complete By Expires     Diet - low sodium heart healthy  As directed     Discharge instructions  As directed     Comments:      Knee Surgery Care After Refer to this sheet in the next few weeks. These discharge instructions provide you with general information on caring for yourself after you leave the hospital. Your caregiver may also give you specific instructions. Your treatment has been planned according to the most current medical practices available, but unavoidable complications sometimes occur. If you have any problems or questions after discharge, please call your caregiver. HOME INSTRUCTIONS You may resume a normal diet and activities as directed. Perform exercises as directed.  DO NOT GET WOUND WET Change gauze bandages (dressings) in daily.  Cover wounds with Gauze and TED hose. Only take over-the-counter or prescription medicines for pain, discomfort, or fever as directed by your caregiver.  Eat a well-balanced diet.  Avoid lifting or driving until you are instructed otherwise.  Make an appointment to see your caregiver for stitches (suture) or staple removal as directed.   SEEK MEDICAL CARE IF: You have swelling of your calf or leg.  You develop shortness of breath or chest pain.  You have redness, swelling, or increasing pain in the wound.  There is pus or any unusual drainage coming from the surgical site.  You notice a bad smell coming from the surgical site or dressing.  The surgical site breaks open after sutures or staples have been removed.  There is  persistent bleeding from the suture or staple line.  You are getting worse or are not improving.  You have any other questions or concerns.  SEEK IMMEDIATE MEDICAL CARE IF:  You have a fever.  You develop a rash.  You have difficulty breathing.  You develop any reaction or side effects to medicines given.  Your knee motion is decreasing rather than improving.  MAKE SURE YOU:  Understand these instructions.  Will watch your condition.  Will get help right away if you are not doing well or get worse.    Increase activity slowly  As directed        Follow-up Information   Follow up with Nilda Simmer, MD On 07/05/2012. (appt time 3:15)    Contact information:   [address] [place] [address] [phone]        Signed: Pascal Lux 07/01/2012, 7:54 AM

## 2012-07-01 NOTE — Progress Notes (Signed)
PT Cancellation Note  Patient Details Name: Chase [name] MRN: [id number] DOB: [birth year]   Cancelled Treatment:    Reason Eval/Treat Not Completed: Other (comment) (pt. independent/no PT needs identified).  He is up in room ad lib (no device) with no observable difficulty ambulating and able to single leg stance bilaterally to imitate negotiating steps.  Will sign off.  Pt. DCing home this am.   Ferman Hamming 07/01/2012, 8:10 AM Weldon Picking PT [facility] [phone] [facility] [phone]

## 2012-07-01 NOTE — Care Management Note (Signed)
    Page 1 of 1   07/01/2012     9:34:02 AM   CARE MANAGEMENT NOTE 07/01/2012  Patient:  Chase [name], Chase [name]   Account Number:  [id number]  Date Initiated:  06/30/2012  Documentation initiated by:  [facility]  Subjective/Objective Assessment:   admitted after fall, opened old knee incision, for Iand D, tendon repair     Action/Plan:   lives at home with wife, no NCM needs identified.   Anticipated DC Date:  07/03/2012   Anticipated DC Plan:  HOME/SELF CARE      DC Planning Services  CM consult      Choice offered to / List presented to:             Status of service:  Completed, signed off Medicare Important Message given?   (If response is "NO", the following Medicare IM given date fields will be blank) Date Medicare IM given:   Date Additional Medicare IM given:    Discharge Disposition:  HOME/SELF CARE  Per UR Regulation:  Reviewed for med. necessity/level of care/duration of stay  If discussed at Long Length of Stay Meetings, dates discussed:    Comments:  06/30/2012 1345 NCM spoke to pt and state he has cane and crutches at home. States he was able to ambulate without any complications post surgery in 12/2011. He completed his rehab and was doing well. He states he will not need any HH or therapy post dc. Wife, Bon Dowis at home to assist him with his care. States MD has reviewed with him s/s of infection to report and any other complications post dc. Isidoro Donning RN CCM Case Mgmt phone [phone]

## 2012-07-15 DIAGNOSIS — F17200 Nicotine dependence, unspecified, uncomplicated: Secondary | ICD-10-CM | POA: Diagnosis not present

## 2012-07-15 DIAGNOSIS — Z8673 Personal history of transient ischemic attack (TIA), and cerebral infarction without residual deficits: Secondary | ICD-10-CM | POA: Diagnosis not present

## 2012-07-15 DIAGNOSIS — M79609 Pain in unspecified limb: Secondary | ICD-10-CM | POA: Diagnosis not present

## 2012-07-15 DIAGNOSIS — M62838 Other muscle spasm: Secondary | ICD-10-CM | POA: Diagnosis not present

## 2012-08-12 DIAGNOSIS — M62838 Other muscle spasm: Secondary | ICD-10-CM | POA: Diagnosis not present

## 2012-08-12 DIAGNOSIS — F17200 Nicotine dependence, unspecified, uncomplicated: Secondary | ICD-10-CM | POA: Diagnosis not present

## 2012-08-12 DIAGNOSIS — M79609 Pain in unspecified limb: Secondary | ICD-10-CM | POA: Diagnosis not present

## 2012-08-12 DIAGNOSIS — Z8673 Personal history of transient ischemic attack (TIA), and cerebral infarction without residual deficits: Secondary | ICD-10-CM | POA: Diagnosis not present

## 2012-09-16 DIAGNOSIS — M62838 Other muscle spasm: Secondary | ICD-10-CM | POA: Diagnosis not present

## 2012-09-16 DIAGNOSIS — F17200 Nicotine dependence, unspecified, uncomplicated: Secondary | ICD-10-CM | POA: Diagnosis not present

## 2012-09-16 DIAGNOSIS — M79609 Pain in unspecified limb: Secondary | ICD-10-CM | POA: Diagnosis not present

## 2012-09-16 DIAGNOSIS — Z8673 Personal history of transient ischemic attack (TIA), and cerebral infarction without residual deficits: Secondary | ICD-10-CM | POA: Diagnosis not present

## 2012-10-19 DIAGNOSIS — M62838 Other muscle spasm: Secondary | ICD-10-CM | POA: Diagnosis not present

## 2012-10-19 DIAGNOSIS — F17200 Nicotine dependence, unspecified, uncomplicated: Secondary | ICD-10-CM | POA: Diagnosis not present

## 2012-10-19 DIAGNOSIS — Z8673 Personal history of transient ischemic attack (TIA), and cerebral infarction without residual deficits: Secondary | ICD-10-CM | POA: Diagnosis not present

## 2012-10-19 DIAGNOSIS — M79609 Pain in unspecified limb: Secondary | ICD-10-CM | POA: Diagnosis not present

## 2012-11-10 DIAGNOSIS — Z4789 Encounter for other orthopedic aftercare: Secondary | ICD-10-CM | POA: Diagnosis not present

## 2012-11-16 DIAGNOSIS — F17200 Nicotine dependence, unspecified, uncomplicated: Secondary | ICD-10-CM | POA: Diagnosis not present

## 2012-11-16 DIAGNOSIS — M62838 Other muscle spasm: Secondary | ICD-10-CM | POA: Diagnosis not present

## 2012-11-16 DIAGNOSIS — M79609 Pain in unspecified limb: Secondary | ICD-10-CM | POA: Diagnosis not present

## 2012-11-16 DIAGNOSIS — Z8673 Personal history of transient ischemic attack (TIA), and cerebral infarction without residual deficits: Secondary | ICD-10-CM | POA: Diagnosis not present

## 2013-01-17 DIAGNOSIS — M79609 Pain in unspecified limb: Secondary | ICD-10-CM | POA: Diagnosis not present

## 2013-01-17 DIAGNOSIS — F17200 Nicotine dependence, unspecified, uncomplicated: Secondary | ICD-10-CM | POA: Diagnosis not present

## 2013-01-17 DIAGNOSIS — Z86718 Personal history of other venous thrombosis and embolism: Secondary | ICD-10-CM | POA: Diagnosis not present

## 2013-01-17 DIAGNOSIS — J019 Acute sinusitis, unspecified: Secondary | ICD-10-CM | POA: Diagnosis not present

## 2013-01-24 DIAGNOSIS — J019 Acute sinusitis, unspecified: Secondary | ICD-10-CM | POA: Diagnosis not present

## 2013-01-24 DIAGNOSIS — Z86718 Personal history of other venous thrombosis and embolism: Secondary | ICD-10-CM | POA: Diagnosis not present

## 2013-01-24 DIAGNOSIS — M79609 Pain in unspecified limb: Secondary | ICD-10-CM | POA: Diagnosis not present

## 2013-01-24 DIAGNOSIS — F17200 Nicotine dependence, unspecified, uncomplicated: Secondary | ICD-10-CM | POA: Diagnosis not present

## 2013-02-15 DIAGNOSIS — F17200 Nicotine dependence, unspecified, uncomplicated: Secondary | ICD-10-CM | POA: Diagnosis not present

## 2013-02-15 DIAGNOSIS — Z86718 Personal history of other venous thrombosis and embolism: Secondary | ICD-10-CM | POA: Diagnosis not present

## 2013-02-15 DIAGNOSIS — J019 Acute sinusitis, unspecified: Secondary | ICD-10-CM | POA: Diagnosis not present

## 2013-02-15 DIAGNOSIS — M79609 Pain in unspecified limb: Secondary | ICD-10-CM | POA: Diagnosis not present

## 2013-03-03 DIAGNOSIS — Z86718 Personal history of other venous thrombosis and embolism: Secondary | ICD-10-CM | POA: Diagnosis not present

## 2013-03-03 DIAGNOSIS — I6990 Unspecified sequelae of unspecified cerebrovascular disease: Secondary | ICD-10-CM | POA: Diagnosis not present

## 2013-03-03 DIAGNOSIS — J019 Acute sinusitis, unspecified: Secondary | ICD-10-CM | POA: Diagnosis not present

## 2013-03-03 DIAGNOSIS — F17200 Nicotine dependence, unspecified, uncomplicated: Secondary | ICD-10-CM | POA: Diagnosis not present

## 2013-04-05 DIAGNOSIS — Z86718 Personal history of other venous thrombosis and embolism: Secondary | ICD-10-CM | POA: Diagnosis not present

## 2013-04-05 DIAGNOSIS — J019 Acute sinusitis, unspecified: Secondary | ICD-10-CM | POA: Diagnosis not present

## 2013-04-05 DIAGNOSIS — I6990 Unspecified sequelae of unspecified cerebrovascular disease: Secondary | ICD-10-CM | POA: Diagnosis not present

## 2013-04-05 DIAGNOSIS — F17200 Nicotine dependence, unspecified, uncomplicated: Secondary | ICD-10-CM | POA: Diagnosis not present

## 2013-05-05 DIAGNOSIS — F17200 Nicotine dependence, unspecified, uncomplicated: Secondary | ICD-10-CM | POA: Diagnosis not present

## 2013-05-05 DIAGNOSIS — I6990 Unspecified sequelae of unspecified cerebrovascular disease: Secondary | ICD-10-CM | POA: Diagnosis not present

## 2013-05-05 DIAGNOSIS — Z86718 Personal history of other venous thrombosis and embolism: Secondary | ICD-10-CM | POA: Diagnosis not present

## 2013-05-05 DIAGNOSIS — Z8673 Personal history of transient ischemic attack (TIA), and cerebral infarction without residual deficits: Secondary | ICD-10-CM | POA: Diagnosis not present

## 2013-06-02 DIAGNOSIS — F17200 Nicotine dependence, unspecified, uncomplicated: Secondary | ICD-10-CM | POA: Diagnosis not present

## 2013-06-02 DIAGNOSIS — I6990 Unspecified sequelae of unspecified cerebrovascular disease: Secondary | ICD-10-CM | POA: Diagnosis not present

## 2013-06-02 DIAGNOSIS — Z86718 Personal history of other venous thrombosis and embolism: Secondary | ICD-10-CM | POA: Diagnosis not present

## 2013-06-02 DIAGNOSIS — I6789 Other cerebrovascular disease: Secondary | ICD-10-CM | POA: Diagnosis not present

## 2013-06-30 DIAGNOSIS — Z8673 Personal history of transient ischemic attack (TIA), and cerebral infarction without residual deficits: Secondary | ICD-10-CM | POA: Diagnosis not present

## 2013-06-30 DIAGNOSIS — I6789 Other cerebrovascular disease: Secondary | ICD-10-CM | POA: Diagnosis not present

## 2013-06-30 DIAGNOSIS — I6990 Unspecified sequelae of unspecified cerebrovascular disease: Secondary | ICD-10-CM | POA: Diagnosis not present

## 2013-06-30 DIAGNOSIS — F17200 Nicotine dependence, unspecified, uncomplicated: Secondary | ICD-10-CM | POA: Diagnosis not present

## 2013-08-04 DIAGNOSIS — F17200 Nicotine dependence, unspecified, uncomplicated: Secondary | ICD-10-CM | POA: Diagnosis not present

## 2013-08-04 DIAGNOSIS — Z8673 Personal history of transient ischemic attack (TIA), and cerebral infarction without residual deficits: Secondary | ICD-10-CM | POA: Diagnosis not present

## 2013-08-04 DIAGNOSIS — I6789 Other cerebrovascular disease: Secondary | ICD-10-CM | POA: Diagnosis not present

## 2013-08-04 DIAGNOSIS — I6990 Unspecified sequelae of unspecified cerebrovascular disease: Secondary | ICD-10-CM | POA: Diagnosis not present

## 2013-08-25 DIAGNOSIS — Z8673 Personal history of transient ischemic attack (TIA), and cerebral infarction without residual deficits: Secondary | ICD-10-CM | POA: Diagnosis not present

## 2013-09-27 DIAGNOSIS — I6350 Cerebral infarction due to unspecified occlusion or stenosis of unspecified cerebral artery: Secondary | ICD-10-CM | POA: Diagnosis not present

## 2013-10-27 DIAGNOSIS — Z8673 Personal history of transient ischemic attack (TIA), and cerebral infarction without residual deficits: Secondary | ICD-10-CM | POA: Diagnosis not present

## 2013-11-15 DIAGNOSIS — Z8673 Personal history of transient ischemic attack (TIA), and cerebral infarction without residual deficits: Secondary | ICD-10-CM | POA: Diagnosis not present

## 2013-12-01 DIAGNOSIS — F17200 Nicotine dependence, unspecified, uncomplicated: Secondary | ICD-10-CM | POA: Diagnosis not present

## 2013-12-01 DIAGNOSIS — Z0000 Encounter for general adult medical examination without abnormal findings: Secondary | ICD-10-CM | POA: Diagnosis not present

## 2013-12-01 DIAGNOSIS — R4182 Altered mental status, unspecified: Secondary | ICD-10-CM | POA: Diagnosis not present

## 2013-12-01 DIAGNOSIS — Z8673 Personal history of transient ischemic attack (TIA), and cerebral infarction without residual deficits: Secondary | ICD-10-CM | POA: Diagnosis not present

## 2013-12-01 DIAGNOSIS — F329 Major depressive disorder, single episode, unspecified: Secondary | ICD-10-CM | POA: Diagnosis not present

## 2013-12-01 DIAGNOSIS — I10 Essential (primary) hypertension: Secondary | ICD-10-CM | POA: Diagnosis not present

## 2013-12-01 LAB — LIPID PANEL
CHOLESTEROL: 159 mg/dL (ref 0–200)
LDL Cholesterol: 88 mg/dL
TRIGLYCERIDES: 80 mg/dL (ref 40–160)

## 2013-12-01 LAB — BASIC METABOLIC PANEL: GLUCOSE: 83 mg/dL

## 2014-01-12 DIAGNOSIS — Z8673 Personal history of transient ischemic attack (TIA), and cerebral infarction without residual deficits: Secondary | ICD-10-CM | POA: Diagnosis not present

## 2014-01-12 DIAGNOSIS — F329 Major depressive disorder, single episode, unspecified: Secondary | ICD-10-CM | POA: Diagnosis not present

## 2014-01-12 DIAGNOSIS — R4182 Altered mental status, unspecified: Secondary | ICD-10-CM | POA: Diagnosis not present

## 2014-01-12 DIAGNOSIS — Z0000 Encounter for general adult medical examination without abnormal findings: Secondary | ICD-10-CM | POA: Diagnosis not present

## 2014-01-12 DIAGNOSIS — F17200 Nicotine dependence, unspecified, uncomplicated: Secondary | ICD-10-CM | POA: Diagnosis not present

## 2014-01-12 DIAGNOSIS — I10 Essential (primary) hypertension: Secondary | ICD-10-CM | POA: Diagnosis not present

## 2014-02-16 DIAGNOSIS — Z8673 Personal history of transient ischemic attack (TIA), and cerebral infarction without residual deficits: Secondary | ICD-10-CM | POA: Diagnosis not present

## 2014-03-16 DIAGNOSIS — Z8673 Personal history of transient ischemic attack (TIA), and cerebral infarction without residual deficits: Secondary | ICD-10-CM | POA: Diagnosis not present

## 2014-03-24 IMAGING — CR DG CHEST 2V
2 series · 2 of 2 positions shown · non-contrast
Comparison: None.

CLINICAL DATA: Preoperative radiograph, smoker.

CHEST - 2 VIEW

[view not recorded (1 of 2)]
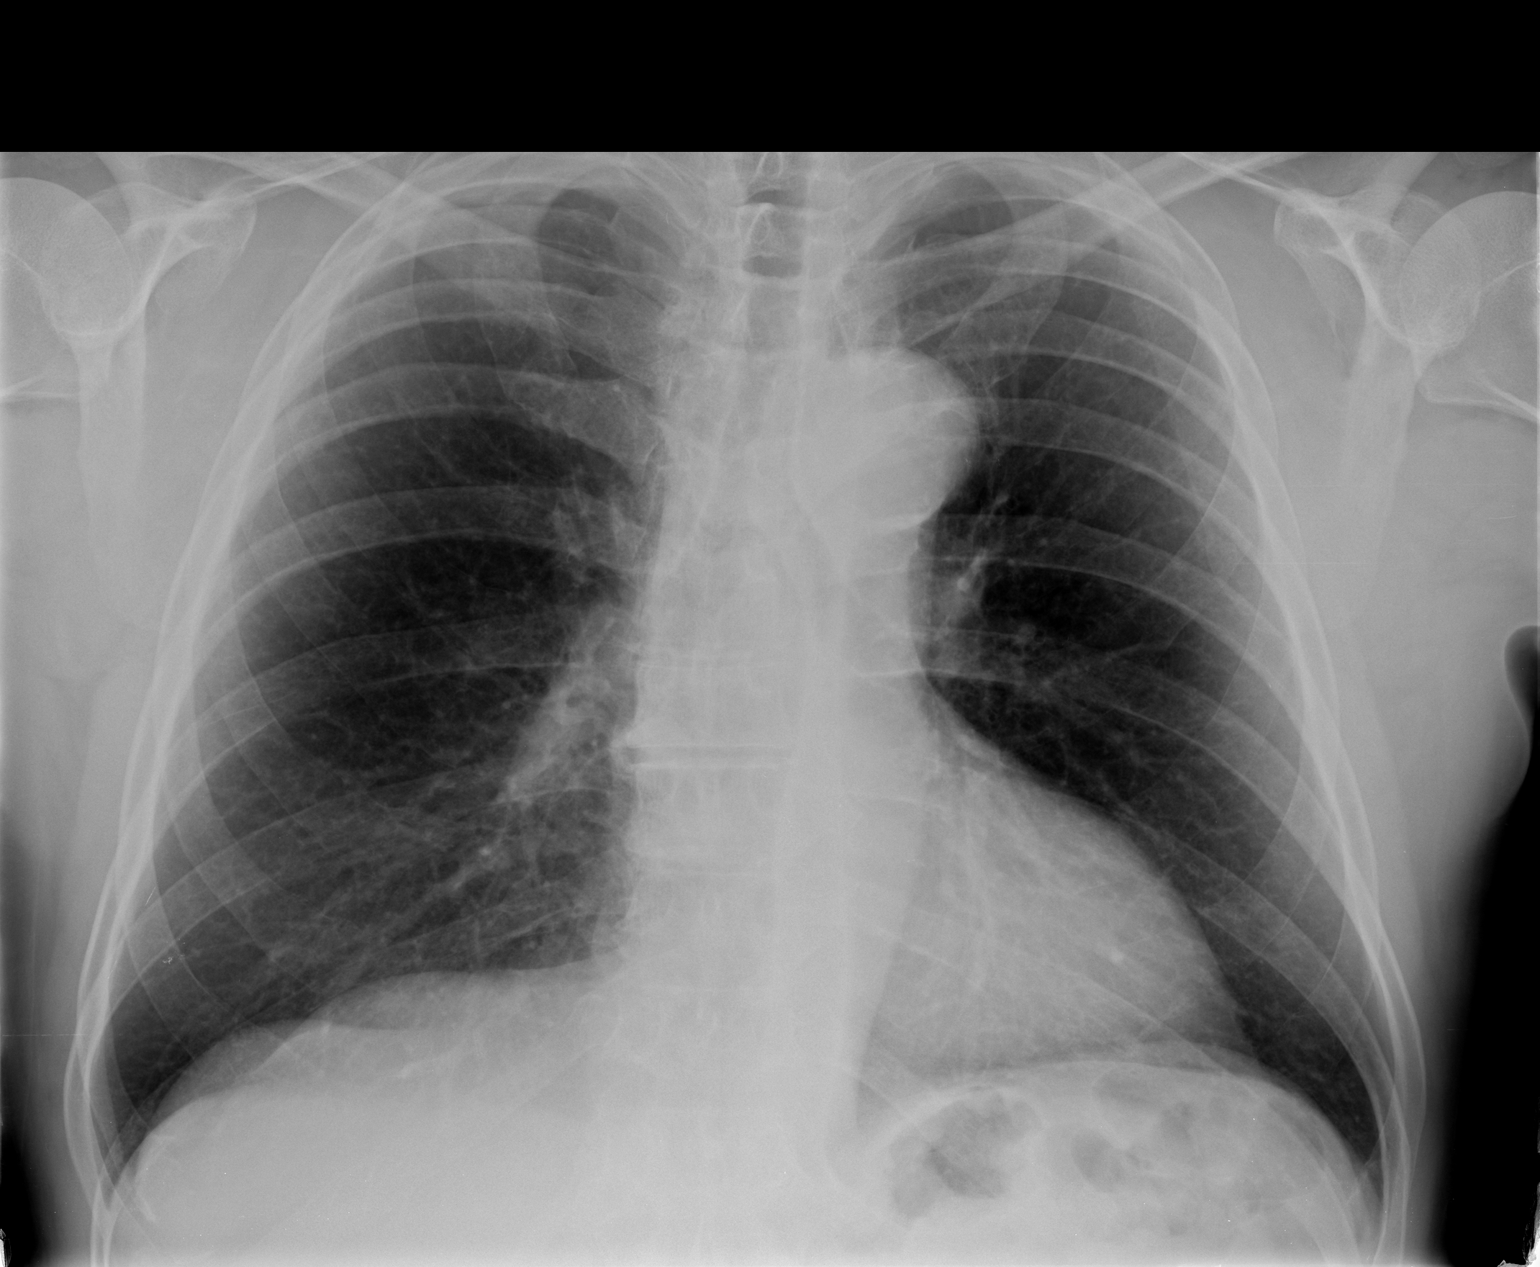

[view not recorded (2 of 2)]
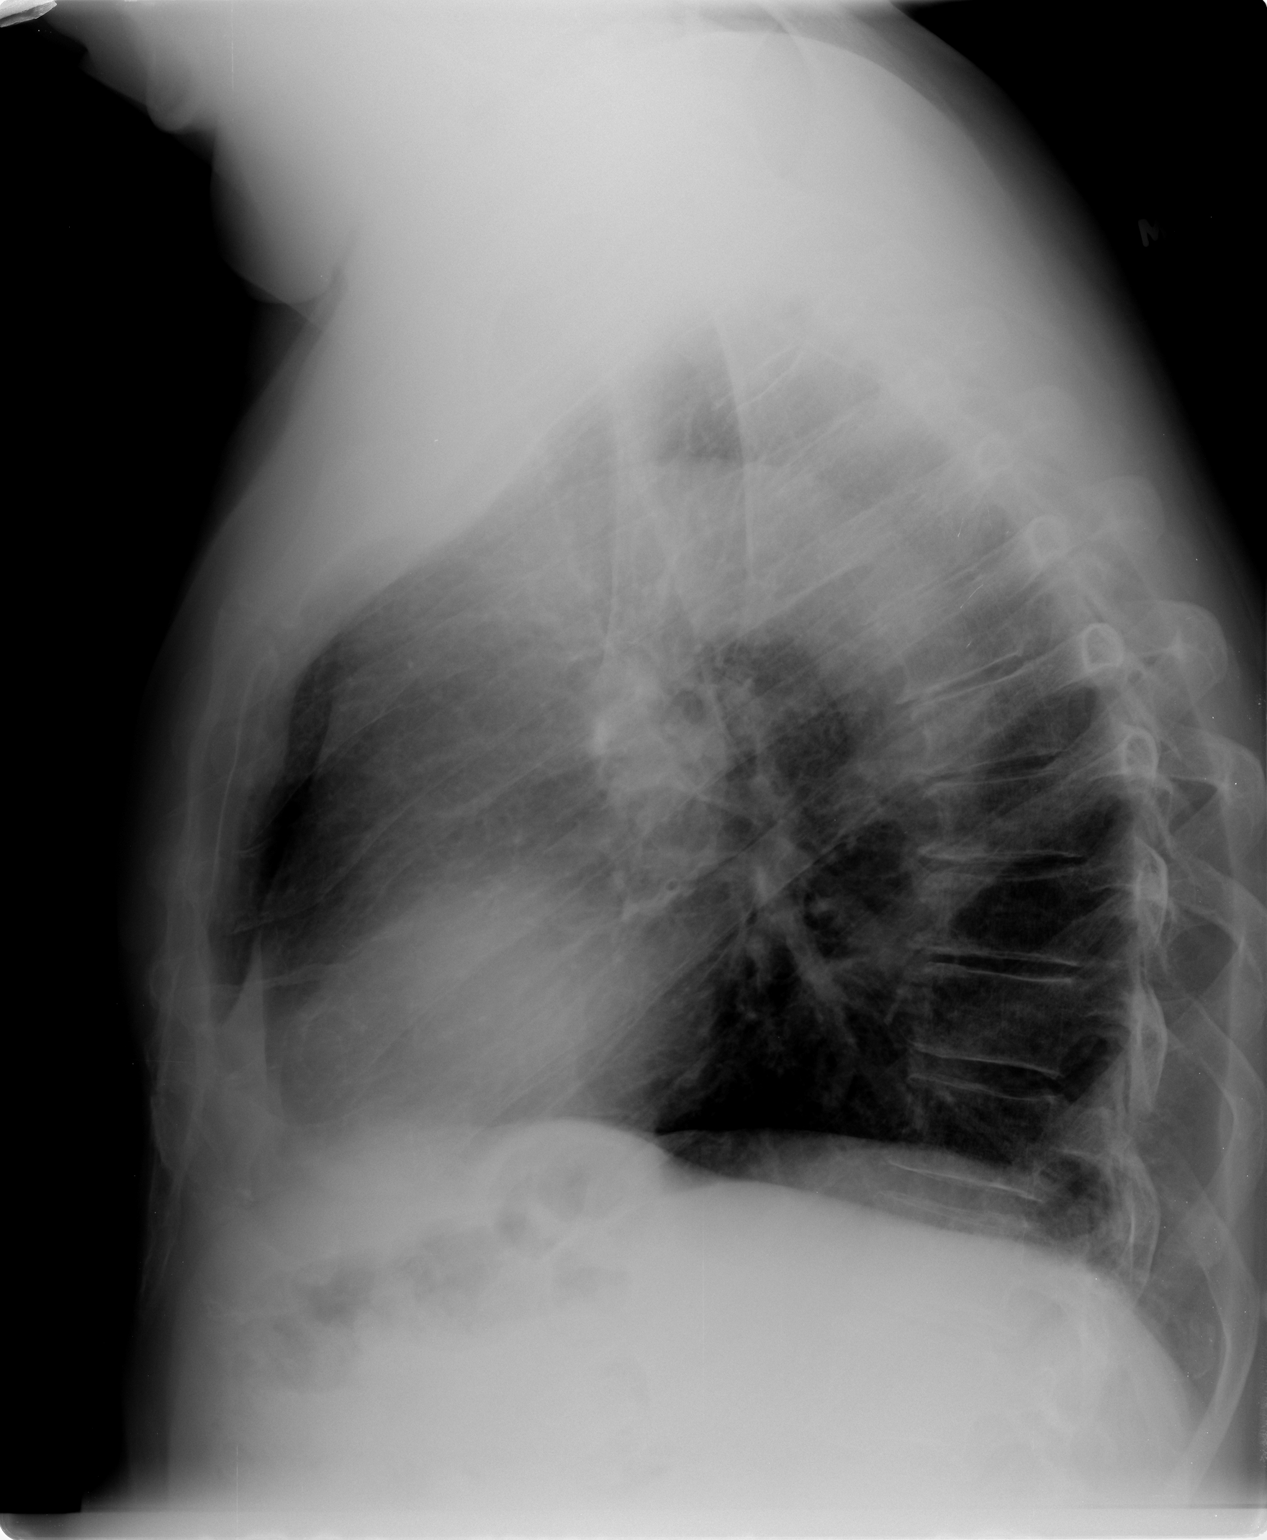

[2 of 2 positions shown; findings below may reference images not displayed]

FINDINGS: Increased AP diameter.  Interstitial coarsening.
Prominent aortic knob contour.  No focal airspace opacity.  No
pleural effusion or pneumothorax.  Mild multilevel degenerative
changes.
IMPRESSION: Increased AP diameter and interstitial coarsening may reflect
underlying COPD.

Prominent aortic knob may be secondary to aortic dilatation or
tortuosity.    This can be further characterized with CTA chest or
echocardiogram.

## 2014-04-13 DIAGNOSIS — Z8673 Personal history of transient ischemic attack (TIA), and cerebral infarction without residual deficits: Secondary | ICD-10-CM | POA: Diagnosis not present

## 2014-05-09 DIAGNOSIS — Z8673 Personal history of transient ischemic attack (TIA), and cerebral infarction without residual deficits: Secondary | ICD-10-CM | POA: Diagnosis not present

## 2014-06-06 DIAGNOSIS — Z8673 Personal history of transient ischemic attack (TIA), and cerebral infarction without residual deficits: Secondary | ICD-10-CM | POA: Diagnosis not present

## 2014-07-06 DIAGNOSIS — I6990 Unspecified sequelae of unspecified cerebrovascular disease: Secondary | ICD-10-CM | POA: Diagnosis not present

## 2014-09-29 ENCOUNTER — Other Ambulatory Visit: Payer: Self-pay | Admitting: Family Medicine

## 2014-09-29 ENCOUNTER — Ambulatory Visit: Payer: Self-pay

## 2014-09-30 LAB — PROTIME-INR
INR: 2.2 — AB (ref 0.8–1.2)
Prothrombin Time: 23.2 s — ABNORMAL HIGH (ref 9.1–12.0)

## 2014-10-03 ENCOUNTER — Telehealth: Payer: Self-pay | Admitting: Family Medicine

## 2014-10-03 NOTE — Telephone Encounter (Signed)
I took call from [place] to make PT/INR appt for 4 weeks from today. Pt's wife [name] stated pt would be out of town 7/1-7/8 and Fridays are better for her so, I scheduled pt for the PT/ INR on 11/10/14 @ 11 am. Does pt need to come in the last week of June before going out of town or is this appointment ok? Thanks TNP

## 2014-11-08 ENCOUNTER — Ambulatory Visit (INDEPENDENT_AMBULATORY_CARE_PROVIDER_SITE_OTHER): Payer: 59

## 2014-11-08 DIAGNOSIS — I82409 Acute embolism and thrombosis of unspecified deep veins of unspecified lower extremity: Secondary | ICD-10-CM | POA: Diagnosis not present

## 2014-11-08 LAB — POCT INR
INR: 2.3
PT: 28

## 2014-11-08 NOTE — Patient Instructions (Signed)
Continue same dose. Coumadin 5 mg on Mondays and Fridays, and 10 mg the other days. Call if any questions or concerns. Come back in 4 weeks for a PT/INR check. Come back in 4 weeks for  A PT

## 2014-11-10 ENCOUNTER — Ambulatory Visit: Payer: Self-pay

## 2014-11-20 ENCOUNTER — Other Ambulatory Visit: Payer: Self-pay | Admitting: Family Medicine

## 2014-11-20 NOTE — Telephone Encounter (Signed)
Pt's wife contacted office for refill request on the following medications: Coumadin 5 mg to [facility]. [name] stated they requested the refill from mail order but the status is showing processing and she would like a 3 or 10 day supply sent to [facility] to hold him until the mail order comes in. [name] requested it be done today because pt is out. I advised refill request can take 24 to 48 hours. [name] request that a nurse call her back today to let her know if it would be done today. Thanks TNP

## 2014-11-21 MED ORDER — WARFARIN SODIUM 5 MG PO TABS: 5.0000 mg | ORAL_TABLET | Freq: Every day | ORAL | Status: DC

## 2014-12-02 LAB — LIPID PANEL: HDL: 55 mg/dL (ref 35–70)

## 2014-12-06 ENCOUNTER — Ambulatory Visit: Payer: Medicare Other

## 2014-12-08 ENCOUNTER — Ambulatory Visit (INDEPENDENT_AMBULATORY_CARE_PROVIDER_SITE_OTHER): Payer: 59

## 2014-12-08 DIAGNOSIS — I82409 Acute embolism and thrombosis of unspecified deep veins of unspecified lower extremity: Secondary | ICD-10-CM

## 2014-12-08 LAB — POCT INR
INR: 1.8
PT: 21.9

## 2014-12-08 NOTE — Patient Instructions (Signed)
Continue same dose coumadin 10 mg daily except 5 mg M and F. Come back in 1 week. Call if any questions or concerns.

## 2014-12-15 ENCOUNTER — Ambulatory Visit (INDEPENDENT_AMBULATORY_CARE_PROVIDER_SITE_OTHER): Payer: 59

## 2014-12-15 DIAGNOSIS — I82409 Acute embolism and thrombosis of unspecified deep veins of unspecified lower extremity: Secondary | ICD-10-CM

## 2014-12-15 LAB — POCT INR
INR: 2.6
PT: 31.1

## 2014-12-15 NOTE — Patient Instructions (Signed)
Continue same dose, coumadin 5 mg M and F and 10 mg the other days. Come back in 4 weeks. Call if any questions or concerns.

## 2015-01-12 ENCOUNTER — Ambulatory Visit: Payer: 59

## 2015-01-19 ENCOUNTER — Ambulatory Visit (INDEPENDENT_AMBULATORY_CARE_PROVIDER_SITE_OTHER): Payer: 59

## 2015-01-19 DIAGNOSIS — I6990 Unspecified sequelae of unspecified cerebrovascular disease: Secondary | ICD-10-CM

## 2015-01-19 LAB — POCT INR
INR: 2.2
PT: 25.8

## 2015-01-19 NOTE — Patient Instructions (Signed)
Continue same dose, coumadin 5 mg M and F and 10 mg the other days. Come back in 4 weeks. Call if any questions or concerns.   

## 2015-02-16 ENCOUNTER — Ambulatory Visit (INDEPENDENT_AMBULATORY_CARE_PROVIDER_SITE_OTHER): Payer: 59

## 2015-02-16 DIAGNOSIS — I82409 Acute embolism and thrombosis of unspecified deep veins of unspecified lower extremity: Secondary | ICD-10-CM | POA: Diagnosis not present

## 2015-02-16 LAB — POCT INR
INR: 1.6
PT: 19.2

## 2015-02-16 NOTE — Patient Instructions (Signed)
Anticoagulation Dose Instructions as of 02/16/2015      Dorene Grebe Tue Wed Thu Fri Sat   New Dose 10 mg 5 mg 10 mg 10 mg 10 mg 5 mg 10 mg    Description        Continue same dose, coumadin 5 mg M and F and 10 mg the other days. Come back in 3 weeks. Call if any questions or concerns.

## 2015-03-09 ENCOUNTER — Ambulatory Visit (INDEPENDENT_AMBULATORY_CARE_PROVIDER_SITE_OTHER): Payer: 59

## 2015-03-09 DIAGNOSIS — I82409 Acute embolism and thrombosis of unspecified deep veins of unspecified lower extremity: Secondary | ICD-10-CM | POA: Diagnosis not present

## 2015-03-09 LAB — POCT INR: INR: 2.4

## 2015-04-02 NOTE — Progress Notes (Signed)
Patient ID: Chase [name], male   DOB: 10/18/1950, 64 y.o.   MRN: [id number] This patient was not seen he only had a PT/INR done.

## 2015-04-05 DIAGNOSIS — M7710 Lateral epicondylitis, unspecified elbow: Secondary | ICD-10-CM | POA: Insufficient documentation

## 2015-04-05 DIAGNOSIS — R413 Other amnesia: Secondary | ICD-10-CM | POA: Insufficient documentation

## 2015-04-05 DIAGNOSIS — M2550 Pain in unspecified joint: Secondary | ICD-10-CM | POA: Insufficient documentation

## 2015-04-06 ENCOUNTER — Ambulatory Visit (INDEPENDENT_AMBULATORY_CARE_PROVIDER_SITE_OTHER): Payer: 59 | Admitting: Family Medicine

## 2015-04-06 ENCOUNTER — Encounter: Payer: Self-pay | Admitting: Family Medicine

## 2015-04-06 VITALS — BP 138/76 | HR 78 | Temp 98.0°F | Resp 16 | Ht 70.5 in | Wt 212.0 lb

## 2015-04-06 DIAGNOSIS — I6990 Unspecified sequelae of unspecified cerebrovascular disease: Secondary | ICD-10-CM | POA: Diagnosis not present

## 2015-04-06 DIAGNOSIS — I771 Stricture of artery: Secondary | ICD-10-CM | POA: Diagnosis not present

## 2015-04-06 DIAGNOSIS — Z720 Tobacco use: Secondary | ICD-10-CM | POA: Diagnosis not present

## 2015-04-06 DIAGNOSIS — Z23 Encounter for immunization: Secondary | ICD-10-CM

## 2015-04-06 DIAGNOSIS — I824Z3 Acute embolism and thrombosis of unspecified deep veins of distal lower extremity, bilateral: Secondary | ICD-10-CM

## 2015-04-06 DIAGNOSIS — F321 Major depressive disorder, single episode, moderate: Secondary | ICD-10-CM

## 2015-04-06 DIAGNOSIS — I159 Secondary hypertension, unspecified: Secondary | ICD-10-CM | POA: Diagnosis not present

## 2015-04-06 DIAGNOSIS — I82409 Acute embolism and thrombosis of unspecified deep veins of unspecified lower extremity: Secondary | ICD-10-CM

## 2015-04-06 LAB — POCT INR
INR: 2.1
PT: 25.4

## 2015-04-06 NOTE — Progress Notes (Signed)
[name] Male    DOB: 04-11-1951   64 y.o.   MRN: [id number] Visit Date: 04/06/2015  Today's Provider: Lelon Huh, MD   Chief Complaint  Patient presents with  . Hypertension    follow up  . DVT   Subjective:    HPI   Hypertension, follow-up:  BP Readings from Last 3 Encounters:  12/01/13 132/80  07/01/12 147/79  06/29/12 132/87    He was last seen for hypertension 1 years ago.  BP at that visit was  132/80. Management since that visit includes no changes. He reports good compliance with treatment. He is not having side effects.  He is exercising  (yardwork) He is adherent to low salt diet.   Outside blood pressures are not being checked. He is experiencing none.  Patient denies chest pain, chest pressure/discomfort, claudication, dyspnea, exertional chest pressure/discomfort, fatigue, irregular heart beat, lower extremity edema, near-syncope, orthopnea, palpitations and paroxysmal nocturnal dyspnea.   Cardiovascular risk factors include advanced age (older than 62 for men, 25 for women), hypertension and male gender.  Use of agents associated with hypertension:none    Weight trend: stable Wt Readings from Last 3 Encounters:  12/01/13 217 lb (98.431 kg)  06/29/12 215 lb (97.523 kg)  06/29/12 215 lb (97.523 kg)    Current diet: in general, a "healthy" diet    ------------------------------------------------------------------------  Follow up Coumadin Management: Last PT/INR was checked 1 month ago and no changes were made. Patient reports good compliance with treatment and good tolerance. Is on warfarin for remote chronic DVT and subsequent embolic CVA  Memory impairment.  Has has significant short term memory impairment since CVA. Condition is unchanged. He frequently forgets what tasks he is supposed to be doing. Is often unable to remember whether he has taken his daily medications. He has developed techniques to help him keep track of  his medications which is usually effective. His wife is helpful as well.   Smoking: Has cut down to 1 pps and will continue to try to cut back more.    Anxiety: Has been doing well on current dose of venlafaxine for several yearly. Is tolerating well without adverse effects.     No Known Allergies Previous Medications   ACETAMINOPHEN (TYLENOL) 325 MG TABLET    Take 2 tablets (650 mg total) by mouth every 6 (six) hours as needed for pain.   AMLODIPINE-BENAZEPRIL (LOTREL) 10-20 MG PER CAPSULE    Take 1 capsule by mouth daily.   FLUTICASONE (FLONASE) 50 MCG/ACT NASAL SPRAY    Place 2 sprays into the nose daily.   VENLAFAXINE XR (EFFEXOR-XR) 150 MG 24 HR CAPSULE    Take 150 mg by mouth daily.   WARFARIN (COUMADIN) 5 MG TABLET    Take 1 tablet (5 mg total) by mouth daily. Patient takes 10 mg daily, except Mon's and Fri's-takes 5 mg.    Review of Systems  Constitutional: Negative for fever, chills and appetite change.  Respiratory: Negative for chest tightness, shortness of breath and wheezing.   Cardiovascular: Negative for chest pain and palpitations.  Gastrointestinal: Negative for nausea, vomiting and abdominal pain.    Social History  Substance Use Topics  . Smoking status: Heavy Tobacco Smoker -- 1.00 packs/day for 49 years    Types: Cigarettes  . Smokeless tobacco: Not on file  . Alcohol Use: 25.2 oz/week    42 Cans of beer per week     Comment: 4-6 beers a day  Objective:   BP 138/76 mmHg  Pulse 78  Temp(Src) 98 F (36.7 C) (Oral)  Resp 16  Ht 5' 10.5" (1.791 m)  Wt 212 lb (96.163 kg)  BMI 29.98 kg/m2  Physical Exam   General Appearance:    Alert, cooperative, no distress  Eyes:    PERRL, conjunctiva/corneas clear, EOM's intact       Lungs:     Clear to auscultation bilaterally, respirations unlabored  Heart:    Regular rate and rhythm  Neurologic:   Awake, alert, oriented x 3. No apparent focal neurological           defect.           Assessment & Plan:      1. Hypertension, unspecified Well controlled.  Continue current medications.   - Renal function panel  2. Cerebrovascular accident, late effects   3. Deep vein thrombosis (DVT) of distal vein of both lower extremities, unspecified chronicity (HCC) Continue current warfarin regiment  4. Depression - Continue current dose of venlafaxine  5. Tobacco abuse Stop smoking.        Lelon Huh, MD  [facility]

## 2015-04-06 NOTE — Addendum Note (Signed)
Addended by: Meyer Cory L on: 04/06/2015 11:53 AM   Modules accepted: Orders

## 2015-04-06 NOTE — Patient Instructions (Signed)
Stop smoking   Schedule PT/INR check in 1 month.

## 2015-04-07 LAB — RENAL FUNCTION PANEL
ALBUMIN: 4.2 g/dL (ref 3.6–4.8)
BUN / CREAT RATIO: 12 (ref 10–22)
BUN: 10 mg/dL (ref 8–27)
CHLORIDE: 105 mmol/L (ref 97–106)
CO2: 23 mmol/L (ref 18–29)
CREATININE: 0.85 mg/dL (ref 0.76–1.27)
Calcium: 8.8 mg/dL (ref 8.6–10.2)
GFR calc non Af Amer: 92 mL/min/{1.73_m2} (ref >59)
GFR, EST AFRICAN AMERICAN: 106 mL/min/{1.73_m2} (ref >59)
Glucose: 89 mg/dL (ref 65–99)
Phosphorus: 3 mg/dL (ref 2.5–4.5)
Potassium: 4 mmol/L (ref 3.5–5.2)
Sodium: 143 mmol/L (ref 136–144)

## 2015-04-09 ENCOUNTER — Encounter: Payer: Self-pay | Admitting: Family Medicine

## 2015-05-04 ENCOUNTER — Other Ambulatory Visit: Payer: Self-pay | Admitting: Family Medicine

## 2015-05-04 ENCOUNTER — Ambulatory Visit (INDEPENDENT_AMBULATORY_CARE_PROVIDER_SITE_OTHER): Payer: 59

## 2015-05-04 DIAGNOSIS — I82409 Acute embolism and thrombosis of unspecified deep veins of unspecified lower extremity: Secondary | ICD-10-CM | POA: Diagnosis not present

## 2015-05-04 LAB — POCT INR
INR: 1.9
PT: 22.2

## 2015-05-04 NOTE — Progress Notes (Signed)
Anticoagulation Dose Instructions as of 05/04/2015      Chase [name] Tue Wed Thu Fri Sat   New Dose 10 mg 5 mg 10 mg 10 mg 10 mg 5 mg 10 mg    Description        Patient currently on abx. Continue same dose, coumadin 5 mg M and F and 10 mg the other days. Come back in 4 weeks. Call if any questions or concerns.

## 2015-05-04 NOTE — Patient Instructions (Signed)
Continue current dose and recheck in 4 weeks

## 2015-05-07 ENCOUNTER — Encounter: Payer: 59 | Admitting: Family Medicine

## 2015-05-14 ENCOUNTER — Encounter: Payer: Self-pay | Admitting: Family Medicine

## 2015-05-14 ENCOUNTER — Ambulatory Visit (INDEPENDENT_AMBULATORY_CARE_PROVIDER_SITE_OTHER): Payer: 59 | Admitting: Family Medicine

## 2015-05-14 VITALS — BP 130/72 | HR 72 | Temp 98.2°F | Resp 16 | Ht 70.5 in | Wt 222.6 lb

## 2015-05-14 DIAGNOSIS — G4733 Obstructive sleep apnea (adult) (pediatric): Secondary | ICD-10-CM | POA: Diagnosis not present

## 2015-05-14 DIAGNOSIS — F321 Major depressive disorder, single episode, moderate: Secondary | ICD-10-CM

## 2015-05-14 DIAGNOSIS — F17200 Nicotine dependence, unspecified, uncomplicated: Secondary | ICD-10-CM | POA: Diagnosis not present

## 2015-05-14 DIAGNOSIS — I10 Essential (primary) hypertension: Secondary | ICD-10-CM

## 2015-05-14 DIAGNOSIS — I6990 Unspecified sequelae of unspecified cerebrovascular disease: Secondary | ICD-10-CM | POA: Diagnosis not present

## 2015-05-14 DIAGNOSIS — Z0000 Encounter for general adult medical examination without abnormal findings: Secondary | ICD-10-CM

## 2015-05-14 LAB — IFOBT (OCCULT BLOOD): IMMUNOLOGICAL FECAL OCCULT BLOOD TEST: NEGATIVE

## 2015-05-14 NOTE — Patient Instructions (Signed)
Please stop smoking 

## 2015-05-14 NOTE — Progress Notes (Signed)
Patient ID: Chase [name], male   DOB: 01-Aug-1950, 65 y.o.   MRN: [id number]       [name], Male    DOB: Sep 19, 1950, 65 y.o.   MRN: [id number] Visit Date: 05/14/2015  Today's Provider: Lelon Huh, MD   Chief Complaint  Patient presents with  . Annual Exam  . Hypertension  . Cerebrovascular Accident   Subjective:    Annual physical exam Chase [name] is a 65 y.o. male who presents today for health maintenance and complete physical. He feels well. He reports exercising none, but works outside. He reports he is sleeping well (average 8 hours per night).  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 Remains on lifetime anticoagulation. Memory is stable. He states he thinks he went out of town for the holidays, but can't remember for sure.    Hypertension, follow-up:  BP Readings from Last 3 Encounters:  04/06/15 138/76  12/01/13 132/80  07/01/12 147/79    He was last seen for hypertension 1 months ago.  BP at that visit was 138/76. Management changes since that visit include no changes. He reports excellent compliance with treatment. He is not having side effects.  He is not exercising. He is adherent to low salt diet.   Outside blood pressures are (pt is not checking blood pressure at home). He is experiencing none.  Patient denies none.   Cardiovascular risk factors include hypertension and smoking/ tobacco exposure.  Use of agents associated with hypertension: none.     Weight trend: stable Wt Readings from Last 3 Encounters:  04/06/15 212 lb (96.163 kg)  12/01/13 217 lb (98.431 kg)  06/29/12 215 lb (97.523 kg)    Current diet: in general, a "healthy" diet    ------------------------------------------------------------------------     Review of Systems  Constitutional: Negative.   HENT: Negative.   Eyes: Negative.   Respiratory:  Negative.   Cardiovascular: Negative.   Gastrointestinal: Negative.   Endocrine: Negative.   Genitourinary: Negative.   Musculoskeletal: Negative.   Skin: Negative.   Allergic/Immunologic: Negative.   Neurological: Negative for tremors, syncope, facial asymmetry, speech difficulty, weakness, numbness and headaches.       Very forgetful  Hematological: Negative.   Psychiatric/Behavioral: Negative.     Social History      He  reports that he has been smoking Cigarettes.  He has a 49 pack-year smoking history. He does not have any smokeless tobacco history on file. He reports that he drinks about 25.2 oz of alcohol per week. He reports that he does not use illicit drugs.       Social History   Social History  . Marital Status: Married    Spouse Name: N/A  . Number of Children: N/A  . Years of Education: 68   Social History Main Topics  . Smoking status: Heavy Tobacco Smoker -- 1.00 packs/day for 49 years    Types: Cigarettes  . Smokeless tobacco: Not on file  . Alcohol Use: 25.2 oz/week    42 Cans of beer per week     Comment: 4-6 beers a day  . Drug Use: No  . Sexual Activity: Not on file   Other Topics Concern  . Not on file   Social History Narrative    Past Medical History  Diagnosis Date  . History of ischemic vertebrobasilar artery thalamic stroke 2001  . Patent foramen ovale   . DVT (deep venous thrombosis) ([place])   .  Hypertension   . Hearing deficit   . Right knee DJD   . Stroke ([place]) 2001  . History of stroke   . Hyperlipidemia   . Clotting disorder [facility])      Patient Active Problem List   Diagnosis Date Noted  . Ache in joint 04/05/2015  . BP (high blood pressure) 04/05/2015  . Memory impairment 04/05/2015  . S/P right total knee replacement 06/29/2012  . Tortuous aorta ([place]) 01/20/2012  . History of ischemic vertebrobasilar artery thalamic stroke   . Patent foramen ovale   . DVT (deep venous thrombosis) ([place])   . Hypertension   . Hearing  deficit   . Right knee DJD   . History of CVA (cerebrovascular accident) 10/09/2011  . Eustachian tube dysfunction 11/15/2008  . Cerebrovascular accident, late effects 08/29/2008  . Ostium secundum type atrial septal defect 05/06/2000  . Sleep apnea, obstructive 01/23/2000  . Major depressive disorder, single episode 03/21/1998  . Compulsive tobacco user syndrome 03/21/1998    Past Surgical History  Procedure Laterality Date  . Knee arthroscopy      right knee  . Fracture surgery      R leg  . Appendectomy    . Total knee arthroplasty  01/26/2012    Procedure: TOTAL KNEE ARTHROPLASTY;  Surgeon: Lorn Junes, MD;  Location: [place];  Service: Orthopedics;  Laterality: Right;  . Incision and drainage Right 06/30/2012    Procedure: INCISION AND DRAINAGE;  Surgeon: Lorn Junes, MD;  Location: [place];  Service: Orthopedics;  Laterality: Right;  . Knee bursectomy Right 06/30/2012    Procedure: KNEE BURSECTOMY;  Surgeon: Lorn Junes, MD;  Location: [place];  Service: Orthopedics;  Laterality: Right;  Pre-patellar with wound closure    Family History        Family Status  Relation Status Death Age  . Father Deceased 67  . Brother Alive   . Mother Deceased   . Brother Alive   . Brother Alive     half brother   . Sister Alive         His family history includes Cancer in his father; Seizures in his brother.    No Known Allergies  Previous Medications   ACETAMINOPHEN (TYLENOL) 325 MG TABLET    Take 2 tablets (650 mg total) by mouth every 6 (six) hours as needed for pain.   AMLODIPINE-BENAZEPRIL (LOTREL) 10-20 MG PER CAPSULE    Take 1 capsule by mouth daily.   FLUTICASONE (FLONASE) 50 MCG/ACT NASAL SPRAY    Place 2 sprays into the nose daily.   VENLAFAXINE HCL 150 MG TB24    Take 1 tablet by mouth  daily   WARFARIN (COUMADIN) 5 MG TABLET    Take 1 tablet (5 mg total) by mouth daily. Patient takes 10 mg daily, except Mon's and Fri's-takes 5 mg.    Patient Care Team: Birdie Sons, MD as PCP - General (Family Medicine) Birdie Sons, MD as Referring Physician (Family Medicine)     Objective:   Vitals: There were no vitals taken for this visit.   Physical Exam   General Appearance:    Alert, cooperative, no distress, appears stated age, oveweight  Head:    Normocephalic, without obvious abnormality, atraumatic  Eyes:    PERRL, conjunctiva/corneas clear, EOM's intact, fundi    benign, both eyes       Ears:    Normal TM's and external ear canals, both ears  Nose:  Nares normal, septum midline, mucosa normal, no drainage   or sinus tenderness  Throat:   Lips, mucosa, and tongue normal; teeth and gums normal  Neck:   Supple, symmetrical, trachea midline, no adenopathy;       thyroid:  No enlargement/tenderness/nodules; no carotid   bruit or JVD  Back:     Symmetric, no curvature, ROM normal, no CVA tenderness  Lungs:     Clear to auscultation bilaterally, respirations unlabored  Chest wall:    No tenderness or deformity  Heart:    Regular rate and rhythm, S1 and S2 normal, no murmur, rub   or gallop  Abdomen:     Soft, non-tender, bowel sounds active all four quadrants,    no masses, no organomegaly  Genitalia:    deferred  Rectal:    normal tone, normal prostate, no masses or tenderness  Extremities:   Extremities normal, atraumatic, no cyanosis or edema  Pulses:   2+ and symmetric all extremities  Skin:   Skin color, texture, turgor normal, no rashes or lesions  Lymph nodes:   Cervical, supraclavicular, and axillary nodes normal  Neurologic:   CNII-XII intact. Normal strength, sensation and reflexes      throughout    Depression Screen No flowsheet data found.    Assessment & Plan:     Routine Health Maintenance and Physical Exam  Exercise Activities and Dietary recommendations Goals    None      Immunization History  Administered Date(s) Administered  . Influenza Split 01/27/2012  . Influenza,inj,Quad PF,36+ Mos 04/06/2015  .  Pneumococcal Polysaccharide-23 01/27/2012  . Tdap 11/10/2005    Health Maintenance  Topic Date Due  . HIV Screening  10/04/1965  . COLONOSCOPY  10/04/2000  . ZOSTAVAX  10/05/2010  . TETANUS/TDAP  11/11/2015  . INFLUENZA VACCINE  11/27/2015  . Hepatitis C Screening  Completed      Discussed health benefits of physical activity, and encouraged him to engage in regular exercise appropriate for his age and condition.    --------------------------------------------------------------------  1. Routine adult health maintenance  - EKG 12-Lead - IFOBT POC (occult bld, rslt in office)  2. Essential hypertension Well controlled.  Continue current medications.    3. Major depressive disorder, single episode, moderate (HCC) Doing well on Effexor  4. Counseled on importance of smoking cessation.   5. Short term memory deficits secondary to CVA. Unchanged remains unable to work.

## 2015-06-06 ENCOUNTER — Ambulatory Visit (INDEPENDENT_AMBULATORY_CARE_PROVIDER_SITE_OTHER): Payer: 59

## 2015-06-06 DIAGNOSIS — I82409 Acute embolism and thrombosis of unspecified deep veins of unspecified lower extremity: Secondary | ICD-10-CM

## 2015-06-06 LAB — POCT INR
INR: 3
PT: 35.9

## 2015-06-06 NOTE — Patient Instructions (Signed)
Anticoagulation Dose Instructions as of 06/06/2015      Dorene Grebe Tue Wed Thu Fri Sat   New Dose 10 mg 5 mg 10 mg 10 mg 10 mg 5 mg 10 mg    Description        Continue same dose, coumadin 5 mg M and F and 10 mg the other days. Come back in 4 weeks. Call if any questions or concerns.

## 2015-06-15 ENCOUNTER — Ambulatory Visit: Payer: 59

## 2015-06-25 ENCOUNTER — Ambulatory Visit
Admission: RE | Admit: 2015-06-25 | Discharge: 2015-06-25 | Disposition: A | Discharge status: Home / Self Care | Payer: 59 | Source: Ambulatory Visit | Attending: Family Medicine | Admitting: Family Medicine

## 2015-06-25 ENCOUNTER — Encounter: Payer: Self-pay | Admitting: Family Medicine

## 2015-06-25 ENCOUNTER — Ambulatory Visit (INDEPENDENT_AMBULATORY_CARE_PROVIDER_SITE_OTHER): Payer: 59 | Admitting: Family Medicine

## 2015-06-25 VITALS — BP 140/80 | HR 70 | Temp 98.3°F | Resp 16 | Ht 70.5 in | Wt 216.0 lb

## 2015-06-25 DIAGNOSIS — R059 Cough, unspecified: Secondary | ICD-10-CM

## 2015-06-25 DIAGNOSIS — J40 Bronchitis, not specified as acute or chronic: Secondary | ICD-10-CM

## 2015-06-25 DIAGNOSIS — R05 Cough: Secondary | ICD-10-CM | POA: Insufficient documentation

## 2015-06-25 DIAGNOSIS — R918 Other nonspecific abnormal finding of lung field: Secondary | ICD-10-CM | POA: Insufficient documentation

## 2015-06-25 MED ORDER — AZITHROMYCIN 250 MG PO TABS: ORAL_TABLET | ORAL | Status: AC

## 2015-06-25 MED ORDER — MONTELUKAST SODIUM 10 MG PO TABS: 10.0000 mg | ORAL_TABLET | Freq: Every day | ORAL | Status: DC

## 2015-06-25 NOTE — Progress Notes (Signed)
[name] Male    DOB: 1951/02/24   65 y.o.   MRN: [id number] Visit Date: 06/25/2015  Today's Provider: Lelon Huh, MD   Chief Complaint  Patient presents with  . Cough   Subjective:    Cough This is a new problem. The current episode started more than 1 month ago. The problem has been unchanged. The problem occurs hourly. The cough is non-productive. Associated symptoms include chest pain, nasal congestion, postnasal drip, shortness of breath and sweats. Pertinent negatives include no chills, ear congestion, ear pain, fever, headaches, heartburn, hemoptysis, myalgias, rash, rhinorrhea, sore throat or wheezing. The symptoms are aggravated by exercise (heat). Treatments tried: mucinex. The treatment provided mild relief. There is no history of asthma, bronchitis or pneumonia.    Cough and nasal congestion for the last month. Coughs so hard there is pain in right breast area. Cough is non-productive. Associated with runny nose and usually clear drainage.   No Known Allergies Previous Medications   ACETAMINOPHEN (TYLENOL) 325 MG TABLET    Take 2 tablets (650 mg total) by mouth every 6 (six) hours as needed for pain.   AMLODIPINE-BENAZEPRIL (LOTREL) 10-20 MG PER CAPSULE    Take 1 capsule by mouth daily.   FLUTICASONE (FLONASE) 50 MCG/ACT NASAL SPRAY    Place 2 sprays into the nose daily.   VENLAFAXINE HCL 150 MG TB24    Take 1 tablet by mouth  daily   WARFARIN (COUMADIN) 5 MG TABLET    Take 1 tablet (5 mg total) by mouth daily. Patient takes 10 mg daily, except Mon's and Fri's-takes 5 mg.    Review of Systems  Constitutional: Positive for fatigue. Negative for fever, chills and appetite change.  HENT: Positive for postnasal drip. Negative for ear pain, rhinorrhea and sore throat.   Respiratory: Positive for cough and shortness of breath. Negative for hemoptysis, chest tightness and wheezing.   Cardiovascular: Positive for chest pain. Negative for palpitations.    Gastrointestinal: Negative for heartburn, nausea, vomiting and abdominal pain.  Musculoskeletal: Negative for myalgias.  Skin: Negative for rash.  Neurological: Negative for headaches.    Social History  Substance Use Topics  . Smoking status: Heavy Tobacco Smoker -- 1.00 packs/day for 49 years    Types: Cigarettes  . Smokeless tobacco: Not on file  . Alcohol Use: 25.2 oz/week    42 Cans of beer per week     Comment: 4-6 beers a day   Objective:   BP 140/80 mmHg  Pulse 70  Temp(Src) 98.3 F (36.8 C) (Oral)  Resp 16  Ht 5' 10.5" (1.791 m)  Wt 216 lb (97.977 kg)  BMI 30.54 kg/m2  Physical Exam   General Appearance:    Alert, cooperative, no distress  Eyes:    PERRL, conjunctiva/corneas clear, EOM's intact       Lungs:    Diffuse expiratory wheezes, no focal rales or rhonchi, respirations unlabored  Heart:    Regular rate and rhythm  Neurologic:   Awake, alert, oriented x 3. No apparent focal neurological           defect.            Assessment & Plan:     1. Cough  - DG Chest 2 View; Future - montelukast (SINGULAIR) 10 MG tablet; Take 1 tablet (10 mg total) by mouth at bedtime.  Dispense: 30 tablet; Refill: 3  2. Bronchitis  - azithromycin (ZITHROMAX) 250 MG tablet; 2  by mouth today, then 1 daily for 4 days  Dispense: 6 tablet; Refill: 0       Lelon Huh, MD  [facility]

## 2015-06-25 NOTE — Patient Instructions (Signed)
Go to the [facility] on [address] for Chest Xray  

## 2015-07-04 ENCOUNTER — Ambulatory Visit: Payer: 59

## 2015-07-11 ENCOUNTER — Ambulatory Visit (INDEPENDENT_AMBULATORY_CARE_PROVIDER_SITE_OTHER): Payer: 59

## 2015-07-11 DIAGNOSIS — I82409 Acute embolism and thrombosis of unspecified deep veins of unspecified lower extremity: Secondary | ICD-10-CM | POA: Diagnosis not present

## 2015-07-11 LAB — POCT INR
INR: 2.4
PT: 29.2

## 2015-07-11 NOTE — Patient Instructions (Signed)
Anticoagulation Dose Instructions as of 07/11/2015      Chase [name] Tue Wed Thu Fri Sat   New Dose 10 mg 5 mg 10 mg 10 mg 10 mg 5 mg 10 mg    Description        Continue same dose, coumadin 5 mg M and F and 10 mg the other days. Come back in 4 weeks. Call if any questions or concerns.

## 2015-07-24 ENCOUNTER — Telehealth: Payer: Self-pay | Admitting: Family Medicine

## 2015-07-24 MED ORDER — SCOPOLAMINE 1 MG/3DAYS TD PT72: 1.0000 | MEDICATED_PATCH | TRANSDERMAL | Status: DC

## 2015-07-24 NOTE — Telephone Encounter (Signed)
Pt states he is going on a cruise for 8 days on May 7th and is requesting a Rx for sea sickness patch.  [facility].  CB#[phone]

## 2015-07-24 NOTE — Telephone Encounter (Signed)
Is this okay? Chase [name], CMA  

## 2015-07-24 NOTE — Telephone Encounter (Signed)
rx sent to [facility]

## 2015-07-24 NOTE — Telephone Encounter (Signed)
Informed pt. Chase [name], CMA  

## 2015-08-08 ENCOUNTER — Ambulatory Visit (INDEPENDENT_AMBULATORY_CARE_PROVIDER_SITE_OTHER): Payer: 59 | Admitting: *Deleted

## 2015-08-08 DIAGNOSIS — I82409 Acute embolism and thrombosis of unspecified deep veins of unspecified lower extremity: Secondary | ICD-10-CM | POA: Diagnosis not present

## 2015-08-08 LAB — POCT INR
INR: 1.9
PT: 23

## 2015-08-08 NOTE — Patient Instructions (Signed)
Anticoagulation Dose Instructions as of 08/08/2015      Chase [name] Tue Wed Thu Fri Sat   New Dose 10 mg 5 mg 10 mg 10 mg 10 mg 5 mg 10 mg    Description        Continue same dose, coumadin 5 mg M and F and 10 mg the other days. Come back in 4 weeks. Call if any questions or concerns.

## 2015-09-05 ENCOUNTER — Ambulatory Visit: Payer: 59

## 2015-09-14 ENCOUNTER — Ambulatory Visit (INDEPENDENT_AMBULATORY_CARE_PROVIDER_SITE_OTHER): Payer: Medicare Other

## 2015-09-14 DIAGNOSIS — I82409 Acute embolism and thrombosis of unspecified deep veins of unspecified lower extremity: Secondary | ICD-10-CM

## 2015-09-14 LAB — POCT INR
INR: 2.3
PT: 28.1

## 2015-09-14 NOTE — Patient Instructions (Signed)
Anticoagulation Dose Instructions as of 09/14/2015      Dorene Grebe Tue Wed Thu Fri Sat   New Dose 10 mg 5 mg 10 mg 10 mg 10 mg 5 mg 10 mg    Description        Continue same dose, coumadin 5 mg M and F and 10 mg the other days. Come back in 4 weeks. Call if any questions or concerns.

## 2015-10-12 ENCOUNTER — Ambulatory Visit: Payer: Self-pay

## 2015-10-19 ENCOUNTER — Ambulatory Visit (INDEPENDENT_AMBULATORY_CARE_PROVIDER_SITE_OTHER): Payer: Medicare Other

## 2015-10-19 DIAGNOSIS — I82409 Acute embolism and thrombosis of unspecified deep veins of unspecified lower extremity: Secondary | ICD-10-CM | POA: Diagnosis not present

## 2015-10-19 LAB — POCT INR
INR: 2.7
PT: 32.1

## 2015-10-19 NOTE — Patient Instructions (Addendum)
Anticoagulation Dose Instructions as of 10/19/2015      Chase [name] Tue Wed Thu Fri Sat   New Dose 10 mg 5 mg 10 mg 10 mg 10 mg 5 mg 10 mg    Description        Continue same dose, coumadin 5 mg M and F and 10 mg the other days. Come back in 4 weeks. Call if any questions or concerns.

## 2015-11-01 ENCOUNTER — Encounter: Payer: Self-pay | Admitting: Family Medicine

## 2015-11-01 ENCOUNTER — Ambulatory Visit (INDEPENDENT_AMBULATORY_CARE_PROVIDER_SITE_OTHER): Payer: Medicare Other | Admitting: Family Medicine

## 2015-11-01 VITALS — BP 102/64 | HR 72 | Temp 98.5°F | Resp 20 | Ht 70.5 in | Wt 215.0 lb

## 2015-11-01 DIAGNOSIS — J069 Acute upper respiratory infection, unspecified: Secondary | ICD-10-CM

## 2015-11-01 MED ORDER — HYDROCODONE-HOMATROPINE 5-1.5 MG/5ML PO SYRP: ORAL_SOLUTION | ORAL | Status: DC

## 2015-11-01 NOTE — Progress Notes (Signed)
Subjective:     Patient ID: Chase [name], male   DOB: 1951-02-04, 65 y.o.   MRN: [id number]  HPI  Chief Complaint  Patient presents with  . Sinusitis    cough, congestion sorethroat and no energy x's 4 days. Patient reports takeing OTC Mucinex.  States wife was sick first. Continues to smoke 1.5 ppd. Has been taking Mucinex for his sx.   Review of Systems     Objective:   Physical Exam  Constitutional: He appears well-developed and well-nourished. No distress.  Ears: T.M's intact without inflammation Throat: no tonsillar enlargement or exudate. Mild posterior pharyngeal erythema Neck: no cervical adenopathy Lungs: clear     Assessment:    1. Upper respiratory infection - HYDROcodone-homatropine (HYCODAN) 5-1.5 MG/5ML syrup; 5 ml 4-6 hours as needed for cough  Dispense: 240 mL; Refill: 0    Plan:    Discussed use of Mucinex D and Delsym. Call if sinuses not improving by 7/10.

## 2015-11-01 NOTE — Patient Instructions (Signed)
Discussed use of Mucinex D and Delsym for cough. Call if sinuses not improving Monday or so.

## 2015-11-16 ENCOUNTER — Ambulatory Visit: Payer: Self-pay

## 2015-11-23 ENCOUNTER — Other Ambulatory Visit: Payer: Self-pay

## 2015-11-23 ENCOUNTER — Ambulatory Visit (INDEPENDENT_AMBULATORY_CARE_PROVIDER_SITE_OTHER): Payer: Medicare Other | Admitting: Family Medicine

## 2015-11-23 DIAGNOSIS — I824Z3 Acute embolism and thrombosis of unspecified deep veins of distal lower extremity, bilateral: Secondary | ICD-10-CM | POA: Diagnosis not present

## 2015-11-23 DIAGNOSIS — Z23 Encounter for immunization: Secondary | ICD-10-CM | POA: Diagnosis not present

## 2015-11-23 DIAGNOSIS — IMO0002 Reserved for concepts with insufficient information to code with codable children: Secondary | ICD-10-CM

## 2015-11-23 LAB — POCT INR
INR: 3
PT: 36.4

## 2015-11-23 NOTE — Progress Notes (Signed)
Here for [facility] but sustained a superficial laceration to his right lower leg from a board at his home. Bandaids applied and encouraged soap and water cleansing daily. Will update Tetanus.

## 2015-11-23 NOTE — Patient Instructions (Signed)
Anticoagulation Dose Instructions as of 11/23/2015      Chase [name] Tue Wed Thu Fri Sat   New Dose 10 mg 5 mg 10 mg 10 mg 10 mg 5 mg 10 mg    Description   Continue same dose, coumadin 5 mg M and F and 10 mg the other days. Come back in 4 weeks. Call if any questions or concerns.

## 2015-11-24 ENCOUNTER — Other Ambulatory Visit: Payer: Self-pay | Admitting: Family Medicine

## 2015-12-26 ENCOUNTER — Ambulatory Visit (INDEPENDENT_AMBULATORY_CARE_PROVIDER_SITE_OTHER): Payer: Medicare Other

## 2015-12-26 DIAGNOSIS — I824Z3 Acute embolism and thrombosis of unspecified deep veins of distal lower extremity, bilateral: Secondary | ICD-10-CM

## 2015-12-26 LAB — POCT INR: INR: 2.1

## 2016-01-25 ENCOUNTER — Ambulatory Visit (INDEPENDENT_AMBULATORY_CARE_PROVIDER_SITE_OTHER): Payer: Medicare Other

## 2016-01-25 DIAGNOSIS — I824Z3 Acute embolism and thrombosis of unspecified deep veins of distal lower extremity, bilateral: Secondary | ICD-10-CM | POA: Diagnosis not present

## 2016-01-25 LAB — POCT INR
INR: 3.1
PT: 36.9

## 2016-01-25 NOTE — Patient Instructions (Signed)
Anticoagulation Dose Instructions as of 01/25/2016      Chase [name] Tue Wed Thu Fri Sat   New Dose 10 mg 5 mg 10 mg 10 mg 10 mg 5 mg 10 mg    Description   Continue same dose, coumadin 5 mg M and F and 10 mg the other days. Come back in 4 weeks. Call if any questions or concerns.

## 2016-02-04 ENCOUNTER — Telehealth: Payer: Self-pay | Admitting: Family Medicine

## 2016-02-04 MED ORDER — WARFARIN SODIUM 5 MG PO TABS: ORAL_TABLET | ORAL | 3 refills | Status: DC

## 2016-02-04 MED ORDER — AMLODIPINE BESY-BENAZEPRIL HCL 5-20 MG PO CAPS: 1.0000 | ORAL_CAPSULE | Freq: Every day | ORAL | 4 refills | Status: DC

## 2016-02-04 MED ORDER — VENLAFAXINE HCL ER 150 MG PO TB24: 1.0000 | ORAL_TABLET | Freq: Every day | ORAL | 4 refills | Status: DC

## 2016-02-04 NOTE — Telephone Encounter (Signed)
Pt's wife called saying they have changed insurance companies,.  Their Rx's now is [employer] and [facility].  Chase [name] needs refills his   Warfarin 5 mg Venlafaxine HCI 150 mg   [facility]  Thank sTeri

## 2016-02-04 NOTE — Telephone Encounter (Signed)
Pt called back saying he will need the amlodipine 5-20 mg next month if you want to go ahead and order it.  Con Memos

## 2016-02-08 ENCOUNTER — Telehealth: Payer: Self-pay | Admitting: Family Medicine

## 2016-02-08 MED ORDER — AMLODIPINE BESY-BENAZEPRIL HCL 5-20 MG PO CAPS: 1.0000 | ORAL_CAPSULE | Freq: Every day | ORAL | 5 refills | Status: DC

## 2016-02-08 MED ORDER — WARFARIN SODIUM 5 MG PO TABS: ORAL_TABLET | ORAL | 5 refills | Status: DC

## 2016-02-08 MED ORDER — VENLAFAXINE HCL ER 150 MG PO TB24: 1.0000 | ORAL_TABLET | Freq: Every day | ORAL | 4 refills | Status: DC

## 2016-02-08 NOTE — Telephone Encounter (Signed)
Pt's wife called in Monday 02/04/16 and requested the following medication refills: 1. warfarin (COUMADIN) 5 MG tablet  2. Venlafaxine HCl 150 MG TB24  3. amLODipine-benazepril (LOTREL) 5-20 MG capsule  [name] [facility] of [employer] but [facility]. [name] would also like them sent in a 30 day supply instead of 90 day. She stated pt will be out of Warfarin Sunday or Monday and would like the RX [facility] today if possible. Please advise. Thanks TNP

## 2016-02-08 NOTE — Telephone Encounter (Signed)
[facility]? Please advise. Thanks!

## 2016-02-12 ENCOUNTER — Telehealth: Payer: Self-pay | Admitting: Family Medicine

## 2016-02-12 NOTE — Telephone Encounter (Signed)
Fax was received and given to provider.

## 2016-02-12 NOTE — Telephone Encounter (Signed)
Pt's wife called.  There is a mix up with insurance and the Venlafaxine .    Pt's wife said she spoke with Caryl Pina at [facility].  Her call back is [phone]. [phone]  She was suppose fax or call our office about specifics on how the rx is to be written.  He can't get tablets but he can get capsules.  Thanks [employer]

## 2016-02-13 ENCOUNTER — Telehealth: Payer: Self-pay | Admitting: *Deleted

## 2016-02-13 MED ORDER — VENLAFAXINE HCL ER 75 MG PO CP24: 75.0000 mg | ORAL_CAPSULE | Freq: Every day | ORAL | 2 refills | Status: DC

## 2016-02-13 MED ORDER — VENLAFAXINE HCL ER 75 MG PO CP24: 75.0000 mg | ORAL_CAPSULE | Freq: Every day | ORAL | 0 refills | Status: DC

## 2016-02-13 NOTE — Telephone Encounter (Signed)
[place] called office concerning pt's rx for venlafaxine 150 mg tabs qd. [employer] stated that patient's insurance will no longer cover venlafaxine in the tablet form. Insurance will cover venlafaxine in capsule form. Pharmacy wants to know if this is okay to change and if so, they will need a new prescription for the capsules sent to [facility]. Also requesting a 10 day [facility] because pt is almost out of his medication.

## 2016-02-22 ENCOUNTER — Ambulatory Visit (INDEPENDENT_AMBULATORY_CARE_PROVIDER_SITE_OTHER): Payer: Medicare Other

## 2016-02-22 DIAGNOSIS — I824Z3 Acute embolism and thrombosis of unspecified deep veins of distal lower extremity, bilateral: Secondary | ICD-10-CM

## 2016-02-22 LAB — POCT INR
INR: 2.6
PT: 31.5

## 2016-02-22 NOTE — Patient Instructions (Signed)
Anticoagulation Dose Instructions as of 02/22/2016      Dorene Grebe Tue Wed Thu Fri Sat   New Dose 10 mg 5 mg 10 mg 10 mg 10 mg 5 mg 10 mg    Description   Continue same dose, coumadin 5 mg M and F and 10 mg the other days. Come back in 4 weeks. Call if any questions or concerns.

## 2016-03-26 ENCOUNTER — Ambulatory Visit: Payer: Self-pay

## 2016-04-04 ENCOUNTER — Ambulatory Visit (INDEPENDENT_AMBULATORY_CARE_PROVIDER_SITE_OTHER): Payer: Medicare Other

## 2016-04-04 DIAGNOSIS — I824Z3 Acute embolism and thrombosis of unspecified deep veins of distal lower extremity, bilateral: Secondary | ICD-10-CM

## 2016-04-04 LAB — POCT INR
INR: 2.5
PT: 29.9

## 2016-04-04 NOTE — Patient Instructions (Signed)
Anticoagulation Dose Instructions as of 04/04/2016      Dorene Grebe Tue Wed Thu Fri Sat   New Dose 10 mg 5 mg 10 mg 10 mg 10 mg 5 mg 10 mg    Description   10 mg daily except 5 mg on M & F F/U 4 weeks Call if any questions or concerns.

## 2016-05-02 ENCOUNTER — Ambulatory Visit (INDEPENDENT_AMBULATORY_CARE_PROVIDER_SITE_OTHER): Payer: Medicare Other

## 2016-05-02 DIAGNOSIS — I824Z3 Acute embolism and thrombosis of unspecified deep veins of distal lower extremity, bilateral: Secondary | ICD-10-CM | POA: Diagnosis not present

## 2016-05-02 LAB — POCT INR
INR: 2.1
PT: 25.1

## 2016-05-02 NOTE — Patient Instructions (Signed)
Anticoagulation Dose Instructions as of 05/02/2016      Dorene Grebe Tue Wed Thu Fri Sat   New Dose 5 mg 7.5 mg 5 mg 5 mg 5 mg 7.5 mg 5 mg    Description   10 mg daily except 5 mg on M & F F/U 4 weeks Call if any questions or concerns.

## 2016-05-30 ENCOUNTER — Ambulatory Visit: Payer: Medicare Other

## 2016-05-30 DIAGNOSIS — I824Z3 Acute embolism and thrombosis of unspecified deep veins of distal lower extremity, bilateral: Secondary | ICD-10-CM

## 2016-05-30 NOTE — Patient Instructions (Signed)
Anticoagulation Dose Instructions as of 05/30/2016      Dorene Grebe Tue Wed Thu Fri Sat   New Dose 5 mg 7.5 mg 5 mg 5 mg 5 mg 7.5 mg 5 mg    Description   10 mg daily except 5 mg on M & F F/U 4 weeks

## 2016-06-27 ENCOUNTER — Ambulatory Visit (INDEPENDENT_AMBULATORY_CARE_PROVIDER_SITE_OTHER): Payer: Medicare Other

## 2016-06-27 DIAGNOSIS — I824Z3 Acute embolism and thrombosis of unspecified deep veins of distal lower extremity, bilateral: Secondary | ICD-10-CM

## 2016-06-27 LAB — POCT INR
INR: 4
PT: 48.1

## 2016-06-27 NOTE — Patient Instructions (Signed)
Anticoagulation Dose Instructions as of 06/27/2016      Chase [name] Tue Wed Thu Fri Sat   New Dose 5 mg 7.5 mg 5 mg 5 mg 5 mg 7.5 mg 5 mg    Description   Hold x 3 days then resume 5 mg daily except 7.5 mg M and F

## 2016-07-11 ENCOUNTER — Encounter: Payer: Self-pay | Admitting: Family Medicine

## 2016-07-11 ENCOUNTER — Ambulatory Visit (INDEPENDENT_AMBULATORY_CARE_PROVIDER_SITE_OTHER): Payer: Medicare Other | Admitting: Family Medicine

## 2016-07-11 VITALS — BP 140/82 | HR 66 | Temp 97.9°F | Resp 16 | Wt 227.0 lb

## 2016-07-11 DIAGNOSIS — M2550 Pain in unspecified joint: Secondary | ICD-10-CM

## 2016-07-11 DIAGNOSIS — Q211 Atrial septal defect: Secondary | ICD-10-CM | POA: Diagnosis not present

## 2016-07-11 DIAGNOSIS — I10 Essential (primary) hypertension: Secondary | ICD-10-CM

## 2016-07-11 DIAGNOSIS — I824Z3 Acute embolism and thrombosis of unspecified deep veins of distal lower extremity, bilateral: Secondary | ICD-10-CM | POA: Diagnosis not present

## 2016-07-11 DIAGNOSIS — Q2111 Secundum atrial septal defect: Secondary | ICD-10-CM

## 2016-07-11 DIAGNOSIS — I6990 Unspecified sequelae of unspecified cerebrovascular disease: Secondary | ICD-10-CM | POA: Diagnosis not present

## 2016-07-11 LAB — POCT INR
INR: 2.7
PT: 31.9

## 2016-07-11 NOTE — Progress Notes (Signed)
[name] Male    DOB: Jul 08, 1950   66 y.o.   MRN: [id number] Visit Date: 07/11/2016  Today's Provider: Lelon Huh, MD   Chief Complaint  Patient presents with  . Follow-up  . Hypertension  . Depression  . DVT   Subjective:    HPI  Hypertension, follow-up:  BP Readings from Last 3 Encounters:  11/01/15 102/64  06/25/15 140/80  05/14/15 130/72    He was last seen for hypertension 1 years ago.  BP at that visit was 130/72. Management since that visit includes no changes. He reports good compliance with treatment. He is not having side effects.  He is exercising (yard work) He is adherent to low salt diet.   Outside blood pressures are not being checked. He is experiencing none.  Patient denies chest pain, chest pressure/discomfort, claudication, dyspnea, exertional chest pressure/discomfort, fatigue, irregular heart beat, lower extremity edema, near-syncope, orthopnea, palpitations, paroxysmal nocturnal dyspnea, syncope and tachypnea.   Cardiovascular risk factors include advanced age (older than 98 for men, 62 for women), hypertension and male gender.  Use of agents associated with hypertension: none.     Weight trend: increasing steadily Wt Readings from Last 3 Encounters:  11/01/15 215 lb (97.5 kg)  06/25/15 216 lb (98 kg)  05/14/15 222 lb 9.6 oz (101 kg)    Current diet: in general, a "healthy" diet    ------------------------------------------------------------------------ Follow up of Depression:  Patient was last seen for this problem over 1 year ago and no changes were made. Patient was to continue Effexor. Patient reports this condition is stable.   Coumadin Management:  Patient is here to PT/INR recheck.   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 Remains on lifetime anticoagulation. Still has very poor short term memory which has not changed.     No Known Allergies   Current Outpatient  Prescriptions:  .  acetaminophen (TYLENOL) 325 MG tablet, Take 2 tablets (650 mg total) by mouth every 6 (six) hours as needed for pain., Disp: 60 tablet, Rfl: 0 .  amLODipine-benazepril (LOTREL) 5-20 MG capsule, Take 1 capsule by mouth daily., Disp: 30 capsule, Rfl: 5 .  fluticasone (FLONASE) 50 MCG/ACT nasal spray, Place 2 sprays into the nose daily., Disp: , Rfl:  .  venlafaxine XR (EFFEXOR-XR) 75 MG 24 hr capsule, Take 1 capsule (75 mg total) by mouth daily with breakfast., Disp: 90 capsule, Rfl: 2 .  warfarin (COUMADIN) 5 MG tablet, Take 5 or 10mg  daily as directed by physician, Disp: 60 tablet, Rfl: 5  Review of Systems  Constitutional: Negative for appetite change, chills and fever.  Respiratory: Negative for chest tightness, shortness of breath and wheezing.   Cardiovascular: Negative for chest pain and palpitations.  Gastrointestinal: Negative for abdominal pain, nausea and vomiting.  Musculoskeletal: Positive for arthralgias.    Social History  Substance Use Topics  . Smoking status: Former Smoker    Packs/day: 1.00    Years: 49.00    Types: Cigarettes    Quit date: 06/13/2016  . Smokeless tobacco: Never Used     Comment: started smoking at age 62  . Alcohol use 25.2 oz/week    42 Cans of beer per week     Comment: 4-6 beers a day   Objective:   BP 140/82 (BP Location: Left Arm, Patient Position: Sitting, Cuff Size: Large)   Pulse 66   Temp 97.9 F (36.6 C) (Oral)   Resp 16  Wt 227 lb (103 kg)   SpO2 96% Comment: room air  BMI 32.11 kg/m  There were no vitals filed for this visit.   Physical Exam   General Appearance:    Alert, cooperative, no distress  Eyes:    PERRL, conjunctiva/corneas clear, EOM's intact       Lungs:     Clear to auscultation bilaterally, respirations unlabored  Heart:    Regular rate and rhythm  Neurologic:   Awake, alert, oriented x 3. No apparent focal neurological           defect.           Assessment & Plan:     1. Deep vein  thrombosis (DVT) of distal vein of both lower extremities, unspecified chronicity (HCC)  - POCT INR  2. Essential hypertension  - Lipid panel - Comprehensive metabolic panel  3. Ostium secundum type atrial septal defect   4. Arthralgia, unspecified joint  - CBC - Uric acid - Rheumatoid Factor  5. Cerebrovascular accident, late effects Unchanged. Remains unable to work due to inability to follow instructions or remain on task due to poor memory.        Lelon Huh, MD  [facility]

## 2016-07-11 NOTE — Patient Instructions (Signed)
Continue 10mg  QD except 5mg  on Monday and Friday. Recheck in 4 weeks.

## 2016-07-12 LAB — COMPREHENSIVE METABOLIC PANEL
A/G RATIO: 1.8 (ref 1.2–2.2)
ALBUMIN: 4.4 g/dL (ref 3.6–4.8)
ALK PHOS: 51 IU/L (ref 39–117)
ALT: 26 IU/L (ref 0–44)
AST: 28 IU/L (ref 0–40)
BILIRUBIN TOTAL: 0.3 mg/dL (ref 0.0–1.2)
BUN / CREAT RATIO: 18 (ref 10–24)
BUN: 16 mg/dL (ref 8–27)
CHLORIDE: 99 mmol/L (ref 96–106)
CO2: 26 mmol/L (ref 18–29)
Calcium: 9.1 mg/dL (ref 8.6–10.2)
Creatinine, Ser: 0.91 mg/dL (ref 0.76–1.27)
GFR calc non Af Amer: 88 mL/min/{1.73_m2} (ref >59)
GFR, EST AFRICAN AMERICAN: 102 mL/min/{1.73_m2} (ref >59)
GLUCOSE: 99 mg/dL (ref 65–99)
Globulin, Total: 2.4 g/dL (ref 1.5–4.5)
POTASSIUM: 4.6 mmol/L (ref 3.5–5.2)
SODIUM: 139 mmol/L (ref 134–144)
TOTAL PROTEIN: 6.8 g/dL (ref 6.0–8.5)

## 2016-07-12 LAB — LIPID PANEL
CHOLESTEROL TOTAL: 180 mg/dL (ref 100–199)
Chol/HDL Ratio: 3.1 ratio units (ref 0.0–5.0)
HDL: 59 mg/dL (ref >39)
LDL Calculated: 105 mg/dL — ABNORMAL HIGH (ref 0–99)
Triglycerides: 79 mg/dL (ref 0–149)
VLDL CHOLESTEROL CAL: 16 mg/dL (ref 5–40)

## 2016-07-12 LAB — RHEUMATOID FACTOR

## 2016-07-12 LAB — CBC
Hematocrit: 43 % (ref 37.5–51.0)
Hemoglobin: 14.2 g/dL (ref 13.0–17.7)
MCH: 31.4 pg (ref 26.6–33.0)
MCHC: 33 g/dL (ref 31.5–35.7)
MCV: 95 fL (ref 79–97)
PLATELETS: 195 10*3/uL (ref 150–379)
RBC: 4.52 x10E6/uL (ref 4.14–5.80)
RDW: 14.8 % (ref 12.3–15.4)
WBC: 6.3 10*3/uL (ref 3.4–10.8)

## 2016-07-12 LAB — URIC ACID: URIC ACID: 7.4 mg/dL (ref 3.7–8.6)

## 2016-07-14 ENCOUNTER — Telehealth: Payer: Self-pay

## 2016-07-14 DIAGNOSIS — M2550 Pain in unspecified joint: Secondary | ICD-10-CM

## 2016-07-14 NOTE — Telephone Encounter (Signed)
-----   Message from Birdie Sons, MD sent at 07/13/2016  9:41 PM EDT ----- Labs all completely normal. Cholesterol is good at 180

## 2016-07-14 NOTE — Telephone Encounter (Signed)
He can take two extra strength tylenol three times a day.

## 2016-07-14 NOTE — Telephone Encounter (Signed)
Patient wants to know if there is anything else he can take to help his joint pain?

## 2016-07-14 NOTE — Telephone Encounter (Signed)
Spoke with [name] pt's daughter and said she will let him know to call back.  Thanks,  -Joseline

## 2016-07-15 NOTE — Telephone Encounter (Signed)
Busy, will try again later.

## 2016-07-17 NOTE — Telephone Encounter (Signed)
Called and advised patient wife [name] as below. She states patient is already taking Tylenol Extra strength 2 pills three times a day. [name] wants to know what is causing his joints to ache all the time? Can more tests be run to find out? She wants to know if it could be rocky [place] spotted fever, Vitamin D Deficiency, etc. She is willing to bring patient back in for evaluation if necessary.

## 2016-07-17 NOTE — Telephone Encounter (Signed)
No, he does not have rocky [place] spotted fever or lyme disease. Test for rheumatoid arthritis was normal. Recommend referral to rheumatologist for further evaluation.  Please advise and enter order if patient is agreeable.

## 2016-07-17 NOTE — Telephone Encounter (Signed)
I advised Chase [name] as below and she agrees to Rheumatology referral. Order placed, please schedule. Thanks.

## 2016-08-08 ENCOUNTER — Ambulatory Visit (INDEPENDENT_AMBULATORY_CARE_PROVIDER_SITE_OTHER): Payer: Medicare Other | Admitting: Emergency Medicine

## 2016-08-08 DIAGNOSIS — I824Z3 Acute embolism and thrombosis of unspecified deep veins of distal lower extremity, bilateral: Secondary | ICD-10-CM

## 2016-08-08 LAB — POCT INR
INR: 2.5
PT: 30.3

## 2016-08-08 NOTE — Patient Instructions (Signed)
Anticoagulation Dose Instructions as of 08/08/2016      Dorene Grebe Tue Wed Thu Fri Sat   New Dose 5 mg 10 mg 5 mg 5 mg 5 mg 10 mg 5 mg    Description   No change. Continue 10mg  QD except 5mg  on Monday and Friday

## 2016-08-18 DIAGNOSIS — Z13820 Encounter for screening for osteoporosis: Secondary | ICD-10-CM | POA: Insufficient documentation

## 2016-08-18 DIAGNOSIS — Z87891 Personal history of nicotine dependence: Secondary | ICD-10-CM | POA: Diagnosis not present

## 2016-08-18 DIAGNOSIS — F17200 Nicotine dependence, unspecified, uncomplicated: Secondary | ICD-10-CM | POA: Insufficient documentation

## 2016-08-18 DIAGNOSIS — M2550 Pain in unspecified joint: Secondary | ICD-10-CM | POA: Diagnosis not present

## 2016-08-18 DIAGNOSIS — I10 Essential (primary) hypertension: Secondary | ICD-10-CM | POA: Diagnosis not present

## 2016-08-18 DIAGNOSIS — Z7901 Long term (current) use of anticoagulants: Secondary | ICD-10-CM | POA: Insufficient documentation

## 2016-08-18 DIAGNOSIS — E569 Vitamin deficiency, unspecified: Secondary | ICD-10-CM | POA: Diagnosis not present

## 2016-08-18 DIAGNOSIS — R918 Other nonspecific abnormal finding of lung field: Secondary | ICD-10-CM | POA: Diagnosis not present

## 2016-09-05 ENCOUNTER — Ambulatory Visit (INDEPENDENT_AMBULATORY_CARE_PROVIDER_SITE_OTHER): Payer: Medicare Other | Admitting: Emergency Medicine

## 2016-09-05 DIAGNOSIS — I824Z3 Acute embolism and thrombosis of unspecified deep veins of distal lower extremity, bilateral: Secondary | ICD-10-CM | POA: Diagnosis not present

## 2016-09-05 LAB — POCT INR
INR: 2.9
PT: 34.5

## 2016-09-05 NOTE — Patient Instructions (Signed)
Anticoagulation Warfarin Dose Instructions as of 09/05/2016      Chase [name] Tue Wed Thu Fri Sat   New Dose 5 mg 10 mg 5 mg 5 mg 5 mg 10 mg 5 mg    Description   No change. Continue 10mg  QD except 5mg  on Monday and Friday

## 2016-09-17 ENCOUNTER — Other Ambulatory Visit: Payer: Self-pay | Admitting: Family Medicine

## 2016-10-10 ENCOUNTER — Ambulatory Visit (INDEPENDENT_AMBULATORY_CARE_PROVIDER_SITE_OTHER): Payer: Medicare Other

## 2016-10-10 DIAGNOSIS — I824Z3 Acute embolism and thrombosis of unspecified deep veins of distal lower extremity, bilateral: Secondary | ICD-10-CM | POA: Diagnosis not present

## 2016-10-10 LAB — POCT INR
INR: 4.6
PT: 55.4

## 2016-10-10 NOTE — Patient Instructions (Signed)
Anticoagulation Warfarin Dose Instructions as of 10/10/2016      Chase [name] Tue Wed Thu Fri Sat   New Dose 10 mg 5 mg 10 mg 10 mg 10 mg 5 mg 10 mg    Description   Hold for 3 days sthen resume 10mg  QD except 5mg  on Monday and Friday F/u 2 weeks

## 2016-10-24 ENCOUNTER — Ambulatory Visit (INDEPENDENT_AMBULATORY_CARE_PROVIDER_SITE_OTHER): Payer: Medicare Other

## 2016-10-24 DIAGNOSIS — I824Z3 Acute embolism and thrombosis of unspecified deep veins of distal lower extremity, bilateral: Secondary | ICD-10-CM | POA: Diagnosis not present

## 2016-10-24 LAB — POCT INR
INR: 2.3
PT: 27.2

## 2016-10-24 NOTE — Patient Instructions (Signed)
Anticoagulation Warfarin Dose Instructions as of 10/24/2016      Chase [name] Tue Wed Thu Fri Sat   New Dose 10 mg 5 mg 10 mg 10 mg 10 mg 5 mg 10 mg    Description   10mg  QD except 5mg  on Monday and Friday F/u 4 weeks

## 2016-11-18 ENCOUNTER — Other Ambulatory Visit: Payer: Self-pay | Admitting: Family Medicine

## 2016-11-21 ENCOUNTER — Ambulatory Visit (INDEPENDENT_AMBULATORY_CARE_PROVIDER_SITE_OTHER): Payer: Medicare Other

## 2016-11-21 DIAGNOSIS — I824Z3 Acute embolism and thrombosis of unspecified deep veins of distal lower extremity, bilateral: Secondary | ICD-10-CM

## 2016-11-21 LAB — POCT INR
INR: 2.2
PT: 26.6

## 2016-11-21 NOTE — Patient Instructions (Signed)
Anticoagulation Warfarin Dose Instructions as of 11/21/2016      Chase [name] Tue Wed Thu Fri Sat   New Dose 10 mg 5 mg 10 mg 10 mg 10 mg 5 mg 10 mg    Description   10mg  QD except 5mg  on Monday and Friday F/u 4 weeks

## 2016-12-01 ENCOUNTER — Ambulatory Visit: Payer: Medicare Other | Admitting: Family Medicine

## 2016-12-18 ENCOUNTER — Ambulatory Visit (INDEPENDENT_AMBULATORY_CARE_PROVIDER_SITE_OTHER): Payer: Medicare Other | Admitting: Family Medicine

## 2016-12-18 ENCOUNTER — Encounter: Payer: Self-pay | Admitting: Family Medicine

## 2016-12-18 VITALS — BP 160/98 | HR 68 | Temp 97.9°F | Resp 16 | Wt 222.0 lb

## 2016-12-18 DIAGNOSIS — F321 Major depressive disorder, single episode, moderate: Secondary | ICD-10-CM | POA: Diagnosis not present

## 2016-12-18 DIAGNOSIS — I824Z3 Acute embolism and thrombosis of unspecified deep veins of distal lower extremity, bilateral: Secondary | ICD-10-CM

## 2016-12-18 DIAGNOSIS — Z23 Encounter for immunization: Secondary | ICD-10-CM | POA: Diagnosis not present

## 2016-12-18 LAB — POCT INR
INR: 3.1
PT: 37.8

## 2016-12-18 MED ORDER — VENLAFAXINE HCL ER 75 MG PO CP24: 150.0000 mg | ORAL_CAPSULE | Freq: Every morning | ORAL | 0 refills | Status: DC

## 2016-12-18 NOTE — Patient Instructions (Addendum)
Anticoagulation Warfarin Dose Instructions as of 12/18/2016      Chase [name] Tue Wed Thu Fri Sat   New Dose 10 mg 5 mg 10 mg 10 mg 10 mg 5 mg 10 mg    Description   Dx: DVT  I82.4Z3 Current coumadin dosage: 10mg  QD except 5mg  on Monday and Friday PT:37.8 INR:3.1 Todays changes: NO CHANGE Recheck: 4 weeks       The CDC recommends two doses of Shingrix (the shingles vaccine) separated by 2 to 6 months for adults age 66 years and older. I recommend checking with your insurance plan regarding coverage for this vaccine.

## 2016-12-18 NOTE — Progress Notes (Signed)
[name] Male    DOB: [birth year]   66 y.o.   MRN: [id number] Visit Date: 12/18/2016  Today's Provider: Lelon Huh, MD   Chief Complaint  Patient presents with  . Depression   Subjective:    HPI Depression:  Patient was last seen for this problem 7 months ago and no changes were maed. During that visit patient was doing well on Effexor. Today patient comes in reporting that his depression has worsened and he has been having anxiety attacks whenever he has to complete a task. Patient states this has been ongoing for several months and is progressively worsening. Last changed dose of venlafaxine from 150mg  to 75mg  a day in October, 2017. He is not sure when anxiety and depression worsened. He has also reduced smoking  from 3ppd to 1ppd  Wt Readings from Last 3 Encounters:  12/18/16 222 lb (100.7 kg)  07/11/16 227 lb (103 kg)  11/01/15 215 lb (97.5 kg)       No Known Allergies   Current Outpatient Prescriptions:  .  acetaminophen (TYLENOL) 325 MG tablet, Take 2 tablets (650 mg total) by mouth every 6 (six) hours as needed for pain., Disp: 60 tablet, Rfl: 0 .  amLODipine-benazepril (LOTREL) 5-20 MG capsule, Take 1 capsule by mouth daily., Disp: 30 capsule, Rfl: 5 .  fluticasone (FLONASE) 50 MCG/ACT nasal spray, Place 2 sprays into the nose daily., Disp: , Rfl:  .  venlafaxine XR (EFFEXOR-XR) 75 MG 24 hr capsule, TAKE 1 CAPSULE DAILY WITH BREAKFAST., Disp: 90 capsule, Rfl: 4 .  warfarin (COUMADIN) 5 MG tablet, TAKE 1 OR 2 TABLETS (5 OR 10MG ) DAILY AS DIRECTED BY PHYSICIAN, Disp: 180 tablet, Rfl: 3  Review of Systems  Constitutional: Positive for diaphoresis. Negative for appetite change, chills and fever.  Respiratory: Positive for shortness of breath. Negative for chest tightness and wheezing.   Cardiovascular: Positive for palpitations. Negative for chest pain.  Gastrointestinal: Negative for abdominal pain, nausea and vomiting.  Neurological: Positive  for tremors.  Psychiatric/Behavioral: Positive for agitation, confusion, decreased concentration, dysphoric mood and sleep disturbance. Negative for hallucinations, self-injury and suicidal ideas. The patient is nervous/anxious.     Social History  Substance Use Topics  . Smoking status: Current Some Day Smoker    Packs/day: 0.50    Years: 49.00    Types: Cigarettes    Last attempt to quit: 06/13/2016  . Smokeless tobacco: Never Used     Comment: started smoking at age 35  . Alcohol use 25.2 oz/week    42 Cans of beer per week     Comment: 4-6 beers a day   Objective:   BP (!) 160/98 (BP Location: Right Arm, Patient Position: Sitting, Cuff Size: Large)   Pulse 68   Temp 97.9 F (36.6 C) (Oral)   Resp 16   Wt 222 lb (100.7 kg)   SpO2 99% Comment: room air  BMI 31.40 kg/m  There were no vitals filed for this visit.  Depression screen PHQ 2/9 12/18/2016  Decreased Interest 3  Down, Depressed, Hopeless 3  PHQ - 2 Score 6  Altered sleeping 3  Tired, decreased energy 3  Change in appetite 3  Feeling bad or failure about yourself  3  Trouble concentrating 3  Moving slowly or fidgety/restless 3  Suicidal thoughts 0  PHQ-9 Score 24  Difficult doing work/chores Somewhat difficult    Physical Exam  General appearance: alert, well developed, well nourished, cooperative and  in no distress Head: Normocephalic, without obvious abnormality, atraumatic Respiratory: Respirations even and unlabored, normal respiratory rate Extremities: No gross deformities Skin: Skin color, texture, turgor normal. No rashes seen  Psych: Appropriate mood and affect. Neurologic: Mental status: Alert, oriented to person, place, and time, thought content appropriate.     Assessment & Plan:     1. Current moderate episode of major depressive disorder without prior episode ([place]) Has worsened in recent months. Will go back to to 150mg  (2x75mg ) venlafaxine for the next month. If better will send in new  150mg  capsule prescription. If not will transition to SSRI.   2. Deep vein thrombosis (DVT) of distal vein of both lower extremities, unspecified chronicity (HCC)  - POCT INR  3. Need for influenza vaccination  - Flu vaccine HIGH DOSE PF (Fluzone High dose)  4. Need for pneumococcal vaccination  - Pneumococcal conjugate vaccine 13-valent IM         Lelon Huh, MD  [facility]

## 2016-12-19 ENCOUNTER — Ambulatory Visit: Payer: Self-pay

## 2016-12-23 DIAGNOSIS — H43313 Vitreous membranes and strands, bilateral: Secondary | ICD-10-CM | POA: Diagnosis not present

## 2017-01-07 ENCOUNTER — Telehealth: Payer: Self-pay | Admitting: Family Medicine

## 2017-01-09 ENCOUNTER — Ambulatory Visit: Payer: Self-pay

## 2017-01-16 ENCOUNTER — Ambulatory Visit (INDEPENDENT_AMBULATORY_CARE_PROVIDER_SITE_OTHER): Payer: Medicare Other

## 2017-01-16 ENCOUNTER — Telehealth: Payer: Self-pay

## 2017-01-16 DIAGNOSIS — I824Z3 Acute embolism and thrombosis of unspecified deep veins of distal lower extremity, bilateral: Secondary | ICD-10-CM | POA: Diagnosis not present

## 2017-01-16 LAB — POCT INR
INR: 2.1
PT: 24.8

## 2017-01-16 MED ORDER — VENLAFAXINE HCL ER 150 MG PO CP24: 150.0000 mg | ORAL_CAPSULE | Freq: Every morning | ORAL | 3 refills | Status: DC

## 2017-01-16 NOTE — Patient Instructions (Signed)
Anticoagulation Warfarin Dose Instructions as of 01/16/2017      Chase [name] Tue Wed Thu Fri Sat   New Dose 10 mg 5 mg 10 mg 10 mg 10 mg 5 mg 10 mg    Description   Dx: DVT  I82.4Z3 Current coumadin dosage: 10mg  QD except 5mg  on Monday and Friday PT:2.1 INR:24.8 Todays changes: NO CHANGE Recheck: 4 weeks

## 2017-01-16 NOTE — Telephone Encounter (Signed)
Please review-Regan Mcbryar V Kamilo Och, RMA  

## 2017-01-16 NOTE — Telephone Encounter (Signed)
Patient reports that the Effexor is working for him the way he was told to take, last office visit 12/18/16. Per patient he still has enough to take but for the next new prescription he want Venlafaxine 150mg  capsule. Qty:90.

## 2017-01-16 NOTE — Telephone Encounter (Signed)
Have sent prescription for the 150mg  tablet to pharmacy. Only needs to take one capsule per day of the 150mg  capsule.

## 2017-01-19 ENCOUNTER — Other Ambulatory Visit: Payer: Self-pay

## 2017-01-19 MED ORDER — VENLAFAXINE HCL ER 150 MG PO CP24: 150.0000 mg | ORAL_CAPSULE | Freq: Every morning | ORAL | 3 refills | Status: DC

## 2017-01-19 NOTE — Telephone Encounter (Signed)
Left message advising pt.   Thanks,   -Jamarrius Salay  

## 2017-02-09 ENCOUNTER — Ambulatory Visit (INDEPENDENT_AMBULATORY_CARE_PROVIDER_SITE_OTHER): Payer: Medicare Other | Admitting: Family Medicine

## 2017-02-09 ENCOUNTER — Encounter: Payer: Self-pay | Admitting: Family Medicine

## 2017-02-09 VITALS — BP 116/76 | HR 81 | Temp 98.8°F | Resp 16 | Wt 222.0 lb

## 2017-02-09 DIAGNOSIS — F321 Major depressive disorder, single episode, moderate: Secondary | ICD-10-CM | POA: Diagnosis not present

## 2017-02-09 DIAGNOSIS — Z86718 Personal history of other venous thrombosis and embolism: Secondary | ICD-10-CM | POA: Diagnosis not present

## 2017-02-09 DIAGNOSIS — I6990 Unspecified sequelae of unspecified cerebrovascular disease: Secondary | ICD-10-CM | POA: Diagnosis not present

## 2017-02-09 DIAGNOSIS — F419 Anxiety disorder, unspecified: Secondary | ICD-10-CM

## 2017-02-09 DIAGNOSIS — F418 Other specified anxiety disorders: Secondary | ICD-10-CM | POA: Insufficient documentation

## 2017-02-09 LAB — POCT INR
INR: 2.8
PT: 33.9

## 2017-02-09 MED ORDER — VENLAFAXINE HCL ER 75 MG PO CP24: 75.0000 mg | ORAL_CAPSULE | Freq: Every day | ORAL | 1 refills | Status: DC

## 2017-02-09 NOTE — Patient Instructions (Signed)
   Call for prescription of 3x75mg  venlafaxine daily if doing better in 2-3 weeks.

## 2017-02-09 NOTE — Progress Notes (Signed)
[name] Male    DOB: [birth year]   66 y.o.   MRN: [id number] Visit Date: 02/09/2017  Today's Provider: Lelon Huh, MD   Chief Complaint  Patient presents with  . Depression   Subjective:    HPI Follow up of Depression:  Patient was last seen for this problem about 7 weeks ago. Changes made during that visit includes changing back to Velafaxine 150mg  daily. Patient reports good compliance with treatment, good tolerance and poor symptom control. Patient feels that this dosage has helps some with Anxiety. Is not having panic attacks every day anymore and they are much less severe than when he was taking lower dose. However still feels nervous and somewhat depressed. Denies any adverse effects from medication.  He Is also here for follow up warfarin management for history of CVA secondary DVT. Is doing well on current dose of warfarin.     No Known Allergies   Current Outpatient Prescriptions:  .  acetaminophen (TYLENOL) 325 MG tablet, Take 2 tablets (650 mg total) by mouth every 6 (six) hours as needed for pain., Disp: 60 tablet, Rfl: 0 .  amLODipine-benazepril (LOTREL) 5-20 MG capsule, Take 1 capsule by mouth daily., Disp: 30 capsule, Rfl: 5 .  fluticasone (FLONASE) 50 MCG/ACT nasal spray, Place 2 sprays into the nose daily., Disp: , Rfl:  .  venlafaxine XR (EFFEXOR-XR) 150 MG 24 hr capsule, Take 1 capsule (150 mg total) by mouth every morning., Disp: 90 capsule, Rfl: 3 .  warfarin (COUMADIN) 5 MG tablet, TAKE 1 OR 2 TABLETS (5 OR 10MG ) DAILY AS DIRECTED BY PHYSICIAN, Disp: 180 tablet, Rfl: 3  Review of Systems  Constitutional: Negative for appetite change, chills and fever.  Respiratory: Negative for chest tightness, shortness of breath and wheezing.   Cardiovascular: Negative for chest pain and palpitations.  Gastrointestinal: Negative for abdominal pain, nausea and vomiting.  Psychiatric/Behavioral: Positive for dysphoric mood. The patient is  nervous/anxious.     Social History  Substance Use Topics  . Smoking status: Current Some Day Smoker    Packs/day: 0.50    Years: 49.00    Types: Cigarettes    Last attempt to quit: 06/13/2016  . Smokeless tobacco: Never Used     Comment: started smoking at age 60  . Alcohol use 25.2 oz/week    42 Cans of beer per week     Comment: 4-6 beers a day   Objective:   BP 116/76 (BP Location: Left Arm, Patient Position: Sitting, Cuff Size: Large)   Pulse 81   Temp 98.8 F (37.1 C) (Oral)   Resp 16   Wt 222 lb (100.7 kg)   SpO2 97% Comment: room air  BMI 31.40 kg/m  There were no vitals filed for this visit.   Physical Exam  General appearance: alert, well developed, well nourished, cooperative and in no distress Head: Normocephalic, without obvious abnormality, atraumatic Respiratory: Respirations even and unlabored, normal respiratory rate Extremities: No gross deformities Skin: Skin color, texture, turgor normal. No rashes seen  Psych: Appropriate mood and affect. Neurologic: Mental status: Alert, oriented to person, place, and time, thought content appropriate.  Results for orders placed or performed in visit on 02/09/17  POCT INR  Result Value Ref Range   INR 2.8    PT 33.9        Assessment & Plan:     1. Anxiety Improved since increasing Effexor to 150mg , will add additional 75mg  to make 225mg   daily.   2. Current moderate episode of major depressive disorder without prior episode ([place])  3. Cerebrovascular accident, late effects   4. History of DVT in adulthood Stable INR. Continue same dose of warfarin. Repeat INR in 1 month - POCT INR         Lelon Huh, MD  [facility]

## 2017-02-13 ENCOUNTER — Ambulatory Visit: Payer: Self-pay

## 2017-02-27 ENCOUNTER — Ambulatory Visit: Payer: Self-pay

## 2017-03-04 ENCOUNTER — Ambulatory Visit: Payer: Self-pay

## 2017-03-10 ENCOUNTER — Other Ambulatory Visit: Payer: Self-pay

## 2017-03-10 DIAGNOSIS — I824Y9 Acute embolism and thrombosis of unspecified deep veins of unspecified proximal lower extremity: Secondary | ICD-10-CM

## 2017-03-11 ENCOUNTER — Ambulatory Visit: Payer: Self-pay

## 2017-03-11 DIAGNOSIS — I824Y9 Acute embolism and thrombosis of unspecified deep veins of unspecified proximal lower extremity: Secondary | ICD-10-CM | POA: Diagnosis not present

## 2017-03-12 LAB — PROTIME-INR
INR: 2.3 — AB
Prothrombin Time: 23.7 s — ABNORMAL HIGH (ref 9.0–11.5)

## 2017-03-13 ENCOUNTER — Ambulatory Visit (INDEPENDENT_AMBULATORY_CARE_PROVIDER_SITE_OTHER): Payer: Medicare Other | Admitting: Family Medicine

## 2017-03-13 ENCOUNTER — Encounter: Payer: Self-pay | Admitting: Family Medicine

## 2017-03-13 VITALS — BP 112/64 | HR 76 | Temp 98.6°F | Resp 14 | Wt 226.0 lb

## 2017-03-13 DIAGNOSIS — R5383 Other fatigue: Secondary | ICD-10-CM

## 2017-03-13 DIAGNOSIS — Z1211 Encounter for screening for malignant neoplasm of colon: Secondary | ICD-10-CM | POA: Diagnosis not present

## 2017-03-13 DIAGNOSIS — F419 Anxiety disorder, unspecified: Secondary | ICD-10-CM

## 2017-03-13 DIAGNOSIS — F321 Major depressive disorder, single episode, moderate: Secondary | ICD-10-CM

## 2017-03-13 NOTE — Progress Notes (Signed)
[name] Male    DOB: 1951/04/15   66 y.o.   MRN: [id number] Visit Date: 03/13/2017  Today's Provider: Lelon Huh, MD   Chief Complaint  Patient presents with  . Depression   Subjective:    HPI Pt is here today for a 1 month follow up of depression. Effexor was increased to 225 mg daily. He reports that he can not tell much of a difference since the increase of the medication other than he is not having any panic attacks. He still feels like he is depressed. States he remains physically active, but does not do any formal exercise.     No Known Allergies   Current Outpatient Medications:  .  acetaminophen (TYLENOL) 325 MG tablet, Take 2 tablets (650 mg total) by mouth every 6 (six) hours as needed for pain., Disp: 60 tablet, Rfl: 0 .  amLODipine-benazepril (LOTREL) 5-20 MG capsule, Take 1 capsule by mouth daily., Disp: 30 capsule, Rfl: 5 .  fluticasone (FLONASE) 50 MCG/ACT nasal spray, Place 2 sprays into the nose daily., Disp: , Rfl:  .  venlafaxine XR (EFFEXOR XR) 75 MG 24 hr capsule, Take 1 capsule (75 mg total) by mouth daily with breakfast., Disp: 1 capsule, Rfl: 1 .  venlafaxine XR (EFFEXOR-XR) 150 MG 24 hr capsule, Take 1 capsule (150 mg total) by mouth every morning., Disp: 90 capsule, Rfl: 3 .  warfarin (COUMADIN) 5 MG tablet, TAKE 1 OR 2 TABLETS (5 OR 10MG ) DAILY AS DIRECTED BY PHYSICIAN, Disp: 180 tablet, Rfl: 3  Review of Systems  Constitutional: Positive for fatigue. Negative for appetite change, chills and fever.  Respiratory: Negative for chest tightness, shortness of breath and wheezing.   Cardiovascular: Negative for chest pain and palpitations.  Gastrointestinal: Negative for abdominal pain, nausea and vomiting.  Psychiatric/Behavioral: Positive for dysphoric mood. The patient is nervous/anxious.     Social History   Tobacco Use  . Smoking status: Current Some Day Smoker    Packs/day: 0.50    Years: 49.00    Pack years: 24.50   Types: Cigarettes    Last attempt to quit: 06/13/2016    Years since quitting: 0.7  . Smokeless tobacco: Never Used  . Tobacco comment: started smoking at age 62  Substance Use Topics  . Alcohol use: Yes    Alcohol/week: 25.2 oz    Types: 42 Cans of beer per week    Comment: 4-6 beers a day   Objective:   BP 112/64 (BP Location: Left Arm, Patient Position: Sitting, Cuff Size: Large)   Pulse 76   Temp 98.6 F (37 C) (Oral)   Resp 14   Wt 226 lb (102.5 kg)   BMI 31.97 kg/m  Vitals:   03/13/17 1508  BP: 112/64  Pulse: 76  Resp: 14  Temp: 98.6 F (37 C)  TempSrc: Oral  Weight: 226 lb (102.5 kg)     Physical Exam  General appearance: alert, well developed, well nourished, cooperative and in no distress Head: Normocephalic, without obvious abnormality, atraumatic Respiratory: Respirations even and unlabored, normal respiratory rate Extremities: No gross deformities Skin: Skin color, texture, turgor normal. No rashes seen  Psych: Appropriate mood and affect. Neurologic: Mental status: Alert, oriented to person, place, and time, thought content appropriate.     Assessment & Plan:     1. Current moderate episode of major depressive disorder without prior episode (HCC)  - COMPLETE METABOLIC PANEL WITH GFR - CBC - TSH -  Testosterone  2. Anxiety  - COMPLETE METABOLIC PANEL WITH GFR - CBC - TSH - Testosterone  Anxiety improved with increased dose of venlafaxine, but still having significant depression which is likely exacerbated by CVA history. Consider change to duloxetine if labs are normal.   3. Other fatigue  - COMPLETE METABOLIC PANEL WITH GFR - CBC - TSH - Testosterone  4. Colon cancer screening  - Cologuard       Lelon Huh, MD  [facility]

## 2017-03-13 NOTE — Patient Instructions (Signed)
   If all of your blood tests are normal, we will probably need to change to different antidepressant

## 2017-03-14 LAB — CBC
HCT: 37.6 % — ABNORMAL LOW (ref 38.5–50.0)
Hemoglobin: 13.3 g/dL (ref 13.2–17.1)
MCH: 33.2 pg — AB (ref 27.0–33.0)
MCHC: 35.4 g/dL (ref 32.0–36.0)
MCV: 93.8 fL (ref 80.0–100.0)
MPV: 11.5 fL (ref 7.5–12.5)
PLATELETS: 175 10*3/uL (ref 140–400)
RBC: 4.01 10*6/uL — AB (ref 4.20–5.80)
RDW: 13.3 % (ref 11.0–15.0)
WBC: 6.8 10*3/uL (ref 3.8–10.8)

## 2017-03-14 LAB — COMPLETE METABOLIC PANEL WITH GFR
AG RATIO: 1.9 (calc) (ref 1.0–2.5)
ALT: 17 U/L (ref 9–46)
AST: 19 U/L (ref 10–35)
Albumin: 4 g/dL (ref 3.6–5.1)
Alkaline phosphatase (APISO): 44 U/L (ref 40–115)
BUN: 16 mg/dL (ref 7–25)
CALCIUM: 8.8 mg/dL (ref 8.6–10.3)
CO2: 25 mmol/L (ref 20–32)
CREATININE: 1.08 mg/dL (ref 0.70–1.25)
Chloride: 107 mmol/L (ref 98–110)
GFR, EST AFRICAN AMERICAN: 82 mL/min/{1.73_m2} (ref >=60)
GFR, EST NON AFRICAN AMERICAN: 71 mL/min/{1.73_m2} (ref >=60)
GLOBULIN: 2.1 g/dL (ref 1.9–3.7)
Glucose, Bld: 81 mg/dL (ref 65–99)
POTASSIUM: 4 mmol/L (ref 3.5–5.3)
Sodium: 139 mmol/L (ref 135–146)
TOTAL PROTEIN: 6.1 g/dL (ref 6.1–8.1)
Total Bilirubin: 0.5 mg/dL (ref 0.2–1.2)

## 2017-03-14 LAB — TSH: TSH: 2.25 m[IU]/L (ref 0.40–4.50)

## 2017-03-14 LAB — TESTOSTERONE: TESTOSTERONE: 380 ng/dL (ref 250–827)

## 2017-03-16 ENCOUNTER — Telehealth: Payer: Self-pay

## 2017-03-16 MED ORDER — DULOXETINE HCL 60 MG PO CPEP: 60.0000 mg | ORAL_CAPSULE | Freq: Every day | ORAL | 3 refills | Status: DC

## 2017-03-16 NOTE — Telephone Encounter (Signed)
-----   Message from Birdie Sons, MD sent at 03/16/2017  8:21 AM EST ----- Labs all normal. If he wants to try different antidepressant, then can change from venlafaxine to duloxetine 60mg  daily, #30, rf x 3.

## 2017-03-16 NOTE — Telephone Encounter (Signed)
Advised wife as below. Patient is wanting to start new medication. Duloxetine was sent into the pharmacy.

## 2017-03-17 ENCOUNTER — Telehealth: Payer: Self-pay | Admitting: Family Medicine

## 2017-03-17 NOTE — Telephone Encounter (Signed)
Order for cologuard faxed to [facility] °

## 2017-03-25 ENCOUNTER — Telehealth: Payer: Self-pay | Admitting: Family Medicine

## 2017-03-25 MED ORDER — ALPRAZOLAM 0.5 MG PO TABS: 0.2500 mg | ORAL_TABLET | ORAL | 0 refills | Status: DC | PRN

## 2017-03-25 NOTE — Telephone Encounter (Signed)
Please advise? Patient's last ov was 03/13/2017.

## 2017-03-25 NOTE — Telephone Encounter (Signed)
Have sent prescription for alprazolam to pharmacy which he can take prn for a few weeks.

## 2017-03-25 NOTE — Telephone Encounter (Signed)
Pt wife called states pt is having a lot of anxiety.  Pt wife states pt medication was just recently changed.  Pt has  no focus, seems very anxious and his heart was beating fast yesterday.  Pt wife is asking if pt can get something to help until the new medication starts to help.  [facility].  CB#[phone]/MW

## 2017-03-26 NOTE — Telephone Encounter (Signed)
Advised  ED 

## 2017-04-10 ENCOUNTER — Ambulatory Visit (INDEPENDENT_AMBULATORY_CARE_PROVIDER_SITE_OTHER): Payer: Medicare Other

## 2017-04-10 VITALS — BP 146/88 | HR 60 | Temp 98.3°F | Ht 71.0 in | Wt 225.6 lb

## 2017-04-10 DIAGNOSIS — Z0000 Encounter for general adult medical examination without abnormal findings: Secondary | ICD-10-CM | POA: Diagnosis not present

## 2017-04-10 DIAGNOSIS — Z86718 Personal history of other venous thrombosis and embolism: Secondary | ICD-10-CM | POA: Diagnosis not present

## 2017-04-10 LAB — POCT INR
INR: 3.7
Prothrombin Time: 44.5

## 2017-04-10 NOTE — Patient Instructions (Signed)
Description   Dx: DVT  I82.4Z3 Current coumadin dosage: 10mg  QD except 5mg  on Monday and Friday Todays changes: Hold 3 days than resume dose Recheck: 3 weeks

## 2017-04-10 NOTE — Patient Instructions (Addendum)
Chase [name] , Thank you for taking time to come for your Medicare Wellness Visit. I appreciate your ongoing commitment to your health goals. Please review the following plan we discussed and let me know if I can assist you in the future.   Screening recommendations/referrals: Colonoscopy: Pt awaiting cologuard kit to complete at home. Recommended yearly ophthalmology/optometry visit for glaucoma screening and checkup Recommended yearly dental visit for hygiene and checkup  Vaccinations: Influenza vaccine: up to date Pneumococcal vaccine: up to date Tdap vaccine: up to date Shingles vaccine: declined    Advanced directives: Please bring a copy of your POA (Power of Attorney) and/or Living Will to your next appointment.   Conditions/risks identified: Recommend increasing water intake to 6-8 glasses a day. Recommend to cut back on alcohol intake and pt denied changing.  Next appointment: 04/29/17  Preventive Care 65 Years and Older, Male Preventive care refers to lifestyle choices and visits with your health care provider that can promote health and wellness. What does preventive care include?  A yearly physical exam. This is also called an annual well check.  Dental exams once or twice a year.  Routine eye exams. Ask your health care provider how often you should have your eyes checked.  Personal lifestyle choices, including:  Daily care of your teeth and gums.  Regular physical activity.  Eating a healthy diet.  Avoiding tobacco and drug use.  Limiting alcohol use.  Practicing safe sex.  Taking low doses of aspirin every day.  Taking vitamin and mineral supplements as recommended by your health care provider. What happens during an annual well check? The services and screenings done by your health care provider during your annual well check will depend on your age, overall health, lifestyle risk factors, and family history of disease. Counseling  Your health care  provider may ask you questions about your:  Alcohol use.  Tobacco use.  Drug use.  Emotional well-being.  Home and relationship well-being.  Sexual activity.  Eating habits.  History of falls.  Memory and ability to understand (cognition).  Work and work Statistician. Screening  You may have the following tests or measurements:  Height, weight, and BMI.  Blood pressure.  Lipid and cholesterol levels. These may be checked every 5 years, or more frequently if you are over 81 years old.  Skin check.  Lung cancer screening. You may have this screening every year starting at age 28 if you have a 30-pack-year history of smoking and currently smoke or have quit within the past 15 years.  Fecal occult blood test (FOBT) of the stool. You may have this test every year starting at age 47.  Flexible sigmoidoscopy or colonoscopy. You may have a sigmoidoscopy every 5 years or a colonoscopy every 10 years starting at age 2.  Prostate cancer screening. Recommendations will vary depending on your family history and other risks.  Hepatitis C blood test.  Hepatitis B blood test.  Sexually transmitted disease (STD) testing.  Diabetes screening. This is done by checking your blood sugar (glucose) after you have not eaten for a while (fasting). You may have this done every 1-3 years.  Abdominal aortic aneurysm (AAA) screening. You may need this if you are a current or former smoker.  Osteoporosis. You may be screened starting at age 82 if you are at high risk. Talk with your health care provider about your test results, treatment options, and if necessary, the need for more tests. Vaccines  Your health care provider may  recommend certain vaccines, such as:  Influenza vaccine. This is recommended every year.  Tetanus, diphtheria, and acellular pertussis (Tdap, Td) vaccine. You may need a Td booster every 10 years.  Zoster vaccine. You may need this after age 33.  Pneumococcal  13-valent conjugate (PCV13) vaccine. One dose is recommended after age 33.  Pneumococcal polysaccharide (PPSV23) vaccine. One dose is recommended after age 72. Talk to your health care provider about which screenings and vaccines you need and how often you need them. This information is not intended to replace advice given to you by your health care provider. Make sure you discuss any questions you have with your health care provider. Document Released: 05/11/2015 Document Revised: 01/02/2016 Document Reviewed: 02/13/2015 Elsevier Interactive Patient Education  2017 [place] Prevention in the Home Falls can cause injuries. They can happen to people of all ages. There are many things you can do to make your home safe and to help prevent falls. What can I do on the outside of my home?  Regularly fix the edges of walkways and driveways and fix any cracks.  Remove anything that might make you trip as you walk through a door, such as a raised step or threshold.  Trim any bushes or trees on the path to your home.  Use bright outdoor lighting.  Clear any walking paths of anything that might make someone trip, such as rocks or tools.  Regularly check to see if handrails are loose or broken. Make sure that both sides of any steps have handrails.  Any raised decks and porches should have guardrails on the edges.  Have any leaves, snow, or ice cleared regularly.  Use sand or salt on walking paths during winter.  Clean up any spills in your garage right away. This includes oil or grease spills. What can I do in the bathroom?  Use night lights.  Install grab bars by the toilet and in the tub and shower. Do not use towel bars as grab bars.  Use non-skid mats or decals in the tub or shower.  If you need to sit down in the shower, use a plastic, non-slip stool.  Keep the floor dry. Clean up any water that spills on the floor as soon as it happens.  Remove soap buildup in the  tub or shower regularly.  Attach bath mats securely with double-sided non-slip rug tape.  Do not have throw rugs and other things on the floor that can make you trip. What can I do in the bedroom?  Use night lights.  Make sure that you have a light by your bed that is easy to reach.  Do not use any sheets or blankets that are too big for your bed. They should not hang down onto the floor.  Have a firm chair that has side arms. You can use this for support while you get dressed.  Do not have throw rugs and other things on the floor that can make you trip. What can I do in the kitchen?  Clean up any spills right away.  Avoid walking on wet floors.  Keep items that you use a lot in easy-to-reach places.  If you need to reach something above you, use a strong step stool that has a grab bar.  Keep electrical cords out of the way.  Do not use floor polish or wax that makes floors slippery. If you must use wax, use non-skid floor wax.  Do not have throw rugs and  other things on the floor that can make you trip. What can I do with my stairs?  Do not leave any items on the stairs.  Make sure that there are handrails on both sides of the stairs and use them. Fix handrails that are broken or loose. Make sure that handrails are as long as the stairways.  Check any carpeting to make sure that it is firmly attached to the stairs. Fix any carpet that is loose or worn.  Avoid having throw rugs at the top or bottom of the stairs. If you do have throw rugs, attach them to the floor with carpet tape.  Make sure that you have a light switch at the top of the stairs and the bottom of the stairs. If you do not have them, ask someone to add them for you. What else can I do to help prevent falls?  Wear shoes that:  Do not have high heels.  Have rubber bottoms.  Are comfortable and fit you well.  Are closed at the toe. Do not wear sandals.  If you use a stepladder:  Make sure that it  is fully opened. Do not climb a closed stepladder.  Make sure that both sides of the stepladder are locked into place.  Ask someone to hold it for you, if possible.  Clearly mark and make sure that you can see:  Any grab bars or handrails.  First and last steps.  Where the edge of each step is.  Use tools that help you move around (mobility aids) if they are needed. These include:  Canes.  Walkers.  Scooters.  Crutches.  Turn on the lights when you go into a dark area. Replace any light bulbs as soon as they burn out.  Set up your furniture so you have a clear path. Avoid moving your furniture around.  If any of your floors are uneven, fix them.  If there are any pets around you, be aware of where they are.  Review your medicines with your doctor. Some medicines can make you feel dizzy. This can increase your chance of falling. Ask your doctor what other things that you can do to help prevent falls. This information is not intended to replace advice given to you by your health care provider. Make sure you discuss any questions you have with your health care provider. Document Released: 02/08/2009 Document Revised: 09/20/2015 Document Reviewed: 05/19/2014 Elsevier Interactive Patient Education  2017 [employer].

## 2017-04-10 NOTE — Progress Notes (Signed)
Subjective:   Chase [name] is a 66 y.o. male who presents for an Initial Medicare Annual Wellness Visit.  Review of Systems  N/A  Cardiac Risk Factors include: advanced age (>66mn, >>69women);hypertension;male gender;sedentary lifestyle;smoking/ tobacco exposure    Objective:    Today's Vitals   04/10/17 1043 04/10/17 1047  BP: (!) 150/80 (!) 146/88  Pulse: 60   Temp: 98.3 F (36.8 C)   TempSrc: Oral   Weight: 225 lb 9.6 oz (102.3 kg)   Height: _0  (1.803 m)   PainSc: 0-No pain 0-No pain   Body mass index is 31.46 kg/m.  Advanced Directives 04/10/2017 06/30/2012 06/29/2012 01/19/2012  Does Patient Have a Medical Advance Directive? Yes Patient has advance directive, copy not in chart Patient has advance directive, copy not in chart Patient has advance directive, copy not in chart  Type of Advance Directive - Living will Living will Living will;Healthcare Power of AVirginiain Chart? - Copy requested from family Copy requested from family -  Pre-existing out of facility DNR order (yellow form or pink MOST form) - No No No    Current Medications (verified) Outpatient Encounter Medications as of 04/10/2017  Medication Sig  . acetaminophen (TYLENOL) 325 MG tablet Take 2 tablets (650 mg total) by mouth every 6 (six) hours as needed for pain.  .Marland KitchenALPRAZolam (XANAX) 0.5 MG tablet Take 0.5-1 tablets (0.25-0.5 mg total) by mouth every 4 (four) hours as needed.  .Marland KitchenamLODipine-benazepril (LOTREL) 5-20 MG capsule Take 1 capsule by mouth daily.  . DULoxetine (CYMBALTA) 60 MG capsule Take 1 capsule (60 mg total) daily by mouth.  . warfarin (COUMADIN) 5 MG tablet TAKE 1 OR 2 TABLETS (5 OR 10MG) DAILY AS DIRECTED BY PHYSICIAN  . azithromycin (ZITHROMAX) 250 MG tablet Take 250 mg by mouth daily.   . fluticasone (FLONASE) 50 MCG/ACT nasal spray Place 2 sprays into the nose daily.   No facility-administered encounter medications on file as of  04/10/2017.     Allergies (verified) Patient has no known allergies.   History: Past Medical History:  Diagnosis Date  . Cerebro[place] ([place]) [place] [place]  . Clotting disorder ([facility]   . [place]) [place]   . Hearing deficit   . History of ischemic vertebrobasilar artery thalamic stroke 2001  . History of stroke   . Hyperlipidemia   . Hypertension   . Patent foramen ovale   . Right knee DJD   . Stroke ([facility] 2001   Past Surgical History:  Procedure Laterality Date  . APPENDECTOMY    . FRACTURE SURGERY     R leg  . INCISION AND DRAINAGE Right 06/30/2012   Procedure: INCISION AND DRAINAGE;  Surgeon: RLorn Junes MD;  [place] [facility]  Service: Orthopedics;  Laterality: Right;  . KNEE ARTHROSCOPY     right knee  . KNEE BURSECTOMY Right 06/30/2012   Procedure: KNEE BURSECTOMY;  Surgeon: RLorn Junes MD;  [place] [facility]  Service: Orthopedics;  Laterality: Right;  Pre-patellar with wound closure  . TOTAL KNEE ARTHROPLASTY  01/26/2012   Procedure: TOTAL KNEE ARTHROPLASTY;  Surgeon: RLorn Junes MD;  [place] [facility]  Service: Orthopedics;  Laterality: Right;   Family History  Problem Relation Age of Onset  . Cancer Father   . Seizures Brother    Social History   Socioeconomic History  . Marital status: Married    Spouse name: None  . Number  of children: 0  . Years of education: 48  . Highest education level: None  Social Needs  . Financial resource strain: Not hard at all  . Food insecurity - worry: Never true  . Food insecurity - inability: Never true  . Transportation needs - medical: No  . Transportation needs - non-medical: No  Occupational History  . None  Tobacco Use  . Smoking status: Current Some Day Smoker    Packs/day: 1.00    Years: 49.00    Pack years: 49.00    Types: Cigarettes    Last attempt to quit: 06/13/2016    Years since quitting: 0.8  . Smokeless tobacco: Never Used  . Tobacco comment: started smoking  at age 75  Substance and Sexual Activity  . Alcohol use: Yes    Alcohol/week: 25.2 oz    Types: 42 Cans of beer per week    Comment: 4-6 beers a day  . Drug use: No  . Sexual activity: None  Other Topics Concern  . None  Social History Narrative  . None   Tobacco Counseling Ready to quit: No Counseling given: No Comment: started smoking at age 2   Clinical Intake:  Pre-visit preparation completed: Yes  Pain : No/denies pain Pain Score: 0-No pain     Nutritional Status: BMI > 30  Obese Nutritional Risks: None Diabetes: No  How often do you need to have someone help you when you read instructions, pamphlets, or other written materials from your doctor or pharmacy?: 1 - Never What is the last grade level you completed in school?: MS Degree  Interpreter Needed?: No  Information entered by :: [facility], LPN  Activities of Daily Living In your present state of health, do you have any difficulty performing the following activities: 04/10/2017 03/13/2017  Hearing? N N  Vision? N N  Difficulty concentrating or making decisions? Tempie Donning  Walking or climbing stairs? N N  Dressing or bathing? N N  Doing errands, shopping? N N  Preparing Food and eating ? N -  Using the Toilet? N -  In the past six months, have you accidently leaked urine? N -  Do you have problems with loss of bowel control? N -  Managing your Medications? N -  Managing your Finances? N -  Housekeeping or managing your Housekeeping? N -  Some recent data might be hidden     Immunizations and Health Maintenance Immunization History  Administered Date(s) Administered  . Influenza Split 01/27/2012  . Influenza, High Dose Seasonal PF 12/18/2016  . Influenza,inj,Quad PF,6+ Mos 04/06/2015  . Pneumococcal Conjugate-13 12/18/2016  . Pneumococcal Polysaccharide-23 01/27/2012  . Td 11/23/2015  . Tdap 11/10/2005   Health Maintenance Due  Topic Date Due  . COLON CANCER SCREENING ANNUAL FOBT  05/13/2016     Patient Care Team: Birdie Sons, MD as PCP - General (Family Medicine)  Indicate any recent Medical Services you may have received from other than [facility] providers in the past year (date may be approximate).    Assessment:   This is a routine wellness examination for Chase [name].  Hearing/Vision screen Vision Screening Comments: Pt sees an eye doctor for vision checks yearly. Unsure of doctors name.   Dietary issues and exercise activities discussed: Current Exercise Habits: The patient does not participate in regular exercise at present, Exercise limited by: None identified  Goals    . DIET - INCREASE WATER INTAKE     Recommend increasing water intake to 6-8 glasses a  day. Recommend to cut back on alcohol intake and pt denied changing.      Depression Screen PHQ 2/9 Scores 04/10/2017 12/18/2016 07/11/2016  PHQ - 2 Score _0 PHQ- 9 Score _1 Fall Risk Fall Risk  04/10/2017 07/11/2016  Falls in the past year? No No    Is the patient's home free of loose throw rugs in walkways, pet beds, electrical cords, etc?   yes      Grab bars in the bathroom? yes      Handrails on the stairs?   no      Adequate lighting?   yes    Cognitive Function:     6CIT Screen 04/10/2017  What Year? 4 points  What month? 0 points  What time? 0 points  Count back from 20 0 points  Months in reverse 0 points  Repeat phrase 10 points  Total Score 14    Screening Tests Health Maintenance  Topic Date Due  . COLON CANCER SCREENING ANNUAL FOBT  05/13/2016  . PNA vac Low Risk Adult (2 of 2 - PPSV23) 12/18/2017  . TETANUS/TDAP  11/22/2025  . INFLUENZA VACCINE  Completed  . Hepatitis C Screening  Completed    Qualifies for Shingles Vaccine? Yes, pt declined today.  Cancer Screenings: Lung: Low Dose CT Chest recommended if Age 62-80 years, 30 pack-year currently smoking OR have quit w/in 15years. Patient does qualify. Last scan was completed 03/2012. Colorectal: Currently due, pt  awaiting cologuard kit to complete at home.  Additional Screenings:  Hepatitis B/HIV/Syphillis: Pt declined today. Hepatitis C Screening: Completed 10/2011.      Plan:  I have personally reviewed and addressed the Medicare Annual Wellness questionnaire and have noted the following in the patient's chart:  A. Medical and social history B. Use of alcohol, tobacco or illicit drugs  C. Current medications and supplements D. Functional ability and status E.  Nutritional status F.  Physical activity G. Advance directives H. List of other physicians I.  Hospitalizations, surgeries, and ER visits in previous 12 months J.  [place] such as hearing and vision if needed, cognitive and depression L. Referrals and appointments - none  In addition, I have reviewed and discussed with patient certain preventive protocols, quality metrics, and best practice recommendations. A written personalized care plan for preventive services as well as general preventive health recommendations were provided to patient.  See attached scanned questionnaire for additional information.   Signed,  Fabio Neighbors, LPN Nurse Health Advisor   Nurse Recommendations: None. PT awaiting cologuard kit to complete at home.

## 2017-04-13 ENCOUNTER — Other Ambulatory Visit: Payer: Self-pay | Admitting: Family Medicine

## 2017-04-20 ENCOUNTER — Telehealth: Payer: Self-pay | Admitting: Family Medicine

## 2017-04-20 MED ORDER — ALPRAZOLAM 0.5 MG PO TABS: 0.2500 mg | ORAL_TABLET | ORAL | 2 refills | Status: DC | PRN

## 2017-04-20 MED ORDER — DULOXETINE HCL 30 MG PO CPEP: 90.0000 mg | ORAL_CAPSULE | Freq: Every day | ORAL | 2 refills | Status: DC

## 2017-04-20 NOTE — Telephone Encounter (Signed)
Please review

## 2017-04-20 NOTE — Telephone Encounter (Signed)
Pt needs refills on his generic xanax 0.5  Pt states he thinks he needs a higher dose duloxetine 60mg .  He does not think it is working.  pts call back is [phone]  [facility]  Thanks teri

## 2017-04-22 ENCOUNTER — Telehealth: Payer: Self-pay | Admitting: Family Medicine

## 2017-04-22 NOTE — Telephone Encounter (Signed)
Pt called back about his increasing the antidepressant and refill on his generic xanax.  He did not get a call back yet.  His call back is [phone]  Thanks teri

## 2017-04-22 NOTE — Telephone Encounter (Signed)
Spoke with pharmacist and he is certain that it will cover this way. Please provide directions for use, and I will send into the pharmacy. Thanks!

## 2017-04-22 NOTE — Telephone Encounter (Signed)
L/M stating below.  

## 2017-04-22 NOTE — Telephone Encounter (Signed)
Two prescriptions   Duloxetine 30mg  capsules one daily, #30, rf x 2   Duloxetine 60mg  capusels, one daily in addition to 60mg  capsule, #30, rf x 2.

## 2017-04-22 NOTE — Telephone Encounter (Signed)
Please review. Thanks!  

## 2017-04-22 NOTE — Telephone Encounter (Signed)
Pharmacy sent a note saying that insurance will no pay for 3 capsules of duloxetine a day. An alternative treatment is requested. Please review. Thanks!

## 2017-04-22 NOTE — Telephone Encounter (Signed)
Prescriptions were sent on 04-20-2017

## 2017-04-22 NOTE — Telephone Encounter (Signed)
I want him to take 90mg  a day, but they do not make 90mg  capsules, will insurance pay for a 30mg  and a 60mg  capsule each day.

## 2017-04-23 MED ORDER — DULOXETINE HCL 60 MG PO CPEP: 60.0000 mg | ORAL_CAPSULE | Freq: Every day | ORAL | 2 refills | Status: DC

## 2017-04-23 MED ORDER — DULOXETINE HCL 30 MG PO CPEP: 30.0000 mg | ORAL_CAPSULE | Freq: Every day | ORAL | 2 refills | Status: DC

## 2017-04-23 NOTE — Telephone Encounter (Signed)
Rx's sent to pharmacy.  

## 2017-04-23 NOTE — Addendum Note (Signed)
Addended by: Julieta Bellini on: 04/23/2017 08:13 AM   Modules accepted: Orders

## 2017-04-29 ENCOUNTER — Ambulatory Visit: Payer: Self-pay | Admitting: Family Medicine

## 2017-04-29 ENCOUNTER — Telehealth: Payer: Self-pay | Admitting: Family Medicine

## 2017-04-29 DIAGNOSIS — F419 Anxiety disorder, unspecified: Secondary | ICD-10-CM

## 2017-04-29 DIAGNOSIS — F321 Major depressive disorder, single episode, moderate: Secondary | ICD-10-CM

## 2017-04-29 NOTE — Telephone Encounter (Signed)
Does patient need an OV to discuss? He does have a F/U appt scheduled next week. Please advise. Thanks!

## 2017-04-29 NOTE — Telephone Encounter (Signed)
He needs referral to psychiatrist, I don't know what else to do for him.

## 2017-04-29 NOTE — Telephone Encounter (Signed)
Patient is taking Duloxetine 60 mg. But he says this isnt working.

## 2017-04-30 NOTE — Telephone Encounter (Signed)
Patient advised and reluctantly agreed to referral. He states he has to do something, but he doesn't think a psychiatrist will help due to him not having short term memory. Please schedule

## 2017-04-30 NOTE — Addendum Note (Signed)
Addended by: Meyer Cory L on: 04/30/2017 11:21 AM   Modules accepted: Orders

## 2017-04-30 NOTE — Telephone Encounter (Signed)
Please place referral

## 2017-05-01 ENCOUNTER — Encounter: Payer: Self-pay | Admitting: Family Medicine

## 2017-05-01 ENCOUNTER — Ambulatory Visit (INDEPENDENT_AMBULATORY_CARE_PROVIDER_SITE_OTHER): Payer: Medicare Other | Admitting: Family Medicine

## 2017-05-01 VITALS — BP 112/70 | HR 95 | Temp 98.9°F | Resp 16 | Wt 220.0 lb

## 2017-05-01 DIAGNOSIS — Z86718 Personal history of other venous thrombosis and embolism: Secondary | ICD-10-CM

## 2017-05-01 DIAGNOSIS — F321 Major depressive disorder, single episode, moderate: Secondary | ICD-10-CM

## 2017-05-01 LAB — POCT INR
INR: 3.1
PT: 36.7

## 2017-05-01 NOTE — Patient Instructions (Signed)
Description   Dx: DVT (I82.4Z3) Current dose: 10mg  QD except 5mg  on Monday and Friday PT: 36.7 INR: 3.1 Today's Changes: NO CHANGE Recheck: 4 weeks

## 2017-05-01 NOTE — Progress Notes (Signed)
[name] Male    DOB: 04-Oct-1950   67 y.o.   MRN: [id number] Visit Date: 05/01/2017  Today's Provider: Lelon Huh, MD   Chief Complaint  Patient presents with  . Depression   Subjective:    HPI  Current moderate episode of major depressive disorder without prior episode [facility])  Patient was last seen for this problem on 03/13/2017. Changes made during that visit includes changing from Venlafaxine to Duloxetine 60mg  daily. Since last visit, Alprazolam was added for patient to take as needed due to him having a lot of anxiety. On 04/20/2017 Venlafaxine was increased from 60mg  to 90mg  daily. Patient called back to the office on 04/29/2017 stating that the medications wasn't  helping. Patient was advised that he needs referral to Psychiatry. Today patient comes in with his wife reporting that he has only been taking Duloxetine 60mg  daily. He did not increase to Duloxetine to 90mg , apparently due to patient forgetting that additional prescription was called in.  He wife states he has seen psycholoigist Kingsley Callander in [place] years ago after having stroke and is willing to follow up with him if increase in medications is not effective.  No Known Allergies   Current Outpatient Medications:  .  acetaminophen (TYLENOL) 325 MG tablet, Take 2 tablets (650 mg total) by mouth every 6 (six) hours as needed for pain., Disp: 60 tablet, Rfl: 0 .  ALPRAZolam (XANAX) 0.5 MG tablet, Take 0.5-1 tablets (0.25-0.5 mg total) by mouth every 4 (four) hours as needed., Disp: 30 tablet, Rfl: 2 .  amLODipine-benazepril (LOTREL) 5-20 MG capsule, TAKE 1 CAPSULE EVERY DAY, Disp: 90 capsule, Rfl: 4 .  DULoxetine (CYMBALTA) 60 MG capsule, Take 1 capsule (60 mg total) by mouth daily., Disp: 30 capsule, Rfl: 2 .  fluticasone (FLONASE) 50 MCG/ACT nasal spray, Place 2 sprays into the nose daily., Disp: , Rfl:  .  warfarin (COUMADIN) 5 MG tablet, TAKE 1 OR 2 TABLETS (5 OR 10MG ) DAILY AS DIRECTED BY  PHYSICIAN, Disp: 180 tablet, Rfl: 3 .  azithromycin (ZITHROMAX) 250 MG tablet, Take 250 mg by mouth daily. , Disp: , Rfl:  .  DULoxetine (CYMBALTA) 30 MG capsule, Take 1 capsule (30 mg total) by mouth daily. (Patient not taking: Reported on 05/01/2017), Disp: 30 capsule, Rfl: 2  Review of Systems  Constitutional: Negative for appetite change, chills and fever.  Respiratory: Negative for chest tightness, shortness of breath and wheezing.   Cardiovascular: Negative for chest pain and palpitations.  Gastrointestinal: Negative for abdominal pain, nausea and vomiting.    Social History   Tobacco Use  . Smoking status: Current Some Day Smoker    Packs/day: 1.00    Years: 49.00    Pack years: 49.00    Types: Cigarettes    Last attempt to quit: 06/13/2016    Years since quitting: 0.8  . Smokeless tobacco: Never Used  . Tobacco comment: started smoking at age 63  Substance Use Topics  . Alcohol use: Yes    Alcohol/week: 25.2 oz    Types: 42 Cans of beer per week    Comment: 4-6 beers a day   Objective:   BP 112/70 (BP Location: Right Arm, Patient Position: Sitting, Cuff Size: Large)   Pulse 95   Temp 98.9 F (37.2 C) (Oral)   Resp 16   Wt 220 lb (99.8 kg)   SpO2 96% Comment: room air  BMI 30.68 kg/m  There were no vitals filed for  this visit.   Physical Exam  General appearance: alert, well developed, well nourished, cooperative and in no distress Head: Normocephalic, without obvious abnormality, atraumatic Respiratory: Respirations even and unlabored, normal respiratory rate Extremities: No gross deformities Skin: Skin color, texture, turgor normal. No rashes seen  Psych: Appropriate mood and affect. Neurologic: Mental status: Alert, oriented to person, place, and time, thought content appropriate.  Results for orders placed or performed in visit on 05/01/17  POCT INR  Result Value Ref Range   INR 3.1    PT 36.7        Assessment & Plan:      1. History of DVT in  adulthood Continue current dose warfarin and recheck in 4 weeks.  - POCT INR  2. Current moderate episode of major depressive disorder without prior episode ([place]) He is to pick up prescription for 30mg  duloxetine to take in addition to 60mg  capsules. He will consider following up with psychologist he saw in past but advised may need to see psychiatrist for medication management.        Lelon Huh, MD  [facility]

## 2017-05-08 ENCOUNTER — Ambulatory Visit: Payer: Self-pay

## 2017-05-16 ENCOUNTER — Other Ambulatory Visit: Payer: Self-pay | Admitting: Family Medicine

## 2017-05-16 NOTE — Telephone Encounter (Signed)
Patient is requesting a refill on the following medication  ALPRAZolam (XANAX) 0.5 MG tablet  He uses [facility]

## 2017-05-18 MED ORDER — ALPRAZOLAM 0.5 MG PO TABS: 0.2500 mg | ORAL_TABLET | ORAL | 3 refills | Status: DC | PRN

## 2017-05-20 ENCOUNTER — Telehealth: Payer: Self-pay | Admitting: Family Medicine

## 2017-05-20 DIAGNOSIS — F321 Major depressive disorder, single episode, moderate: Secondary | ICD-10-CM

## 2017-05-20 NOTE — Telephone Encounter (Signed)
Chase [name]'s wife, Chase [name], called to say that she had talked to [facility] therapist Dr. Wilhemina Bonito and he is not able to help regulate Chase [name]'s medicines.    So Chase [name] would like for Chase [name] to be referred to a psychiatrist.

## 2017-05-21 ENCOUNTER — Telehealth: Payer: Self-pay

## 2017-05-21 NOTE — Telephone Encounter (Signed)
Patient reports Duloxetine 90 mg is not helping his depression. Patient reports chest pain, shortness of breath and hands shaking. Patient denies any suicidal thoughts, just feeling down, no energy and can't concentrate.  Patient reports taking Xanax 1 tablet in the morning, 4 hours later 1/2 tablet and 1 tablet of Xanax at bed time. Patient reports waking up after 5-6 hours when Xanax wears off. Patient reports he has not tried taking Xanax 1 tablet every 4 hours as needed. Please advise. sd

## 2017-05-22 MED ORDER — CLONAZEPAM 0.5 MG PO TABS: ORAL_TABLET | ORAL | 0 refills | Status: DC

## 2017-05-22 NOTE — Telephone Encounter (Signed)
Clonazepam is basically the same thing as alprazolam except it is longer lasting, usually last 8-12 hours, so it should not wear off being doses. Might take 1-2 days for his system to adjust to change in medication  We will have to get in touch with [facility] rep about the test she is asking about unless she know the name or number of the test. i'm not familiar with it.   The only reason I suggested reducing Cymbalta is because he's not doing any better on the higher the dose.

## 2017-05-22 NOTE — Telephone Encounter (Signed)
Patient was advised. Rx for clonazepam called into pharmacy.

## 2017-05-22 NOTE — Telephone Encounter (Signed)
Can change alprazolam to clonazepam 0.5mg  one tablet every eight hours., #90, rf x 0. Until he can get in with psychiatrist. Can reduce cymbalta back down to 60mg  a day since the 90mg  is not helping.

## 2017-05-22 NOTE — Telephone Encounter (Signed)
Patient wants to know how long clonazepam takes to start working. Also wants to know what the difference is between alprazolam and clonazepam? Patient wanted clarification on the reduction of Cymbalta, when the higher dose did not work?  Patient also stated that he has heard of a test through [facility] that determines which depression medication works best for an individual. He is interested in getting this lab test done. Please advise?

## 2017-05-29 ENCOUNTER — Telehealth: Payer: Self-pay | Admitting: Family Medicine

## 2017-05-29 ENCOUNTER — Ambulatory Visit (INDEPENDENT_AMBULATORY_CARE_PROVIDER_SITE_OTHER): Payer: Medicare Other

## 2017-05-29 DIAGNOSIS — Z86718 Personal history of other venous thrombosis and embolism: Secondary | ICD-10-CM

## 2017-05-29 LAB — POCT INR
INR: 2.7
PT: 32.9

## 2017-05-29 NOTE — Patient Instructions (Signed)
Description   Dx: DVT (I82.4Z3) Current coumadin dose: 10mg  QD, except 5mg  on Monday and Friday PT: 32.9 INR: 2.7 Today's Changes: NO CHANGE Recheck: 4 weeks

## 2017-05-29 NOTE — Telephone Encounter (Signed)
Patient's wife called in to say that  Chase [name] has an appt on 06/09/17 with [facility].  Not sure of doctor he is going to see.

## 2017-05-29 NOTE — Telephone Encounter (Signed)
FYI

## 2017-06-09 ENCOUNTER — Ambulatory Visit (INDEPENDENT_AMBULATORY_CARE_PROVIDER_SITE_OTHER): Payer: Medicare Other | Admitting: Psychiatry

## 2017-06-09 ENCOUNTER — Encounter: Payer: Self-pay | Admitting: Psychiatry

## 2017-06-09 ENCOUNTER — Other Ambulatory Visit: Payer: Self-pay

## 2017-06-09 VITALS — BP 137/79 | HR 81 | Temp 98.2°F | Wt 223.2 lb

## 2017-06-09 DIAGNOSIS — F0631 Mood disorder due to known physiological condition with depressive features: Secondary | ICD-10-CM | POA: Diagnosis not present

## 2017-06-09 DIAGNOSIS — F410 Panic disorder [episodic paroxysmal anxiety] without agoraphobia: Secondary | ICD-10-CM

## 2017-06-09 DIAGNOSIS — F1010 Alcohol abuse, uncomplicated: Secondary | ICD-10-CM | POA: Diagnosis not present

## 2017-06-09 DIAGNOSIS — F09 Unspecified mental disorder due to known physiological condition: Secondary | ICD-10-CM | POA: Diagnosis not present

## 2017-06-09 MED ORDER — BUSPIRONE HCL 5 MG PO TABS: 5.0000 mg | ORAL_TABLET | Freq: Two times a day (BID) | ORAL | 1 refills | Status: DC
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
## 2017-06-09 NOTE — Patient Instructions (Signed)
Alcohol Use Disorder Alcohol use disorder is when your drinking disrupts your daily life. When you have this condition, you drink too much alcohol and you cannot control your drinking. Alcohol use disorder can cause serious problems with your physical health. It can affect your brain, heart, liver, pancreas, immune system, stomach, and intestines. Alcohol use disorder can increase your risk for certain cancers and cause problems with your mental health, such as depression, anxiety, psychosis, delirium, and dementia. People with this disorder risk hurting themselves and others. What are the causes? This condition is caused by drinking too much alcohol over time. It is not caused by drinking too much alcohol only one or two times. Some people with this condition drink alcohol to cope with or escape from negative life events. Others drink to relieve pain or symptoms of mental illness. What increases the risk? You are more likely to develop this condition if:  You have a family history of alcohol use disorder.  Your culture encourages drinking to the point of intoxication, or makes alcohol easy to get.  You had a mood or conduct disorder in childhood.  You have been a victim of abuse.  You are an adolescent and: ? You have poor grades or difficulties in school. ? Your caregivers do not talk to you about saying no to alcohol, or supervise your activities. ? You are impulsive or you have trouble with self-control.  What are the signs or symptoms? Symptoms of this condition include:  Drinkingmore than you want to.  Drinking for longer than you want to.  Trying several times to drink less or to control your drinking.  Spending a lot of time getting alcohol, drinking, or recovering from drinking.  Craving alcohol.  Having problems at work, at school, or at home due to drinking.  Having problems in relationships due to drinking.  Drinking when it is dangerous to drink, such as before  driving a car.  Continuing to drink even though you know you might have a physical or mental problem related to drinking.  Needing more and more alcohol to get the same effect you want from the alcohol (building up tolerance).  Having symptoms of withdrawal when you stop drinking. Symptoms of withdrawal include: ? Fatigue. ? Nightmares. ? Trouble sleeping. ? Depression. ? Anxiety. ? Fever. ? Seizures. ? Severe confusion. ? Feeling or seeing things that are not there (hallucinations). ? Tremors. ? Rapid heart rate. ? Rapid breathing. ? High blood pressure.  Drinking to avoid symptoms of withdrawal.  How is this diagnosed? This condition is diagnosed with an assessment. Your health care provider may start the assessment by asking three or four questions about your drinking. Your health care provider may perform a physical exam or do lab tests to see if you have physical problems resulting from alcohol use. She or he may refer you to a mental health professional for evaluation. How is this treated? Some people with alcohol use disorder are able to reduce their alcohol use to low-risk levels. Others need to completely quit drinking alcohol. When necessary, mental health professionals with specialized training in substance use treatment can help. Your health care provider can help you decide how severe your alcohol use disorder is and what type of treatment you need. The following forms of treatment are available:  Detoxification. Detoxification involves quitting drinking and using prescription medicines within the first week to help lessen withdrawal symptoms. This treatment is important for people who have had withdrawal symptoms before and for   heavy drinkers who are likely to have withdrawal symptoms. Alcohol withdrawal can be dangerous, and in severe cases, it can cause death. Detoxification may be provided in a home, community, or primary care setting, or in a hospital or substance use  treatment facility.  Counseling. This treatment is also called talk therapy. It is provided by substance use treatment counselors. A counselor can address the reasons you use alcohol and suggest ways to keep you from drinking again or to prevent problem drinking. The goals of talk therapy are to: ? Find healthy activities and ways for you to cope with stress. ? Identify and avoid the things that trigger your alcohol use. ? Help you learn how to handle cravings.  Medicines.Medicines can help treat alcohol use disorder by: ? Decreasing alcohol cravings. ? Decreasing the positive feeling you have when you drink alcohol. ? Causing an uncomfortable physical reaction when you drink alcohol (aversion therapy).  Support groups. Support groups are led by people who have quit drinking. They provide emotional support, advice, and guidance.  These forms of treatment are often combined. Some people with this condition benefit from a combination of treatments provided by specialized substance use treatment centers. Follow these instructions at home:  Take over-the-counter and prescription medicines only as told by your health care provider.  Check with your health care provider before starting any new medicines.  Ask friends and family members not to offer you alcohol.  Avoid situations where alcohol is served, including gatherings where others are drinking alcohol.  Create a plan for what to do when you are tempted to use alcohol.  Find hobbies or activities that you enjoy that do not include alcohol.  Keep all follow-up visits as told by your health care provider. This is important. How is this prevented?  If you drink, limit alcohol intake to no more than 1 drink a day for nonpregnant women and 2 drinks a day for men. One drink equals 12 oz of beer, 5 oz of wine, or 1 oz of hard liquor.  If you have a mental health condition, get treatment and support.  Do not give alcohol to  adolescents.  If you are an adolescent: ? Do not drink alcohol. ? Do not be afraid to say no if someone offers you alcohol. Speak up about why you do not want to drink. You can be a positive role model for your friends and set a good example for those around you by not drinking alcohol. ? If your friends drink, spend time with others who do not drink alcohol. Make new friends who do not use alcohol. ? Find healthy ways to manage stress and emotions, such as meditation or deep breathing, exercise, spending time in nature, listening to music, or talking with a trusted friend or family member. Contact a health care provider if:  You are not able to take your medicines as told.  Your symptoms get worse.  You return to drinking alcohol (relapse) and your symptoms get worse. Get help right away if:  You have thoughts about hurting yourself or others. If you ever feel like you may hurt yourself or others, or have thoughts about taking your own life, get help right away. You can go to your nearest emergency department or call:  Your local emergency services (911 in the [place]  A suicide crisis helpline, such as the National Suicide Prevention Lifeline at 1-800-273-8255. This is open 24 hours a day.  Summary  Alcohol use disorder is when your   drinking disrupts your daily life. When you have this condition, you drink too much alcohol and you cannot control your drinking.  Treatment may include detoxification, counseling, medicine, and support groups.  Ask friends and family members not to offer you alcohol. Avoid situations where alcohol is served.  Get help right away if you have thoughts about hurting yourself or others. This information is not intended to replace advice given to you by your health care provider. Make sure you discuss any questions you have with your health care provider. Document Released: 05/22/2004 Document Revised: 01/10/2016 Document Reviewed: 01/10/2016 Elsevier  Interactive Patient Education  2018 [employer]. Buspirone tablets What is this medicine? BUSPIRONE (byoo SPYE rone) is used to treat anxiety disorders. This medicine may be used for other purposes; ask your health care provider or pharmacist if you have questions. COMMON BRAND NAME(S): BuSpar What should I tell my health care provider before I take this medicine? They need to know if you have any of these conditions: -kidney or liver disease -an unusual or allergic reaction to buspirone, other medicines, foods, dyes, or preservatives -pregnant or trying to get pregnant -breast-feeding How should I use this medicine? Take this medicine by mouth with a glass of water. Follow the directions on the prescription label. You may take this medicine with or without food. To ensure that this medicine always works the same way for you, you should take it either always with or always without food. Take your doses at regular intervals. Do not take your medicine more often than directed. Do not stop taking except on the advice of your doctor or health care professional. Talk to your pediatrician regarding the use of this medicine in children. Special care may be needed. Overdosage: If you think you have taken too much of this medicine contact a poison control center or emergency room at once. NOTE: This medicine is only for you. Do not share this medicine with others. What if I miss a dose? If you miss a dose, take it as soon as you can. If it is almost time for your next dose, take only that dose. Do not take double or extra doses. What may interact with this medicine? Do not take this medicine with any of the following medications: -linezolid -MAOIs like Carbex, Eldepryl, Marplan, Nardil, and Parnate -methylene blue -procarbazine This medicine may also interact with the following medications: -diazepam -digoxin -diltiazem -erythromycin -grapefruit juice -haloperidol -medicines for mental  depression or mood problems -medicines for seizures like carbamazepine, phenobarbital and phenytoin -nefazodone -other medications for anxiety -rifampin -ritonavir -some antifungal medicines like itraconazole, ketoconazole, and voriconazole -verapamil -warfarin This list may not describe all possible interactions. Give your health care provider a list of all the medicines, herbs, non-prescription drugs, or dietary supplements you use. Also tell them if you smoke, drink alcohol, or use illegal drugs. Some items may interact with your medicine. What should I watch for while using this medicine? Visit your doctor or health care professional for regular checks on your progress. It may take 1 to 2 weeks before your anxiety gets better. You may get drowsy or dizzy. Do not drive, use machinery, or do anything that needs mental alertness until you know how this drug affects you. Do not stand or sit up quickly, especially if you are an older patient. This reduces the risk of dizzy or fainting spells. Alcohol can make you more drowsy and dizzy. Avoid alcoholic drinks. What side effects may I notice from receiving this medicine?  Side effects that you should report to your doctor or health care professional as soon as possible: -blurred vision or other vision changes -chest pain -confusion -difficulty breathing -feelings of hostility or anger -muscle aches and pains -numbness or tingling in hands or feet -ringing in the ears -skin rash and itching -vomiting -weakness Side effects that usually do not require medical attention (report to your doctor or health care professional if they continue or are bothersome): -disturbed dreams, nightmares -headache -nausea -restlessness or nervousness -sore throat and nasal congestion -stomach upset This list may not describe all possible side effects. Call your doctor for medical advice about side effects. You may report side effects to FDA at  1-800-FDA-1088. Where should I keep my medicine? Keep out of the reach of children. Store at room temperature below 30 degrees C (86 degrees F). Protect from light. Keep container tightly closed. Throw away any unused medicine after the expiration date. NOTE: This sheet is a summary. It may not cover all possible information. If you have questions about this medicine, talk to your doctor, pharmacist, or health care provider.  2018 Elsevier/Gold Standard (2009-11-22 18:06:11)

## 2017-06-10 ENCOUNTER — Encounter: Payer: Self-pay | Admitting: Psychiatry

## 2017-06-26 ENCOUNTER — Other Ambulatory Visit: Payer: Self-pay | Admitting: Family Medicine

## 2017-06-26 ENCOUNTER — Ambulatory Visit (INDEPENDENT_AMBULATORY_CARE_PROVIDER_SITE_OTHER): Payer: Medicare Other

## 2017-06-26 DIAGNOSIS — Z86718 Personal history of other venous thrombosis and embolism: Secondary | ICD-10-CM

## 2017-06-26 LAB — POCT INR
INR: 3.3
PT: 40.1

## 2017-06-26 MED ORDER — SCOPOLAMINE 1 MG/3DAYS TD PT72: 1.0000 | MEDICATED_PATCH | TRANSDERMAL | 0 refills | Status: AC | PRN

## 2017-06-26 NOTE — Telephone Encounter (Signed)
Please review

## 2017-06-26 NOTE — Patient Instructions (Signed)
Description   Dx: DVT (I82.4Z3) Current coumadin dose: 10mg  QD, except 5mg  on Monday and Friday PT: 40.1 INR: 3.3 Today's Changes: Hold for 2 days, then resume taking same dose Recheck: 3 weeks

## 2017-06-26 NOTE — Telephone Encounter (Signed)
Patient needs a refill on Transderm scope patches.   #4,  He is going on a cruise.   Called to [facility] in [place]

## 2017-07-01 ENCOUNTER — Ambulatory Visit (INDEPENDENT_AMBULATORY_CARE_PROVIDER_SITE_OTHER): Payer: Medicare Other | Admitting: Family Medicine

## 2017-07-01 ENCOUNTER — Encounter: Payer: Self-pay | Admitting: Family Medicine

## 2017-07-01 VITALS — BP 108/64 | HR 49 | Temp 98.7°F | Resp 18 | Wt 224.0 lb

## 2017-07-01 DIAGNOSIS — R05 Cough: Secondary | ICD-10-CM | POA: Diagnosis not present

## 2017-07-01 DIAGNOSIS — R509 Fever, unspecified: Secondary | ICD-10-CM

## 2017-07-01 DIAGNOSIS — J101 Influenza due to other identified influenza virus with other respiratory manifestations: Secondary | ICD-10-CM

## 2017-07-01 DIAGNOSIS — J40 Bronchitis, not specified as acute or chronic: Secondary | ICD-10-CM | POA: Diagnosis not present

## 2017-07-01 DIAGNOSIS — R059 Cough, unspecified: Secondary | ICD-10-CM

## 2017-07-01 LAB — POCT INFLUENZA A/B
INFLUENZA A, POC: POSITIVE — AB
INFLUENZA B, POC: NEGATIVE

## 2017-07-01 MED ORDER — DOXYCYCLINE HYCLATE 100 MG PO TABS: 100.0000 mg | ORAL_TABLET | Freq: Two times a day (BID) | ORAL | 0 refills | Status: DC

## 2017-07-01 MED ORDER — DULOXETINE HCL 60 MG PO CPEP: 60.0000 mg | ORAL_CAPSULE | Freq: Every day | ORAL | 2 refills | Status: DC

## 2017-07-01 MED ORDER — OSELTAMIVIR PHOSPHATE 75 MG PO CAPS: 75.0000 mg | ORAL_CAPSULE | Freq: Two times a day (BID) | ORAL | 0 refills | Status: AC

## 2017-07-01 NOTE — Progress Notes (Signed)
[name] Male    DOB: 03/14/1951   67 y.o.   MRN: [id number] Visit Date: 07/01/2017  Today's Provider: Lelon Huh, MD   Chief Complaint  Patient presents with  . Cough    x 2 days   Subjective:    Cough  This is a new problem. Episode onset: 2 days ago. The problem has been unchanged. The cough is non-productive. Associated symptoms include chills, a fever, myalgias, nasal congestion, postnasal drip, rhinorrhea and shortness of breath. Pertinent negatives include no chest pain, ear congestion, ear pain, headaches, hemoptysis, sore throat or wheezing. Treatments tried: Mucinex. The treatment provided no relief.       No Known Allergies   Current Outpatient Medications:  .  acetaminophen (TYLENOL) 325 MG tablet, Take 2 tablets (650 mg total) by mouth every 6 (six) hours as needed for pain., Disp: 60 tablet, Rfl: 0 .  amLODipine-benazepril (LOTREL) 5-20 MG capsule, TAKE 1 CAPSULE EVERY DAY, Disp: 90 capsule, Rfl: 4 .  amoxicillin (AMOXIL) 500 MG capsule, TAKE 4 CAPSULES BY MOUTH 1 HOUR BEFORE PROCEDURE, Disp: , Rfl: 12 .  busPIRone (BUSPAR) 5 MG tablet, Take 1 tablet (5 mg total) by mouth 2 (two) times daily., Disp: 60 tablet, Rfl: 1 .  clonazePAM (KLONOPIN) 0.5 MG tablet, Take 1 tablet by mouth every 8 hours (Patient taking differently: Take 0.5 mg by mouth at bedtime. Take 1 tablet by mouth every 8 hours), Disp: 90 tablet, Rfl: 0 .  DULoxetine (CYMBALTA) 60 MG capsule, Take 1 capsule (60 mg total) by mouth daily., Disp: 30 capsule, Rfl: 2 .  scopolamine (TRANSDERM-SCOP, 1.5 MG,) 1 MG/3DAYS, Place 1 patch (1.5 mg total) onto the skin every 3 (three) days as needed for up to 12 days., Disp: 4 patch, Rfl: 0 .  warfarin (COUMADIN) 5 MG tablet, TAKE 1 OR 2 TABLETS (5 OR 10MG ) DAILY AS DIRECTED BY PHYSICIAN, Disp: 180 tablet, Rfl: 3  Review of Systems  Constitutional: Positive for chills, diaphoresis, fatigue and fever. Negative for appetite change.  HENT:  Positive for congestion, postnasal drip and rhinorrhea. Negative for ear pain and sore throat.   Respiratory: Positive for cough and shortness of breath. Negative for hemoptysis, chest tightness and wheezing.   Cardiovascular: Negative for chest pain and palpitations.  Gastrointestinal: Negative for abdominal pain, nausea and vomiting.  Musculoskeletal: Positive for myalgias.  Neurological: Negative for headaches.    Social History   Tobacco Use  . Smoking status: Current Some Day Smoker    Packs/day: 1.00    Years: 49.00    Pack years: 49.00    Types: Cigarettes    Last attempt to quit: 06/13/2016    Years since quitting: 1.0  . Smokeless tobacco: Never Used  . Tobacco comment: started smoking at age 59  Substance Use Topics  . Alcohol use: Yes    Alcohol/week: 25.2 oz    Types: 42 Cans of beer per week    Comment: 4 beers a day   Objective:   BP 108/64 (BP Location: Left Arm, Patient Position: Sitting, Cuff Size: Large)   Pulse (!) 49   Temp 98.7 F (37.1 C) (Oral)   Resp 18   Wt 224 lb (101.6 kg)   SpO2 99%   BMI 31.24 kg/m  There were no vitals filed for this visit.   Physical Exam  General Appearance:    Alert, cooperative, no distress  HENT:   ENT exam normal, no neck nodes  or sinus tenderness  Eyes:    PERRL, conjunctiva/corneas clear, EOM's intact       Lungs:     Occasional expiratory wheeze, no rales, , respirations unlabored  Heart:    Regular rate and rhythm  Neurologic:   Awake, alert, oriented x 3. No apparent focal neurological           defect.       Results for orders placed or performed in visit on 07/01/17  POCT Influenza A/B  Result Value Ref Range   Influenza A, POC Positive (A) Negative   Influenza B, POC Negative Negative        Assessment & Plan:     1. Cough with fever  - POCT Influenza A/B  2. Influenza A  - oseltamivir (TAMIFLU) 75 MG capsule; Take 1 capsule (75 mg total) by mouth 2 (two) times daily for 5 days.  Dispense:  10 capsule; Refill: 0  3. Bronchitis  - doxycycline (VIBRA-TABS) 100 MG tablet; Take 1 tablet (100 mg total) by mouth 2 (two) times daily.  Dispense: 20 tablet; Refill: 0       Lelon Huh, MD  [facility]

## 2017-07-01 NOTE — Patient Instructions (Signed)

## 2017-07-06 ENCOUNTER — Other Ambulatory Visit: Payer: Self-pay | Admitting: Family Medicine

## 2017-07-07 ENCOUNTER — Ambulatory Visit (INDEPENDENT_AMBULATORY_CARE_PROVIDER_SITE_OTHER): Payer: Medicare Other | Admitting: Psychiatry

## 2017-07-07 ENCOUNTER — Other Ambulatory Visit: Payer: Self-pay

## 2017-07-07 ENCOUNTER — Encounter: Payer: Self-pay | Admitting: Psychiatry

## 2017-07-07 VITALS — BP 129/79 | HR 82 | Temp 98.4°F | Wt 217.8 lb

## 2017-07-07 DIAGNOSIS — F0631 Mood disorder due to known physiological condition with depressive features: Secondary | ICD-10-CM

## 2017-07-07 DIAGNOSIS — F1020 Alcohol dependence, uncomplicated: Secondary | ICD-10-CM | POA: Diagnosis not present

## 2017-07-07 DIAGNOSIS — F09 Unspecified mental disorder due to known physiological condition: Secondary | ICD-10-CM | POA: Diagnosis not present

## 2017-07-07 DIAGNOSIS — F410 Panic disorder [episodic paroxysmal anxiety] without agoraphobia: Secondary | ICD-10-CM | POA: Diagnosis not present

## 2017-07-07 MED ORDER — CLONAZEPAM 0.5 MG PO TABS: 0.2500 mg | ORAL_TABLET | Freq: Every day | ORAL | 1 refills | Status: DC

## 2017-07-07 MED ORDER — CLONAZEPAM 0.5 MG PO TABS: 0.2500 mg | ORAL_TABLET | ORAL | 1 refills | Status: DC

## 2017-07-07 MED ORDER — BUSPIRONE HCL 5 MG PO TABS: 5.0000 mg | ORAL_TABLET | Freq: Two times a day (BID) | ORAL | 1 refills | Status: DC

## 2017-07-07 NOTE — Patient Instructions (Signed)
Start taking Klonopin 0.5 mg daily Monday,tuesday,wednesday,thursday,friday and Saturday for two weeks. Take only 0.25 mg Klonopin on sundays for two weeks.  After two weeks take Klonopin 0.5 mg po daily on monday, tuesday, thursdays,friday,saturday. Take only 0.25 mg on wednesday and sunday.  While you do this you can also try skipping your dose anyday in between to see if you are OK with it. If you have increased anxiety symptoms while you do that , please go ahead and take your dose .
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
## 2017-07-08 ENCOUNTER — Encounter: Payer: Self-pay | Admitting: Psychiatry

## 2017-07-17 ENCOUNTER — Ambulatory Visit: Payer: Self-pay

## 2017-07-24 ENCOUNTER — Ambulatory Visit (INDEPENDENT_AMBULATORY_CARE_PROVIDER_SITE_OTHER): Payer: Medicare Other

## 2017-07-24 DIAGNOSIS — Z86718 Personal history of other venous thrombosis and embolism: Secondary | ICD-10-CM | POA: Diagnosis not present

## 2017-07-24 LAB — POCT INR
INR: 5.6
PT: 66.7

## 2017-07-24 NOTE — Patient Instructions (Signed)
Description   Hold for 3 days then 5 mg daily except 10 mg on M, W, F. F/u 3 weeks

## 2017-07-27 ENCOUNTER — Ambulatory Visit (INDEPENDENT_AMBULATORY_CARE_PROVIDER_SITE_OTHER): Payer: Medicare Other | Admitting: Family Medicine

## 2017-07-27 ENCOUNTER — Encounter: Payer: Self-pay | Admitting: Family Medicine

## 2017-07-27 VITALS — BP 140/80 | HR 80 | Temp 98.1°F | Resp 16 | Wt 220.0 lb

## 2017-07-27 DIAGNOSIS — J301 Allergic rhinitis due to pollen: Secondary | ICD-10-CM | POA: Diagnosis not present

## 2017-07-27 DIAGNOSIS — R05 Cough: Secondary | ICD-10-CM

## 2017-07-27 DIAGNOSIS — R059 Cough, unspecified: Secondary | ICD-10-CM

## 2017-07-27 DIAGNOSIS — J309 Allergic rhinitis, unspecified: Secondary | ICD-10-CM | POA: Insufficient documentation

## 2017-07-27 MED ORDER — PREDNISONE 20 MG PO TABS: 20.0000 mg | ORAL_TABLET | Freq: Two times a day (BID) | ORAL | 0 refills | Status: AC

## 2017-07-27 MED ORDER — MONTELUKAST SODIUM 10 MG PO TABS: 10.0000 mg | ORAL_TABLET | Freq: Every day | ORAL | 3 refills | Status: DC

## 2017-07-27 NOTE — Progress Notes (Signed)
[name] Male    DOB: 1951-04-11   67 y.o.   MRN: [id number] Visit Date: 07/27/2017  Today's Provider: Lelon Huh, MD   Chief Complaint  Patient presents with  . Cough   Subjective:    HPI  Bronchitis  From 07/01/2017-seen for cough, flu. And bronchitis. Patient was given doxycycline and TAMIFLU. States cough had resolved for several days when he finished medications, but has since come back and is worse than it was before.   Patient completed antibiotic but still has productive cough. Patient states cough states he has coughed so hard that he thinks he may have a hernia on left side of abdomen. He states he has been having lot of clear nasal drainage and some nasal congestion.    No Known Allergies   Current Outpatient Medications:  .  acetaminophen (TYLENOL) 325 MG tablet, Take 2 tablets (650 mg total) by mouth every 6 (six) hours as needed for pain., Disp: 60 tablet, Rfl: 0 .  amLODipine-benazepril (LOTREL) 5-20 MG capsule, TAKE 1 CAPSULE EVERY DAY, Disp: 90 capsule, Rfl: 4 .  busPIRone (BUSPAR) 5 MG tablet, Take 1 tablet (5 mg total) by mouth 2 (two) times daily., Disp: 60 tablet, Rfl: 1 .  clonazePAM (KLONOPIN) 0.5 MG tablet, Take 0.5-1 tablets (0.25-0.5 mg total) by mouth daily. As directed, Disp: 25 tablet, Rfl: 1 .  DULoxetine (CYMBALTA) 60 MG capsule, Take 1 capsule (60 mg total) by mouth daily., Disp: 30 capsule, Rfl: 2 .  warfarin (COUMADIN) 5 MG tablet, TAKE 1 OR 2 TABLETS (5 OR 10MG ) DAILY AS DIRECTED BY PHYSICIAN, Disp: 180 tablet, Rfl: 3 .  doxycycline (VIBRA-TABS) 100 MG tablet, Take 1 tablet (100 mg total) by mouth 2 (two) times daily. (Patient not taking: Reported on 07/27/2017), Disp: 20 tablet, Rfl: 0  Review of Systems  Constitutional: Negative for appetite change, chills and fever.  Respiratory: Positive for cough. Negative for chest tightness, shortness of breath and wheezing.   Cardiovascular: Negative for chest pain and palpitations.    Gastrointestinal: Negative for abdominal pain, nausea and vomiting.    Social History   Tobacco Use  . Smoking status: Current Some Day Smoker    Packs/day: 1.00    Years: 49.00    Pack years: 49.00    Types: Cigarettes    Last attempt to quit: 06/13/2016    Years since quitting: 1.1  . Smokeless tobacco: Never Used  . Tobacco comment: started smoking at age 64  Substance Use Topics  . Alcohol use: Yes    Alcohol/week: 25.2 oz    Types: 42 Cans of beer per week    Comment: 4 beers a day   Objective:   BP 140/80 (BP Location: Right Arm, Patient Position: Sitting, Cuff Size: Large)   Pulse 80   Temp 98.1 F (36.7 C) (Oral)   Resp 16   Wt 220 lb (99.8 kg)   SpO2 98%   BMI 30.68 kg/m  Vitals:   07/27/17 1122  BP: 140/80  Pulse: 80  Resp: 16  Temp: 98.1 F (36.7 C)  TempSrc: Oral  SpO2: 98%  Weight: 220 lb (99.8 kg)     Physical Exam  General Appearance:    Alert, cooperative, no distress  HENT:   bilateral TM normal without fluid or infection, neck without nodes, throat normal without erythema or exudate, sinuses nontender, post nasal drip noted and nasal mucosa pale and congested  Eyes:    PERRL,  conjunctiva/corneas clear, EOM's intact       Lungs:     Clear to auscultation bilaterally, respirations unlabored  Heart:    Regular rate and rhythm  Neurologic:   Awake, alert, oriented x 3. No apparent focal neurological           defect.           Assessment & Plan:      1. Cough Likely secondary to post nasal drainage and allergies. Maybe aggravated by COPD. Start prednisone and Singulair as below.    2. Allergic rhinitis due to pollen, unspecified seasonality  - predniSONE (DELTASONE) 20 MG tablet; Take 1 tablet (20 mg total) by mouth 2 (two) times daily with a meal for 5 days.  Dispense: 10 tablet; Refill: 0 - montelukast (SINGULAIR) 10 MG tablet; Take 1 tablet (10 mg total) by mouth daily. For cough, nasal drainage and allergies  Dispense: 30 tablet;  Refill: 3  Call if symptoms change or if not rapidly improving.           Lelon Huh, MD  [facility]

## 2017-07-30 NOTE — Telephone Encounter (Signed)
complete

## 2017-08-10 ENCOUNTER — Ambulatory Visit (INDEPENDENT_AMBULATORY_CARE_PROVIDER_SITE_OTHER): Payer: Medicare Other | Admitting: Psychiatry

## 2017-08-10 ENCOUNTER — Other Ambulatory Visit: Payer: Self-pay

## 2017-08-10 ENCOUNTER — Encounter: Payer: Self-pay | Admitting: Psychiatry

## 2017-08-10 VITALS — BP 161/90 | HR 71 | Temp 98.0°F | Wt 211.6 lb

## 2017-08-10 DIAGNOSIS — F09 Unspecified mental disorder due to known physiological condition: Secondary | ICD-10-CM

## 2017-08-10 DIAGNOSIS — F320 Major depressive disorder, single episode, mild: Secondary | ICD-10-CM | POA: Diagnosis not present

## 2017-08-10 DIAGNOSIS — F410 Panic disorder [episodic paroxysmal anxiety] without agoraphobia: Secondary | ICD-10-CM | POA: Diagnosis not present

## 2017-08-10 MED ORDER — CLONAZEPAM 0.5 MG PO TABS: 0.2500 mg | ORAL_TABLET | Freq: Every day | ORAL | 1 refills | Status: DC | PRN

## 2017-08-10 MED ORDER — BUSPIRONE HCL 10 MG PO TABS: 5.0000 mg | ORAL_TABLET | Freq: Two times a day (BID) | ORAL | 2 refills | Status: DC

## 2017-08-10 NOTE — Patient Instructions (Signed)
Please start taking Klonopin 0.25 mg on mondays and fridays .  Please start taking Klonopin 0.25 mg daily as needed for severe anxiety sx , if all your coping and relaxation techniques does not help. The goal is to get you off of Klonopin.
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
## 2017-08-14 ENCOUNTER — Ambulatory Visit (INDEPENDENT_AMBULATORY_CARE_PROVIDER_SITE_OTHER): Payer: Medicare Other

## 2017-08-14 DIAGNOSIS — Z86718 Personal history of other venous thrombosis and embolism: Secondary | ICD-10-CM

## 2017-08-14 LAB — POCT INR
INR: 3.2
PT: 37.9

## 2017-08-14 NOTE — Patient Instructions (Signed)
Description   Hold for 2 days then 10 mg daily except 5 mg on M / W / F.  F/u 2 weeks

## 2017-08-28 ENCOUNTER — Ambulatory Visit (INDEPENDENT_AMBULATORY_CARE_PROVIDER_SITE_OTHER): Payer: Medicare Other | Admitting: Emergency Medicine

## 2017-08-28 DIAGNOSIS — Z86718 Personal history of other venous thrombosis and embolism: Secondary | ICD-10-CM

## 2017-08-28 LAB — POCT INR
INR: 2.8
PT: 34.1

## 2017-09-07 ENCOUNTER — Other Ambulatory Visit: Payer: Self-pay

## 2017-09-07 ENCOUNTER — Ambulatory Visit (INDEPENDENT_AMBULATORY_CARE_PROVIDER_SITE_OTHER): Payer: Medicare Other | Admitting: Psychiatry

## 2017-09-07 ENCOUNTER — Encounter: Payer: Self-pay | Admitting: Psychiatry

## 2017-09-07 VITALS — BP 125/77 | HR 87 | Temp 98.5°F | Wt 215.2 lb

## 2017-09-07 DIAGNOSIS — F0631 Mood disorder due to known physiological condition with depressive features: Secondary | ICD-10-CM | POA: Diagnosis not present

## 2017-09-07 DIAGNOSIS — F17200 Nicotine dependence, unspecified, uncomplicated: Secondary | ICD-10-CM | POA: Diagnosis not present

## 2017-09-07 DIAGNOSIS — F09 Unspecified mental disorder due to known physiological condition: Secondary | ICD-10-CM

## 2017-09-07 DIAGNOSIS — F410 Panic disorder [episodic paroxysmal anxiety] without agoraphobia: Secondary | ICD-10-CM

## 2017-09-07 DIAGNOSIS — F1010 Alcohol abuse, uncomplicated: Secondary | ICD-10-CM

## 2017-09-07 MED ORDER — BUSPIRONE HCL 10 MG PO TABS: 5.0000 mg | ORAL_TABLET | Freq: Two times a day (BID) | ORAL | 1 refills | Status: DC

## 2017-09-07 MED ORDER — DULOXETINE HCL 60 MG PO CPEP: 60.0000 mg | ORAL_CAPSULE | Freq: Every day | ORAL | 1 refills | Status: DC

## 2017-09-07 NOTE — Progress Notes (Signed)
[facility] [name] OP Progress Note  09/07/2017 5:47 PM Chase [name]  MRN:  [id number]  Chief Complaint: ' I am here for follow up.' Chief Complaint    Follow-up; Medication Refill     HPI: Chase [name] is a 67 year old Caucasian male, has a history of depressive disorder, panic attacks, cognitive impairment, alcohol use disorder, presented to the clinic today for a follow-up visit.  Today reports he is currently compliant on his medications, Cymbalta, BuSpar.  He reports he was able to taper himself off of the Klonopin completely.  He reports his mood symptoms as improved.  He denies any significant anxiety symptoms.  He reports sleep is good.  His wife Chase [name] send a written note about how patient is improving on the new medication.  The last time the patient had a panic attack was end of April when he had a dental appointment.  He however is able to cope with this anxiety better than before.  Patient has a history of cognitive issues and continues to struggle with memory problems.  His wife however is supportive.  Patient continues to use alcohol-3-4 beers per day.  He reports he is not ready to quit.  Patient continues to smoke cigarettes and reports he is not ready to quit smoking.  Patient continues to be in psychotherapy with Chase [name].  He denies any concerns today. Visit Diagnosis:    ICD-10-CM   1. Depressive disorder due to another medical condition with depressive features F06.31   2. Panic attacks F41.0   3. Tobacco use disorder F17.200 busPIRone (BUSPAR) 10 MG tablet  4. Mild cognitive disorder F09   5. Alcohol use disorder, mild, abuse F10.10     Past Psychiatric History: Reviewed past psychiatric history from my progress note on 08/10/2017.  Past trials of Effexor, Wellbutrin, Cymbalta, Xanax, Klonopin.  Past Medical History:  Past Medical History:  Diagnosis Date  . Cerebrovascular accident (CVA) ([place]) 10/09/2011  . Clotting disorder ([place])   . DVT (deep venous thrombosis)  ([place])   . Hearing deficit   . History of ischemic vertebrobasilar artery thalamic stroke 2001  . History of stroke   . Hyperlipidemia   . Hypertension   . Patent foramen ovale   . Right knee DJD   . Stroke [facility]) 2001    Past Surgical History:  Procedure Laterality Date  . APPENDECTOMY    . FRACTURE SURGERY     R leg  . INCISION AND DRAINAGE Right 06/30/2012   Procedure: INCISION AND DRAINAGE;  Surgeon: Chase [name];  Location: [place];  Service: Orthopedics;  Laterality: Right;  . KNEE ARTHROSCOPY     right knee  . KNEE BURSECTOMY Right 06/30/2012   Procedure: KNEE BURSECTOMY;  Surgeon: Chase [name];  Location: [place];  Service: Orthopedics;  Laterality: Right;  Pre-patellar with wound closure  . TOTAL KNEE ARTHROPLASTY  01/26/2012   Procedure: TOTAL KNEE ARTHROPLASTY;  Surgeon: Chase [name];  Location: [place];  Service: Orthopedics;  Laterality: Right;    Family Psychiatric History: Reviewed family psychiatric history from my progress note on 08/10/2017. Family History:  Family History  Problem Relation Age of Onset  . Cancer Father   . Seizures Brother    Substance abuse history: Used to drink 6-7 beers per day in the past.  He is currently down to 4 drinks per day.  He however reports he wants to stay on the same.  Social History: Reviewed social history from my progress  note on 08/10/2017 Social History   Socioeconomic History  . Marital status: Married    Spouse name: debra  . Number of children: 0  . Years of education: 42  . Highest education level: Master's degree (e.g., MA, MS, MEng, MEd, MSW, MBA)  Occupational History  . Not on file  Social Needs  . Financial resource strain: Not hard at all  . Food insecurity:    Worry: Never true    Inability: Never true  . Transportation needs:    Medical: No    Non-medical: No  Tobacco Use  . Smoking status: Current Some Day Smoker    Packs/day: 1.00    Years: 49.00    Pack years: 49.00    Types:  Cigarettes    Last attempt to quit: 06/13/2016    Years since quitting: 1.2  . Smokeless tobacco: Never Used  . Tobacco comment: started smoking at age 67  Substance and Sexual Activity  . Alcohol use: Yes    Alcohol/week: 25.2 oz    Types: 42 Cans of beer per week    Comment: 4 beers a day  . Drug use: No  . Sexual activity: Not on file  Lifestyle  . Physical activity:    Days per week: 0 days    Minutes per session: 0 min  . Stress: To some extent  Relationships  . Social connections:    Talks on phone: Never    Gets together: Once a week    Attends religious service: Never    Active member of club or organization: No    Attends meetings of clubs or organizations: Never    Relationship status: Married  Other Topics Concern  . Not on file  Social History Narrative  . Not on file    Allergies: No Known Allergies  Metabolic Disorder Labs: No results found for: HGBA1C, MPG No results found for: PROLACTIN Lab Results  Component Value Date   CHOL 180 07/11/2016   TRIG 79 07/11/2016   HDL 59 07/11/2016   CHOLHDL 3.1 07/11/2016   LDLCALC 105 (H) 07/11/2016   LDLCALC 88 12/01/2013   Lab Results  Component Value Date   TSH 2.25 03/13/2017    Therapeutic Level Labs: No results found for: LITHIUM No results found for: VALPROATE No components found for:  CBMZ  Current Medications: Current Outpatient Medications  Medication Sig Dispense Refill  . acetaminophen (TYLENOL) 325 MG tablet Take 2 tablets (650 mg total) by mouth every 6 (six) hours as needed for pain. 60 tablet 0  . amLODipine-benazepril (LOTREL) 5-20 MG capsule TAKE 1 CAPSULE EVERY DAY 90 capsule 4  . busPIRone (BUSPAR) 10 MG tablet Take 0.5 tablets (5 mg total) by mouth 2 (two) times daily. 90 tablet 1  . DULoxetine (CYMBALTA) 60 MG capsule Take 1 capsule (60 mg total) by mouth daily. 90 capsule 1  . montelukast (SINGULAIR) 10 MG tablet Take 1 tablet (10 mg total) by mouth daily. For cough, nasal drainage  and allergies 30 tablet 3  . warfarin (COUMADIN) 5 MG tablet TAKE 1 OR 2 TABLETS (5 OR 10MG ) DAILY AS DIRECTED BY PHYSICIAN 180 tablet 3   No current facility-administered medications for this visit.      Musculoskeletal: Strength & Muscle Tone: within normal limits Gait & Station: normal Patient leans: N/A  Psychiatric Specialty Exam: Review of Systems  Psychiatric/Behavioral: Positive for depression. The patient is nervous/anxious.   All other systems reviewed and are negative.   Blood pressure 125/77,  pulse 87, temperature 98.5 F (36.9 C), temperature source Oral, weight 215 lb 3.2 oz (97.6 kg).Body mass index is 30.01 kg/m.  General Appearance: Casual  Eye Contact:  Fair  Speech:  Clear and Coherent  Volume:  Normal  Mood:  Anxious  Affect:  Congruent  Thought Process:  Goal Directed and Descriptions of Associations: Intact  Orientation:  Other:  place, person, month  Thought Content: Logical   Suicidal Thoughts:  No  Homicidal Thoughts:  No  Memory:  Immediate;   Fair Recent;   Fair Remote;   Fair  Judgement:  Fair  Insight:  Fair  Psychomotor Activity:  Normal  Concentration:  Concentration: Fair and Attention Span: Fair  Recall:  [employer] of Knowledge: Fair  Language: Fair  Akathisia:  No  Handed:  Right  AIMS (if indicated): na  Assets:  Communication Skills Desire for Improvement Social Support  ADL's:  Intact  Cognition: WNL  Sleep:  Fair   Screenings: PHQ2-9     Clinical Support from 04/10/2017 in [place] Visit from 12/18/2016 in [place] Visit from 07/11/2016 in [place]  PHQ-2 Total Score  6  6  1   PHQ-9 Total Score  15  24  4        Assessment and Plan: Garold is a 67 year old Caucasian male, has a history of depressive disorder, panic attacks, cognitive impairment, alcohol use disorder, presented to the clinic today for a follow-up visit.  Patient has a history of left sided  thalamic stroke, cognitive impairment from the same.  Patient is currently compliant with his medication and has improved anxiety and mood symptoms.  Continue plan as noted below.  Plan Depressive disorder due to another medical condition left sided stroke Continue Cymbalta 60 mg p.o. daily BuSpar 5 mg p.o. twice daily Continue CBT with Chase [name].  Panic attack Patient stopped taking Klonopin. BuSpar and Cymbalta to be continued as prescribed   For alcohol use disorder Provided substance abuse counseling.  Follow-up in clinic in 2-3 months or sooner if needed.  More than 50 % of the time was spent for psychoeducation and supportive psychotherapy and care coordination.  This note was generated in part or whole with voice recognition software. Voice recognition is usually quite accurate but there are transcription errors that can and very often do occur. I apologize for any typographical errors that were not detected and corrected.       Ursula Alert, [name] 09/08/2017, 8:54 AM

## 2017-09-07 NOTE — Patient Instructions (Signed)
Coping with Quitting Smoking Quitting smoking is a physical and mental challenge. You will face cravings, withdrawal symptoms, and temptation. Before quitting, work with your health care provider to make a plan that can help you cope. Preparation can help you quit and keep you from giving in. How can I cope with cravings? Cravings usually last for 5-10 minutes. If you get through it, the craving will pass. Consider taking the following actions to help you cope with cravings:  Keep your mouth busy: ? Chew sugar-free gum. ? Suck on hard candies or a straw. ? Brush your teeth.  Keep your hands and body busy: ? Immediately change to a different activity when you feel a craving. ? Squeeze or play with a ball. ? Do an activity or a hobby, like making bead jewelry, practicing needlepoint, or working with wood. ? Mix up your normal routine. ? Take a short exercise break. Go for a quick walk or run up and down stairs. ? Spend time in public places where smoking is not allowed.  Focus on doing something kind or helpful for someone else.  Call a friend or family member to talk during a craving.  Join a support group.  Call a quit line, such as 1-800-QUIT-NOW.  Talk with your health care provider about medicines that might help you cope with cravings and make quitting easier for you.  How can I deal with withdrawal symptoms? Your body may experience negative effects as it tries to get used to not having nicotine in the system. These effects are called withdrawal symptoms. They may include:  Feeling hungrier than normal.  Trouble concentrating.  Irritability.  Trouble sleeping.  Feeling depressed.  Restlessness and agitation.  Craving a cigarette.  To manage withdrawal symptoms:  Avoid places, people, and activities that trigger your cravings.  Remember why you want to quit.  Get plenty of sleep.  Avoid coffee and other caffeinated drinks. These may worsen some of your  symptoms.  How can I handle social situations? Social situations can be difficult when you are quitting smoking, especially in the first few weeks. To manage this, you can:  Avoid parties, bars, and other social situations where people might be smoking.  Avoid alcohol.  Leave right away if you have the urge to smoke.  Explain to your family and friends that you are quitting smoking. Ask for understanding and support.  Plan activities with friends or family where smoking is not an option.  What are some ways I can cope with stress? Wanting to smoke may cause stress, and stress can make you want to smoke. Find ways to manage your stress. Relaxation techniques can help. For example:  Breathe slowly and deeply, in through your nose and out through your mouth.  Listen to soothing, relaxing music.  Talk with a family member or friend about your stress.  Light a candle.  Soak in a bath or take a shower.  Think about a peaceful place.  What are some ways I can prevent weight gain? Be aware that many people gain weight after they quit smoking. However, not everyone does. To keep from gaining weight, have a plan in place before you quit and stick to the plan after you quit. Your plan should include:  Having healthy snacks. When you have a craving, it may help to: ? Eat plain popcorn, crunchy carrots, celery, or other cut vegetables. ? Chew sugar-free gum.  Changing how you eat: ? Eat small portion sizes at meals. ?   Eat 4-6 small meals throughout the day instead of 1-2 large meals a day. ? Be mindful when you eat. Do not watch television or do other things that might distract you as you eat.  Exercising regularly: ? Make time to exercise each day. If you do not have time for a long workout, do short bouts of exercise for 5-10 minutes several times a day. ? Do some form of strengthening exercise, like weight lifting, and some form of aerobic exercise, like running or  swimming.  Drinking plenty of water or other low-calorie or no-calorie drinks. Drink 6-8 glasses of water daily, or as much as instructed by your health care provider.  Summary  Quitting smoking is a physical and mental challenge. You will face cravings, withdrawal symptoms, and temptation to smoke again. Preparation can help you as you go through these challenges.  You can cope with cravings by keeping your mouth busy (such as by chewing gum), keeping your body and hands busy, and making calls to family, friends, or a helpline for people who want to quit smoking.  You can cope with withdrawal symptoms by avoiding places where people smoke, avoiding drinks with caffeine, and getting plenty of rest.  Ask your health care provider about the different ways to prevent weight gain, avoid stress, and handle social situations. This information is not intended to replace advice given to you by your health care provider. Make sure you discuss any questions you have with your health care provider. Document Released: 04/11/2016 Document Revised: 04/11/2016 Document Reviewed: 04/11/2016 Elsevier Interactive Patient Education  2018 [employer]  

## 2017-09-08 ENCOUNTER — Encounter: Payer: Self-pay | Admitting: Psychiatry

## 2017-09-18 ENCOUNTER — Ambulatory Visit (INDEPENDENT_AMBULATORY_CARE_PROVIDER_SITE_OTHER): Payer: Medicare Other

## 2017-09-18 DIAGNOSIS — Z86718 Personal history of other venous thrombosis and embolism: Secondary | ICD-10-CM | POA: Diagnosis not present

## 2017-09-18 LAB — POCT INR
INR: 2.7 (ref 2.0–3.0)
PT: 36.6

## 2017-09-18 NOTE — Patient Instructions (Signed)
Description   Continue 10 mg daily except 5 mg on M / W / F.  F/u 4 weeks.

## 2017-10-16 ENCOUNTER — Ambulatory Visit (INDEPENDENT_AMBULATORY_CARE_PROVIDER_SITE_OTHER): Payer: Medicare Other

## 2017-10-16 DIAGNOSIS — Z86718 Personal history of other venous thrombosis and embolism: Secondary | ICD-10-CM

## 2017-10-16 LAB — POCT INR
INR: 3.3 — AB (ref 2.0–3.0)
PT: 39.7

## 2017-10-16 NOTE — Patient Instructions (Signed)
Description   5 mg daily except 10 mg M, W, and F. Follow up in 3 weeks

## 2017-11-06 ENCOUNTER — Ambulatory Visit (INDEPENDENT_AMBULATORY_CARE_PROVIDER_SITE_OTHER): Payer: Medicare Other

## 2017-11-06 DIAGNOSIS — Z86718 Personal history of other venous thrombosis and embolism: Secondary | ICD-10-CM

## 2017-11-06 LAB — POCT INR
INR: 2.6 (ref 2.0–3.0)
PT: 31.1

## 2017-11-06 NOTE — Patient Instructions (Signed)
Description   5 mg daily except 10 mg M, W, and F. Follow up in 3 weeks

## 2017-11-10 ENCOUNTER — Other Ambulatory Visit: Payer: Self-pay | Admitting: Family Medicine

## 2017-11-10 DIAGNOSIS — J301 Allergic rhinitis due to pollen: Secondary | ICD-10-CM

## 2017-11-11 ENCOUNTER — Other Ambulatory Visit: Payer: Self-pay | Admitting: Family Medicine

## 2017-11-11 DIAGNOSIS — J301 Allergic rhinitis due to pollen: Secondary | ICD-10-CM

## 2017-11-11 NOTE — Telephone Encounter (Signed)
Pt needs refill on his   Montelukast 10 mg  1 year supply  [employer]  Pt' s CB#  [phone]  [place]

## 2017-11-11 NOTE — Telephone Encounter (Signed)
[facility] for this medication. Does he want [place] to cancel the [facility] and send new one to [place]?

## 2017-11-12 MED ORDER — MONTELUKAST SODIUM 10 MG PO TABS: 10.0000 mg | ORAL_TABLET | Freq: Every day | ORAL | 4 refills | Status: DC

## 2017-11-12 NOTE — Telephone Encounter (Signed)
Tried calling patient. Left message to call back. 

## 2017-11-12 NOTE — Telephone Encounter (Signed)
[facility] call [facility] and cancel prescription. New prescription has been sent to [facility].

## 2017-11-12 NOTE — Telephone Encounter (Signed)
Patient's wife Chase [name] returned call. Chase [name] stated that they do want [facility] canceled and new rx to be sent to [facility].

## 2017-11-13 NOTE — Telephone Encounter (Signed)
[facility] was [facility] at [facility].

## 2017-11-16 ENCOUNTER — Telehealth: Payer: Self-pay

## 2017-11-16 NOTE — Telephone Encounter (Signed)
pt called states that he is shaky hands and sweaty hands and he wanted to know if he can increase his buspar.

## 2017-11-18 NOTE — Telephone Encounter (Signed)
He can increase to 10 mg daily in morning and keep 5 mg at night until he sees Dr. Shea Evans in August. Thanks

## 2017-11-18 NOTE — Telephone Encounter (Signed)
pt wife called states it not any better wants to know if buspar can be increase or if something else can be called in.

## 2017-12-04 ENCOUNTER — Ambulatory Visit (INDEPENDENT_AMBULATORY_CARE_PROVIDER_SITE_OTHER): Payer: Medicare Other

## 2017-12-04 DIAGNOSIS — Z86718 Personal history of other venous thrombosis and embolism: Secondary | ICD-10-CM

## 2017-12-04 LAB — POCT INR
INR: 2.7 (ref 2.0–3.0)
PT: 32.7

## 2017-12-07 ENCOUNTER — Other Ambulatory Visit: Payer: Self-pay

## 2017-12-07 ENCOUNTER — Ambulatory Visit (INDEPENDENT_AMBULATORY_CARE_PROVIDER_SITE_OTHER): Payer: Medicare Other | Admitting: Psychiatry

## 2017-12-07 ENCOUNTER — Encounter: Payer: Self-pay | Admitting: Psychiatry

## 2017-12-07 VITALS — BP 150/81 | HR 78 | Temp 98.8°F | Wt 214.6 lb

## 2017-12-07 DIAGNOSIS — F0631 Mood disorder due to known physiological condition with depressive features: Secondary | ICD-10-CM

## 2017-12-07 DIAGNOSIS — F410 Panic disorder [episodic paroxysmal anxiety] without agoraphobia: Secondary | ICD-10-CM | POA: Diagnosis not present

## 2017-12-07 DIAGNOSIS — F1010 Alcohol abuse, uncomplicated: Secondary | ICD-10-CM | POA: Diagnosis not present

## 2017-12-07 DIAGNOSIS — F09 Unspecified mental disorder due to known physiological condition: Secondary | ICD-10-CM

## 2017-12-07 DIAGNOSIS — F17200 Nicotine dependence, unspecified, uncomplicated: Secondary | ICD-10-CM | POA: Diagnosis not present

## 2017-12-07 MED ORDER — BUSPIRONE HCL 15 MG PO TABS: 15.0000 mg | ORAL_TABLET | Freq: Three times a day (TID) | ORAL | 1 refills | Status: DC

## 2017-12-07 MED ORDER — HYDROXYZINE PAMOATE 25 MG PO CAPS: 25.0000 mg | ORAL_CAPSULE | Freq: Every day | ORAL | 1 refills | Status: DC | PRN

## 2017-12-07 NOTE — Progress Notes (Signed)
Chase [name] Progress Note  12/07/2017 4:36 PM Chase [name]  MRN:  [id number]  Chief Complaint: ' I am here for follow up ." Chief Complaint    Follow-up; Anxiety; Panic Attack; Medication Refill     HPI: Chase [name] is a 67 year old Caucasian male, has a history of depressive disorder, panic attacks, cognitive impairment, alcohol use disorder, presented to the clinic today for a follow-up visit.  Patient today reports he started getting panic attacks again few weeks ago.  Patient called the clinic and was advised to increase his BuSpar to 10 mg twice a day.  Patient reports he tried doing that however he continued to have panic symptoms.  He describes his panic symptoms as nervousness, racing heart rate usually in the morning prior to getting out of the bed.  Patient reports he wakes up that way most of the days.  Patient continues to take Cymbalta as prescribed.  He is completely off of the Klonopin and denies any withdrawal symptoms.  Patient continues to be in psychotherapy with [name] which is going well.  Patient continues to use alcohol, 4-5 beers in the evening.  Discussed with patient about the effect of alcohol on his anxiety symptoms.  Patient continues to have cognitive problems and his wife continues to be supportive.  Discussed medication readjustment, patient reports he wants to stay on the Cymbalta for now however is okay with increasing his BuSpar.   Visit Diagnosis:    ICD-10-CM   1. Panic attacks F41.0 busPIRone (BUSPAR) 15 MG tablet    hydrOXYzine (VISTARIL) 25 MG capsule  2. Tobacco use disorder F17.200   3. Alcohol use disorder, mild, abuse F10.10   4. Mild cognitive disorder F09   5. 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  Past trials of  Effexor, Wellbutrin, Cymbalta, Xanax, Klonopin.  Past Medical History:  Past Medical History:  Diagnosis Date  . Cerebrovascular accident (CVA) ([place]) 10/09/2011  . Clotting disorder ([place])   . DVT (deep venous thrombosis) ([place])   . Hearing deficit   . History of ischemic vertebrobasilar artery thalamic stroke 2001  . History of stroke   . Hyperlipidemia   . Hypertension   . Patent foramen ovale   . Right knee DJD   . Stroke [facility]) 2001    Past Surgical History:  Procedure Laterality Date  . APPENDECTOMY    . FRACTURE SURGERY     R leg  . INCISION AND DRAINAGE Right 06/30/2012   Procedure: INCISION AND DRAINAGE;  Surgeon: Lorn Junes, MD;  Location: [place] [facility];  Service: Orthopedics;  Laterality: Right;  . KNEE ARTHROSCOPY     right knee  . KNEE BURSECTOMY Right 06/30/2012   Procedure: KNEE BURSECTOMY;  Surgeon: Lorn Junes, MD;  Location: [place];  Service: Orthopedics;  Laterality: Right;  Pre-patellar with wound closure  . TOTAL KNEE ARTHROPLASTY  01/26/2012   Procedure: TOTAL KNEE ARTHROPLASTY;  Surgeon: Lorn Junes, MD;  Location: [place];  Service: Orthopedics;  Laterality: Right;    Family Psychiatric History: Reviewed family psychiatric history from my progress note on 08/10/2017.  Family History:  Family History  Problem Relation Age of Onset  . Cancer Father   . Seizures Brother    Substance abuse history: He drinks 6-7 beers per  day in the past.  He continues to drink up to 4 beers per day.  Social History: Reviewed social history from my progress note on 08/10/2017. Social History   Socioeconomic History  . Marital status: Married    Spouse name: debra  . Number of children: 0  . Years of education: 15  . Highest education level: Master's degree (e.g., MA, MS, MEng, MEd, MSW, MBA)  Occupational History  . Not on file  Social Needs  . Financial resource strain: Not hard at all  . Food insecurity:    Worry: Never true    Inability: Never true  .  Transportation needs:    Medical: No    Non-medical: No  Tobacco Use  . Smoking status: Current Some Day Smoker    Packs/day: 1.00    Years: 49.00    Pack years: 49.00    Types: Cigarettes    Last attempt to quit: 06/13/2016    Years since quitting: 1.4  . Smokeless tobacco: Never Used  . Tobacco comment: started smoking at age 77  Substance and Sexual Activity  . Alcohol use: Yes    Alcohol/week: 42.0 standard drinks    Types: 42 Cans of beer per week    Comment: 4 beers a day  . Drug use: No  . Sexual activity: Not on file  Lifestyle  . Physical activity:    Days per week: 0 days    Minutes per session: 0 min  . Stress: To some extent  Relationships  . Social connections:    Talks on phone: Never    Gets together: Once a week    Attends religious service: Never    Active member of club or organization: No    Attends meetings of clubs or organizations: Never    Relationship status: Married  Other Topics Concern  . Not on file  Social History Narrative  . Not on file    Allergies: No Known Allergies  Metabolic Disorder Labs: No results found for: HGBA1C, MPG No results found for: PROLACTIN Lab Results  Component Value Date   CHOL 180 07/11/2016   TRIG 79 07/11/2016   HDL 59 07/11/2016   CHOLHDL 3.1 07/11/2016   LDLCALC 105 (H) 07/11/2016   LDLCALC 88 12/01/2013   Lab Results  Component Value Date   TSH 2.25 03/13/2017    Therapeutic Level Labs: No results found for: LITHIUM No results found for: VALPROATE No components found for:  CBMZ  Current Medications: Current Outpatient Medications  Medication Sig Dispense Refill  . acetaminophen (TYLENOL) 325 MG tablet Take 2 tablets (650 mg total) by mouth every 6 (six) hours as needed for pain. 60 tablet 0  . amLODipine-benazepril (LOTREL) 5-20 MG capsule TAKE 1 CAPSULE EVERY DAY 90 capsule 4  . doxycycline (VIBRA-TABS) 100 MG tablet TAKE 1 TABLET BY MOUTH TWICE A DAY UNTIL FINISHED  0  . DULoxetine  (CYMBALTA) 60 MG capsule Take 1 capsule (60 mg total) by mouth daily. 90 capsule 1  . fluticasone (FLONASE) 50 MCG/ACT nasal spray Place into the nose.    . montelukast (SINGULAIR) 10 MG tablet Take 1 tablet (10 mg total) by mouth daily. For cough, nasal drainage and allergies 90 tablet 4  . warfarin (COUMADIN) 5 MG tablet TAKE 1 OR 2 TABLETS (5 OR 10MG ) DAILY AS DIRECTED BY PHYSICIAN 180 tablet 3  . busPIRone (BUSPAR) 15 MG tablet Take 1 tablet (15 mg total) by mouth 3 (three) times daily. 270 tablet 1  .  hydrOXYzine (VISTARIL) 25 MG capsule Take 1 capsule (25 mg total) by mouth daily as needed for anxiety (for sever panic sx). 90 capsule 1   No current facility-administered medications for this visit.      Musculoskeletal: Strength & Muscle Tone: within normal limits Gait & Station: normal Patient leans: N/A  Psychiatric Specialty Exam: Review of Systems  Psychiatric/Behavioral: The patient is nervous/anxious.   All other systems reviewed and are negative.   Blood pressure (!) 150/81, pulse 78, temperature 98.8 F (37.1 C), temperature source Oral, weight 214 lb 9.6 oz (97.3 kg).Body mass index is 29.93 kg/m.  General Appearance: Casual  Eye Contact:  Fair  Speech:  Clear and Coherent  Volume:  Normal  Mood:  Anxious  Affect:  Congruent  Thought Process:  Goal Directed and Descriptions of Associations: Intact  Orientation:  Full (Time, Place, and Person)  Thought Content: Logical   Suicidal Thoughts:  No  Homicidal Thoughts:  No  Memory:  Immediate;   Fair Recent;   Fair Remote;   Fair  Judgement:  Fair  Insight:  Fair  Psychomotor Activity:  Normal  Concentration:  Concentration: Fair and Attention Span: Fair  Recall:  [employer] of Knowledge: Fair  Language: Fair  Akathisia:  No  Handed:  Right  AIMS (if indicated): na  Assets:  Communication Skills Desire for Improvement Social Support  ADL's:  Intact  Cognition: WNL  Sleep:  Fair   Screenings: PHQ2-9      Clinical Support from 04/10/2017 in [place] Visit from 12/18/2016 in [place] Visit from 07/11/2016 in [place]  PHQ-2 Total Score  6  6  1   PHQ-9 Total Score  15  24  4        Assessment and Plan: Chase [name] is a 67 year old Caucasian male, has a history of depressive disorder, panic attacks, cognitive impairment, alcohol use disorder, presented to the clinic today for a follow-up visit.  Patient has a history of left-sided thalamic stroke, cognitive impairment from the same.  He continues to struggle with panic symptoms, discussed medication readjustment as noted below.  Plan Depressive disorder due to another medical condition-left sided stroke Continue Cymbalta 60 mg p.o. daily for now.  Patient reports he does not want to make any changes yet. Increase BuSpar to 15 mg p.o. 3 times daily Continue CBT with [name].  Panic attacks Increase BuSpar to 15 mg p.o. 3 times daily Start hydroxyzine 25 mg p.o. daily as needed for severe anxiety attacks Continue psychotherapy.  For alcohol use disorder Provided substance abuse counseling.  Follow-up in clinic in 4 weeks.  More than 50 % of the time was spent for psychoeducation and supportive psychotherapy and care coordination. This note was generated in part or whole with voice recognition software. Voice recognition is usually quite accurate but there are transcription errors that can and very often do occur. I apologize for any typographical errors that were not detected and corrected.       Ursula Alert, MD 12/07/2017, 4:36 PM

## 2017-12-07 NOTE — Patient Instructions (Signed)
Living With Anxiety After being diagnosed with an anxiety disorder, you may be relieved to know why you have felt or behaved a certain way. It is natural to also feel overwhelmed about the treatment ahead and what it will mean for your life. With care and support, you can manage this condition and recover from it. How to cope with anxiety Dealing with stress Stress is your body's reaction to life changes and events, both good and bad. Stress can last just a few hours or it can be ongoing. Stress can play a major role in anxiety, so it is important to learn both how to cope with stress and how to think about it differently. Talk with your health care provider or a counselor to learn more about stress reduction. He or she may suggest some stress reduction techniques, such as:  Music therapy. This can include creating or listening to music that you enjoy and that inspires you.  Mindfulness-based meditation. This involves being aware of your normal breaths, rather than trying to control your breathing. It can be done while sitting or walking.  Centering prayer. This is a kind of meditation that involves focusing on a word, phrase, or sacred image that is meaningful to you and that brings you peace.  Deep breathing. To do this, expand your stomach and inhale slowly through your nose. Hold your breath for 3-5 seconds. Then exhale slowly, allowing your stomach muscles to relax.  Self-talk. This is a skill where you identify thought patterns that lead to anxiety reactions and correct those thoughts.  Muscle relaxation. This involves tensing muscles then relaxing them.  Choose a stress reduction technique that fits your lifestyle and personality. Stress reduction techniques take time and practice. Set aside 5-15 minutes a day to do them. Therapists can offer training in these techniques. The training may be covered by some insurance plans. Other things you can do to manage stress include:  Keeping a  stress diary. This can help you learn what triggers your stress and ways to control your response.  Thinking about how you respond to certain situations. You may not be able to control everything, but you can control your reaction.  Making time for activities that help you relax, and not feeling guilty about spending your time in this way.  Therapy combined with coping and stress-reduction skills provides the best chance for successful treatment. Medicines Medicines can help ease symptoms. Medicines for anxiety include:  Anti-anxiety drugs.  Antidepressants.  Beta-blockers.  Medicines may be used as the main treatment for anxiety disorder, along with therapy, or if other treatments are not working. Medicines should be prescribed by a health care provider. Relationships Relationships can play a big part in helping you recover. Try to spend more time connecting with trusted friends and family members. Consider going to couples counseling, taking family education classes, or going to family therapy. Therapy can help you and others better understand the condition. How to recognize changes in your condition Everyone has a different response to treatment for anxiety. Recovery from anxiety happens when symptoms decrease and stop interfering with your daily activities at home or work. This may mean that you will start to:  Have better concentration and focus.  Sleep better.  Be less irritable.  Have more energy.  Have improved memory.  It is important to recognize when your condition is getting worse. Contact your health care provider if your symptoms interfere with home or work and you do not feel like your condition   is improving. Where to find help and support: You can get help and support from these sources:  Self-help groups.  Online and [employer].  A trusted spiritual leader.  Couples counseling.  Family education classes.  Family therapy.  Follow these  instructions at home:  Eat a healthy diet that includes plenty of vegetables, fruits, whole grains, low-fat dairy products, and lean protein. Do not eat a lot of foods that are high in solid fats, added sugars, or salt.  Exercise. Most adults should do the following: ? Exercise for at least 150 minutes each week. The exercise should increase your heart rate and make you sweat (moderate-intensity exercise). ? Strengthening exercises at least twice a week.  Cut down on caffeine, tobacco, alcohol, and other potentially harmful substances.  Get the right amount and quality of sleep. Most adults need 7-9 hours of sleep each night.  Make choices that simplify your life.  Take over-the-counter and prescription medicines only as told by your health care provider.  Avoid caffeine, alcohol, and certain over-the-counter cold medicines. These may make you feel worse. Ask your pharmacist which medicines to avoid.  Keep all follow-up visits as told by your health care provider. This is important. Questions to ask your health care provider  Would I benefit from therapy?  How often should I follow up with a health care provider?  How long do I need to take medicine?  Are there any long-term side effects of my medicine?  Are there any alternatives to taking medicine? Contact a health care provider if:  You have a hard time staying focused or finishing daily tasks.  You spend many hours a day feeling worried about everyday life.  You become exhausted by worry.  You start to have headaches, feel tense, or have nausea.  You urinate more than normal.  You have diarrhea. Get help right away if:  You have a racing heart and shortness of breath.  You have thoughts of hurting yourself or others. If you ever feel like you may hurt yourself or others, or have thoughts about taking your own life, get help right away. You can go to your nearest emergency department or call:  Your local emergency  services (911 in the [place]  A suicide crisis helpline, such as the [place] at 4692649298. This is open 24-hours a day.  Summary  Taking steps to deal with stress can help calm you.  Medicines cannot cure anxiety disorders, but they can help ease symptoms.  Family, friends, and partners can play a big part in helping you recover from an anxiety disorder. This information is not intended to replace advice given to you by your health care provider. Make sure you discuss any questions you have with your health care provider. Document Released: 04/08/2016 Document Revised: 04/08/2016 Document Reviewed: 04/08/2016 Elsevier Interactive Patient Education  2018 [employer]. Alcohol Use Disorder Alcohol use disorder is when your drinking disrupts your daily life. When you have this condition, you drink too much alcohol and you cannot control your drinking. Alcohol use disorder can cause serious problems with your physical health. It can affect your brain, heart, liver, pancreas, immune system, stomach, and intestines. Alcohol use disorder can increase your risk for certain cancers and cause problems with your mental health, such as depression, anxiety, psychosis, delirium, and dementia. People with this disorder risk hurting themselves and others. What are the causes? This condition is caused by drinking too much alcohol over time. It is not  caused by drinking too much alcohol only one or two times. Some people with this condition drink alcohol to cope with or escape from negative life events. Others drink to relieve pain or symptoms of mental illness. What increases the risk? You are more likely to develop this condition if:  You have a family history of alcohol use disorder.  Your culture encourages drinking to the point of intoxication, or makes alcohol easy to get.  You had a mood or conduct disorder in childhood.  You have been a victim of abuse.  You are  an adolescent and: ? You have poor grades or difficulties in school. ? Your caregivers do not talk to you about saying no to alcohol, or supervise your activities. ? You are impulsive or you have trouble with self-control.  What are the signs or symptoms? Symptoms of this condition include:  Drinkingmore than you want to.  Drinking for longer than you want to.  Trying several times to drink less or to control your drinking.  Spending a lot of time getting alcohol, drinking, or recovering from drinking.  Craving alcohol.  Having problems at work, at school, or at home due to drinking.  Having problems in relationships due to drinking.  Drinking when it is dangerous to drink, such as before driving a car.  Continuing to drink even though you know you might have a physical or mental problem related to drinking.  Needing more and more alcohol to get the same effect you want from the alcohol (building up tolerance).  Having symptoms of withdrawal when you stop drinking. Symptoms of withdrawal include: ? Fatigue. ? Nightmares. ? Trouble sleeping. ? Depression. ? Anxiety. ? Fever. ? Seizures. ? Severe confusion. ? Feeling or seeing things that are not there (hallucinations). ? Tremors. ? Rapid heart rate. ? Rapid breathing. ? High blood pressure.  Drinking to avoid symptoms of withdrawal.  How is this diagnosed? This condition is diagnosed with an assessment. Your health care provider may start the assessment by asking three or four questions about your drinking. Your health care provider may perform a physical exam or do lab tests to see if you have physical problems resulting from alcohol use. She or he may refer you to a mental health professional for evaluation. How is this treated? Some people with alcohol use disorder are able to reduce their alcohol use to low-risk levels. Others need to completely quit drinking alcohol. When necessary, mental health professionals  with specialized training in substance use treatment can help. Your health care provider can help you decide how severe your alcohol use disorder is and what type of treatment you need. The following forms of treatment are available:  Detoxification. Detoxification involves quitting drinking and using prescription medicines within the first week to help lessen withdrawal symptoms. This treatment is important for people who have had withdrawal symptoms before and for heavy drinkers who are likely to have withdrawal symptoms. Alcohol withdrawal can be dangerous, and in severe cases, it can cause death. Detoxification may be provided in a home, community, or primary care setting, or in a hospital or substance use treatment facility.  Counseling. This treatment is also called talk therapy. It is provided by substance use treatment counselors. A counselor can address the reasons you use alcohol and suggest ways to keep you from drinking again or to prevent problem drinking. The goals of talk therapy are to: ? Find healthy activities and ways for you to cope with stress. ? Identify and  avoid the things that trigger your alcohol use. ? Help you learn how to handle cravings.  Medicines.Medicines can help treat alcohol use disorder by: ? Decreasing alcohol cravings. ? Decreasing the positive feeling you have when you drink alcohol. ? Causing an uncomfortable physical reaction when you drink alcohol (aversion therapy).  Support groups. Support groups are led by people who have quit drinking. They provide emotional support, advice, and guidance.  These forms of treatment are often combined. Some people with this condition benefit from a combination of treatments provided by specialized substance use treatment centers. Follow these instructions at home:  Take over-the-counter and prescription medicines only as told by your health care provider.  Check with your health care provider before starting any new  medicines.  Ask friends and family members not to offer you alcohol.  Avoid situations where alcohol is served, including gatherings where others are drinking alcohol.  Create a plan for what to do when you are tempted to use alcohol.  Find hobbies or activities that you enjoy that do not include alcohol.  Keep all follow-up visits as told by your health care provider. This is important. How is this prevented?  If you drink, limit alcohol intake to no more than 1 drink a day for nonpregnant women and 2 drinks a day for men. One drink equals 12 oz of beer, 5 oz of wine, or 1 oz of hard liquor.  If you have a mental health condition, get treatment and support.  Do not give alcohol to adolescents.  If you are an adolescent: ? Do not drink alcohol. ? Do not be afraid to say no if someone offers you alcohol. Speak up about why you do not want to drink. You can be a positive role model for your friends and set a good example for those around you by not drinking alcohol. ? If your friends drink, spend time with others who do not drink alcohol. Make new friends who do not use alcohol. ? Find healthy ways to manage stress and emotions, such as meditation or deep breathing, exercise, spending time in nature, listening to music, or talking with a trusted friend or family member. Contact a health care provider if:  You are not able to take your medicines as told.  Your symptoms get worse.  You return to drinking alcohol (relapse) and your symptoms get worse. Get help right away if:  You have thoughts about hurting yourself or others. If you ever feel like you may hurt yourself or others, or have thoughts about taking your own life, get help right away. You can go to your nearest emergency department or call:  Your local emergency services (911 in the [place]  A suicide crisis helpline, such as the [place] at (971)435-7711. This is open 24 hours a  day.  Summary  Alcohol use disorder is when your drinking disrupts your daily life. When you have this condition, you drink too much alcohol and you cannot control your drinking.  Treatment may include detoxification, counseling, medicine, and support groups.  Ask friends and family members not to offer you alcohol. Avoid situations where alcohol is served.  Get help right away if you have thoughts about hurting yourself or others. This information is not intended to replace advice given to you by your health care provider. Make sure you discuss any questions you have with your health care provider. Document Released: 05/22/2004 Document Revised: 01/10/2016 Document Reviewed: 01/10/2016 Elsevier Interactive Patient Education  [place] taking Buspar 15 mg three times a day.  Start taking Hydroxyzine 25 mg daily as needed for severe anxiety attacks , if you have failed all coping techniques as discussed by Ms.Miguel Dibble.

## 2018-01-06 ENCOUNTER — Ambulatory Visit: Payer: Self-pay

## 2018-01-06 DIAGNOSIS — H43313 Vitreous membranes and strands, bilateral: Secondary | ICD-10-CM | POA: Diagnosis not present

## 2018-01-08 ENCOUNTER — Ambulatory Visit: Payer: Self-pay

## 2018-01-15 ENCOUNTER — Ambulatory Visit (INDEPENDENT_AMBULATORY_CARE_PROVIDER_SITE_OTHER): Payer: Medicare Other

## 2018-01-15 DIAGNOSIS — Z23 Encounter for immunization: Secondary | ICD-10-CM

## 2018-01-15 DIAGNOSIS — Z86718 Personal history of other venous thrombosis and embolism: Secondary | ICD-10-CM | POA: Diagnosis not present

## 2018-01-15 LAB — POCT INR
INR: 3 (ref 2.0–3.0)
PT: 36

## 2018-01-15 NOTE — Addendum Note (Signed)
Addended by: Althea Charon D on: 01/15/2018 10:38 AM   Modules accepted: Orders

## 2018-01-15 NOTE — Patient Instructions (Signed)
Description   5 mg daily except 10 mg M, W, and F. Follow up in 4 weeks.

## 2018-02-12 ENCOUNTER — Ambulatory Visit (INDEPENDENT_AMBULATORY_CARE_PROVIDER_SITE_OTHER): Payer: Medicare Other

## 2018-02-12 DIAGNOSIS — Z86718 Personal history of other venous thrombosis and embolism: Secondary | ICD-10-CM | POA: Diagnosis not present

## 2018-02-12 LAB — POCT INR
INR: 3.1 — AB (ref 2.0–3.0)
PT: 37.2

## 2018-02-12 NOTE — Patient Instructions (Signed)
Hold for two days then continue current dose 5mg  daily except 10mg  M, W, F.  Recheck in four weeks.

## 2018-02-22 ENCOUNTER — Ambulatory Visit (INDEPENDENT_AMBULATORY_CARE_PROVIDER_SITE_OTHER): Payer: Medicare Other | Admitting: Psychiatry

## 2018-02-22 ENCOUNTER — Other Ambulatory Visit: Payer: Self-pay

## 2018-02-22 ENCOUNTER — Encounter: Payer: Self-pay | Admitting: Psychiatry

## 2018-02-22 VITALS — BP 145/81 | HR 80 | Temp 98.2°F | Wt 213.6 lb

## 2018-02-22 DIAGNOSIS — F0631 Mood disorder due to known physiological condition with depressive features: Secondary | ICD-10-CM | POA: Diagnosis not present

## 2018-02-22 DIAGNOSIS — F1010 Alcohol abuse, uncomplicated: Secondary | ICD-10-CM

## 2018-02-22 DIAGNOSIS — F09 Unspecified mental disorder due to known physiological condition: Secondary | ICD-10-CM

## 2018-02-22 DIAGNOSIS — F17200 Nicotine dependence, unspecified, uncomplicated: Secondary | ICD-10-CM

## 2018-02-22 DIAGNOSIS — F410 Panic disorder [episodic paroxysmal anxiety] without agoraphobia: Secondary | ICD-10-CM | POA: Diagnosis not present

## 2018-02-22 MED ORDER — DULOXETINE HCL 60 MG PO CPEP: 60.0000 mg | ORAL_CAPSULE | Freq: Every day | ORAL | 1 refills | Status: DC

## 2018-02-22 NOTE — Progress Notes (Signed)
[name] OP Progress Note  02/22/2018 4:11 PM Chase [name]  MRN:  [id number]  Chief Complaint: ' I am here for follow up.' Chief Complaint    Follow-up; Medication Refill     HPI: Chase [name] is a 67 year old Caucasian male, has a history of depression, panic attacks, cognitive disorder, alcohol use disorder, tobacco use disorder, presented to the clinic today for a follow-up visit.  Patient today reports he is currently doing well on the current medication regimen.  He denies any significant mood symptoms.  He denies any appetite changes, concentration problems, suicidality.  He reports sleep is good.  He denies any significant panic symptoms.  He reports 2-3 panic attacks since his last visit here in August.  He continues to be compliant with his medications.  He does not use the hydroxyzine as needed much and reports he has not really needed it.  He continues to be in psychotherapy with [name] which is going well.  Patient continues to have cognitive issues, he is alert, he is oriented to the situation, place as well as the month.  He could not answer the date.  He has good social support from his wife.  He continues to use alcohol, 4-5 beers in the evening.  He reports he is not ready to cut it down and who will not do it.  He also smokes cigarettes and reports he cannot quit.  Attempted to provide substance abuse counseling.   Visit Diagnosis:    ICD-10-CM   1. 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 Panic attacks F41.0   3. Tobacco use disorder F17.200   4. Alcohol use disorder, mild, abuse F10.10   5. Mild cognitive disorder F09     Past Psychiatric History: Have reviewed past psychiatric history from my progress note on 08/10/2017.  Past trials of Effexor, Wellbutrin, Cymbalta, Xanax, Klonopin.  Past Medical History:  Past Medical History:  Diagnosis Date  . Cerebrovascular accident (CVA) ([place])  10/09/2011  . Clotting disorder ([place])   . DVT (deep venous thrombosis) ([place])   . Hearing deficit   . History of ischemic vertebrobasilar artery thalamic stroke 2001  . History of stroke   . Hyperlipidemia   . Hypertension   . Patent foramen ovale   . Right knee DJD   . Stroke [facility]) 2001    Past Surgical History:  Procedure Laterality Date  . APPENDECTOMY    . FRACTURE SURGERY     R leg  . INCISION AND DRAINAGE Right 06/30/2012   Procedure: INCISION AND DRAINAGE;  Surgeon: Lorn Junes, MD;  Location: [facility];  Service: Orthopedics;  Laterality: Right;  . KNEE ARTHROSCOPY     right knee  . KNEE BURSECTOMY Right 06/30/2012   Procedure: KNEE BURSECTOMY;  Surgeon: Lorn Junes, MD;  Location: [facility];  Service: Orthopedics;  Laterality: Right;  Pre-patellar with wound closure  . TOTAL KNEE ARTHROPLASTY  01/26/2012   Procedure: TOTAL KNEE ARTHROPLASTY;  Surgeon: Lorn Junes, MD;  Location: [facility];  Service: Orthopedics;  Laterality: Right;    Family Psychiatric History: Have reviewed family psychiatric history from my progress note on 08/10/2017  Family History:  Family History  Problem Relation Age of Onset  . Cancer Father   . Seizures Brother     Social History: Reviewed social history from my progress note on 08/10/2017 Social History   Socioeconomic History  . Marital status:  Married    Spouse name: debra  . Number of children: 0  . Years of education: 20  . Highest education level: Master's degree (e.g., MA, MS, MEng, MEd, MSW, MBA)  Occupational History  . Not on file  Social Needs  . Financial resource strain: Not hard at all  . Food insecurity:    Worry: Never true    Inability: Never true  . Transportation needs:    Medical: No    Non-medical: No  Tobacco Use  . Smoking status: Current Some Day Smoker    Packs/day: 1.00    Years: 49.00    Pack years: 49.00    Types: Cigarettes    Last attempt to quit: 06/13/2016    Years since quitting: 1.6  . Smokeless  tobacco: Never Used  . Tobacco comment: started smoking at age 89  Substance and Sexual Activity  . Alcohol use: Yes    Alcohol/week: 42.0 standard drinks    Types: 42 Cans of beer per week    Comment: 4 beers a day  . Drug use: No  . Sexual activity: Not on file  Lifestyle  . Physical activity:    Days per week: 0 days    Minutes per session: 0 min  . Stress: To some extent  Relationships  . Social connections:    Talks on phone: Never    Gets together: Once a week    Attends religious service: Never    Active member of club or organization: No    Attends meetings of clubs or organizations: Never    Relationship status: Married  Other Topics Concern  . Not on file  Social History Narrative  . Not on file    Allergies: No Known Allergies  Metabolic Disorder Labs: No results found for: HGBA1C, MPG No results found for: PROLACTIN Lab Results  Component Value Date   CHOL 180 07/11/2016   TRIG 79 07/11/2016   HDL 59 07/11/2016   CHOLHDL 3.1 07/11/2016   LDLCALC 105 (H) 07/11/2016   LDLCALC 88 12/01/2013   Lab Results  Component Value Date   TSH 2.25 03/13/2017    Therapeutic Level Labs: No results found for: LITHIUM No results found for: VALPROATE No components found for:  CBMZ  Current Medications: Current Outpatient Medications  Medication Sig Dispense Refill  . acetaminophen (TYLENOL) 325 MG tablet Take 2 tablets (650 mg total) by mouth every 6 (six) hours as needed for pain. 60 tablet 0  . amLODipine-benazepril (LOTREL) 5-20 MG capsule TAKE 1 CAPSULE EVERY DAY 90 capsule 4  . busPIRone (BUSPAR) 15 MG tablet Take 1 tablet (15 mg total) by mouth 3 (three) times daily. 270 tablet 1  . doxycycline (VIBRA-TABS) 100 MG tablet TAKE 1 TABLET BY MOUTH TWICE A DAY UNTIL FINISHED  0  . DULoxetine (CYMBALTA) 60 MG capsule Take 1 capsule (60 mg total) by mouth daily. 90 capsule 1  . fluticasone (FLONASE) 50 MCG/ACT nasal spray Place into the nose.    . hydrOXYzine  (VISTARIL) 25 MG capsule Take 1 capsule (25 mg total) by mouth daily as needed for anxiety (for sever panic sx). 90 capsule 1  . montelukast (SINGULAIR) 10 MG tablet Take 1 tablet (10 mg total) by mouth daily. For cough, nasal drainage and allergies 90 tablet 4  . warfarin (COUMADIN) 5 MG tablet TAKE 1 OR 2 TABLETS (5 OR 10MG ) DAILY AS DIRECTED BY PHYSICIAN 180 tablet 3   No current facility-administered medications for this visit.  Musculoskeletal: Strength & Muscle Tone: within normal limits Gait & Station: normal Patient leans: N/A  Psychiatric Specialty Exam: Review of Systems  Psychiatric/Behavioral: Positive for depression (improved) and substance abuse. The patient is nervous/anxious (improved).   All other systems reviewed and are negative.   Blood pressure (!) 145/81, pulse 80, temperature 98.2 F (36.8 C), temperature source Oral, weight 213 lb 9.6 oz (96.9 kg).Body mass index is 29.79 kg/m.  General Appearance: Casual  Eye Contact:  Fair  Speech:  Clear and Coherent  Volume:  Normal  Mood:  Euthymic  Affect:  Congruent  Thought Process:  Goal Directed and Descriptions of Associations: Intact  Orientation:  Full (Time, Place, and Person)  Thought Content: Logical   Suicidal Thoughts:  No  Homicidal Thoughts:  No  Memory:  Immediate;   Fair Recent;   Fair Remote;  limited  Judgement:  Fair  Insight:  Fair  Psychomotor Activity:  Normal  Concentration:  Concentration: Fair and Attention Span: Fair  Recall:  [employer] of Knowledge: Fair  Language: Fair  Akathisia:  No  Handed:  Right  AIMS (if indicated): na  Assets:  Communication Skills Desire for Improvement Social Support  ADL's:  Intact  Cognition: WNL  Sleep:  Fair   Screenings: PHQ2-9     Clinical Support from 04/10/2017 in [facility] Visit from 12/18/2016 in [facility] Visit from 07/11/2016 in [facility]  PHQ-2 Total Score  6  6  1    PHQ-9 Total Score  15  24  4        Assessment and Plan: Daiquan is a 67 year old Caucasian male, has a history of depression, panic attacks, cognitive impairment, alcohol use disorder, presented to the clinic today for a follow-up visit.  Patient has a history of left-sided thalamic stroke, cognitive impairment.  He is currently improved with the current medication regimen.  He will continue psychotherapy sessions with Ms. Grandville Silos.  Plan as noted below.  Plan  Depression-in partial remission Cymbalta 60 mg p.o. daily. BuSpar 15 mg p.o. 3 times daily Continue CBT with [name]. PHQ 9 - 1  Panic attacks BuSpar 15 mg p.o. 3 times daily Hydroxyzine 25 mg p.o. daily as needed for severe anxiety attacks Continue psychotherapy  For tobacco use disorder Provided smoking cessation counseling.  For alcohol use disorder Provided substance abuse counseling.  Follow-up in clinic in 2-3 months or sooner if needed.  More than 50 % of the time was spent for psychoeducation and supportive psychotherapy and care coordination.  This note was generated in part or whole with voice recognition software. Voice recognition is usually quite accurate but there are transcription errors that can and very often do occur. I apologize for any typographical errors that were not detected and corrected.       Ursula Alert, MD 02/23/2018, 10:05 AM

## 2018-02-23 ENCOUNTER — Encounter: Payer: Self-pay | Admitting: Psychiatry

## 2018-03-11 ENCOUNTER — Telehealth: Payer: Self-pay

## 2018-03-11 NOTE — Telephone Encounter (Signed)
LMTCB and schedule AWV. Needs to be scheduled after 03/11/18. -MM

## 2018-03-18 ENCOUNTER — Ambulatory Visit: Payer: Self-pay

## 2018-03-23 ENCOUNTER — Ambulatory Visit (INDEPENDENT_AMBULATORY_CARE_PROVIDER_SITE_OTHER): Payer: Medicare Other

## 2018-03-23 DIAGNOSIS — Z86718 Personal history of other venous thrombosis and embolism: Secondary | ICD-10-CM

## 2018-03-23 LAB — POCT INR
INR: 3 (ref 2.0–3.0)
PT: 35.6

## 2018-03-23 NOTE — Patient Instructions (Signed)
Description   Dx: History of DVT Current Coumadin Dose: 5mg  daily, except 10mg  on Monday, Wednesday, Friday PT: 35.6 INR: 3.0 Today's Changes: NO CHANGE Recheck: 4 weeks

## 2018-04-07 NOTE — Telephone Encounter (Signed)
Spoke with sister in law Amy (ok per DPR) and she stated that pt is currently out of town until 04/09/18, but that she would let pts wife know we are trying to get in touch with them. Advised her there was no rush.  -MM

## 2018-04-12 ENCOUNTER — Other Ambulatory Visit: Payer: Self-pay | Admitting: Family Medicine

## 2018-04-12 ENCOUNTER — Telehealth: Payer: Self-pay | Admitting: Family Medicine

## 2018-04-12 NOTE — Telephone Encounter (Signed)
Scheduled AWV on 03/20/18 @ 10:00 AM. -MM

## 2018-04-12 NOTE — Telephone Encounter (Signed)
Pt needing to make an AWV.  Please call wife back to schedule appt.  Thanks, [employer]

## 2018-04-19 ENCOUNTER — Ambulatory Visit (INDEPENDENT_AMBULATORY_CARE_PROVIDER_SITE_OTHER): Payer: Medicare Other

## 2018-04-19 VITALS — BP 152/80 | HR 67 | Temp 98.4°F | Ht 71.0 in | Wt 219.6 lb

## 2018-04-19 DIAGNOSIS — Z1211 Encounter for screening for malignant neoplasm of colon: Secondary | ICD-10-CM | POA: Diagnosis not present

## 2018-04-19 DIAGNOSIS — Z0000 Encounter for general adult medical examination without abnormal findings: Secondary | ICD-10-CM

## 2018-04-19 NOTE — Progress Notes (Signed)
Subjective:   Chase [name] is a 67 y.o. male who presents for Medicare Annual/Subsequent preventive examination.  Review of Systems:  N/A  Cardiac Risk Factors include: advanced age (>84men, >36 women);dyslipidemia;hypertension;male gender;obesity (BMI >30kg/m2);sedentary lifestyle;smoking/ tobacco exposure     Objective:    Vitals: BP (!) 152/80 (BP Location: Right Arm)   Pulse 67   Temp 98.4 F (36.9 C) (Oral)   Ht 5\' 11"  (1.803 m)   Wt 219 lb 9.6 oz (99.6 kg)   BMI 30.63 kg/m   Body mass index is 30.63 kg/m.  Advanced Directives 04/19/2018 04/10/2017 06/30/2012 06/29/2012 01/19/2012  Does Patient Have a Medical Advance Directive? Yes Yes Patient has advance directive, copy not in chart Patient has advance directive, copy not in chart Patient has advance directive, copy not in chart  Type of Advance Directive [place];Living will - Living will Living will Living will;Healthcare Power of [place] in Chart? Yes - validated most recent copy scanned in chart (See row information) - Copy requested from family Copy requested from family -  Pre-existing out of facility DNR order (yellow form or pink MOST form) - - No No No    Tobacco Social History   Tobacco Use  Smoking Status Heavy Tobacco Smoker  . Packs/day: 2.00  . Years: 49.00  . Pack years: 98.00  . Types: Cigarettes  . Last attempt to quit: 06/13/2016  . Years since quitting: 1.8  Smokeless Tobacco Never Used  Tobacco Comment   started smoking at age 34     Ready to quit: No Counseling given: No Comment: started smoking at age 57   Clinical Intake:  Pre-visit preparation completed: Yes  Pain : No/denies pain Pain Score: 0-No pain     Nutritional Status: BMI > 30  Obese Nutritional Risks: None Diabetes: No  How often do you need to have someone help you when you read instructions, pamphlets, or other written materials from your doctor or  pharmacy?: 1 - Never  Interpreter Needed?: No  Information entered by :: [facility], LPN  Past Medical History:  Diagnosis Date  . Cerebrovascular accident (CVA) ([place]) 10/09/2011  . Clotting disorder ([place])   . DVT (deep venous thrombosis) ([place])   . Hearing deficit   . History of ischemic vertebrobasilar artery thalamic stroke 2001  . History of stroke   . Hyperlipidemia   . Hypertension   . Patent foramen ovale   . Right knee DJD   . Stroke [facility]) 2001   Past Surgical History:  Procedure Laterality Date  . APPENDECTOMY    . FRACTURE SURGERY     R leg  . INCISION AND DRAINAGE Right 06/30/2012   Procedure: INCISION AND DRAINAGE;  Surgeon: Lorn Junes, MD;  Location: [place];  Service: Orthopedics;  Laterality: Right;  . KNEE ARTHROSCOPY     right knee  . KNEE BURSECTOMY Right 06/30/2012   Procedure: KNEE BURSECTOMY;  Surgeon: Lorn Junes, MD;  Location: [place];  Service: Orthopedics;  Laterality: Right;  Pre-patellar with wound closure  . TOTAL KNEE ARTHROPLASTY  01/26/2012   Procedure: TOTAL KNEE ARTHROPLASTY;  Surgeon: Lorn Junes, MD;  Location: [place];  Service: Orthopedics;  Laterality: Right;   Family History  Problem Relation Age of Onset  . Cancer Father   . Seizures Brother    Social History   Socioeconomic History  . Marital status: Married    Spouse name: debra  .  Number of children: 0  . Years of education: 49  . Highest education level: Master's degree (e.g., MA, MS, MEng, MEd, MSW, MBA)  Occupational History  . Not on file  Social Needs  . Financial resource strain: Not hard at all  . Food insecurity:    Worry: Never true    Inability: Never true  . Transportation needs:    Medical: No    Non-medical: No  Tobacco Use  . Smoking status: Heavy Tobacco Smoker    Packs/day: 2.00    Years: 49.00    Pack years: 98.00    Types: Cigarettes    Last attempt to quit: 06/13/2016    Years since quitting: 1.8  . Smokeless tobacco: Never Used  . Tobacco  comment: started smoking at age 35  Substance and Sexual Activity  . Alcohol use: Yes    Alcohol/week: 28.0 - 42.0 standard drinks    Types: 28 - 42 Cans of beer per week    Comment: 4-6 beers a day  . Drug use: No  . Sexual activity: Not on file  Lifestyle  . Physical activity:    Days per week: 0 days    Minutes per session: 0 min  . Stress: To some extent  Relationships  . Social connections:    Talks on phone: Never    Gets together: Once a week    Attends religious service: Never    Active member of club or organization: No    Attends meetings of clubs or organizations: Never    Relationship status: Married  Other Topics Concern  . Not on file  Social History Narrative  . Not on file    Outpatient Encounter Medications as of 04/19/2018  Medication Sig  . acetaminophen (TYLENOL) 325 MG tablet Take 2 tablets (650 mg total) by mouth every 6 (six) hours as needed for pain.  Marland Kitchen amLODipine-benazepril (LOTREL) 5-20 MG capsule TAKE 1 CAPSULE EVERY DAY  . busPIRone (BUSPAR) 15 MG tablet Take 1 tablet (15 mg total) by mouth 3 (three) times daily.  . DULoxetine (CYMBALTA) 60 MG capsule Take 1 capsule (60 mg total) by mouth daily.  . fluticasone (FLONASE) 50 MCG/ACT nasal spray Place 1 spray into both nostrils as needed.   . hydrOXYzine (VISTARIL) 25 MG capsule Take 1 capsule (25 mg total) by mouth daily as needed for anxiety (for sever panic sx).  . montelukast (SINGULAIR) 10 MG tablet Take 1 tablet (10 mg total) by mouth daily. For cough, nasal drainage and allergies  . warfarin (COUMADIN) 5 MG tablet TAKE 1 OR 2 TABLETS (5 OR 10MG ) DAILY AS DIRECTED BY PHYSICIAN  . doxycycline (VIBRA-TABS) 100 MG tablet TAKE 1 TABLET BY MOUTH TWICE A DAY UNTIL FINISHED   No facility-administered encounter medications on file as of 04/19/2018.     Activities of Daily Living In your present state of health, do you have any difficulty performing the following activities: 04/19/2018  Hearing? N    Comment Has had hearing aids in the past but does not wear them currently.   Vision? N  Comment Wears eye glasses.   Difficulty concentrating or making decisions? Y  Comment Has short term memory loss.   Walking or climbing stairs? N  Dressing or bathing? N  Doing errands, shopping? N  Preparing Food and eating ? N  Using the Toilet? N  In the past six months, have you accidently leaked urine? N  Do you have problems with loss of bowel control? N  Managing your Medications? N  Managing your Finances? N  Housekeeping or managing your Housekeeping? N  Some recent data might be hidden    Patient Care Team: Birdie Sons, MD as PCP - General (Family Medicine) Ursula Alert, MD as Consulting Physician (Psychiatry)   Assessment:   This is a routine wellness examination for Chase [name].  Exercise Activities and Dietary recommendations Current Exercise Habits: The patient does not participate in regular exercise at present, Exercise limited by: None identified  Goals    . DIET - INCREASE WATER INTAKE     Recommend increasing water intake to 6-8 glasses a day. Recommend to cut back on alcohol intake and pt denied changing.    . Quit Smoking     Recommend to continue efforts to reduce smoking habits until no longer smoking.      . Reduce alcohol intake     Recommend to cut back on alcohol in take by half.       Fall Risk Fall Risk  04/19/2018 04/10/2017 07/11/2016  Falls in the past year? 0 No No  Number falls in past yr: 0 - -  Injury with Fall? 0 - -   FALL RISK PREVENTION PERTAINING TO THE HOME:  Any stairs in or around the home WITH handrails? No  Home free of loose throw rugs in walkways, pet beds, electrical cords, etc? Yes  Adequate lighting in your home to reduce risk of falls? Yes   ASSISTIVE DEVICES UTILIZED TO PREVENT FALLS:  Life alert? No  Use of a cane, walker or w/c? No  Grab bars in the bathroom? Yes  Shower chair or bench in shower? No  Elevated  toilet seat or a handicapped toilet? Yes    TIMED UP AND GO:  Was the test performed? No .     Depression Screen PHQ 2/9 Scores 04/19/2018 04/10/2017 12/18/2016 07/11/2016  PHQ - 2 Score 4 6 6 1   PHQ- 9 Score 5 15 24 4     Cognitive Function: Declined today.      6CIT Screen 04/10/2017  What Year? 4 points  What month? 0 points  What time? 0 points  Count back from 20 0 points  Months in reverse 0 points  Repeat phrase 10 points  Total Score 14    Immunization History  Administered Date(s) Administered  . Influenza Split 01/27/2012  . Influenza, High Dose Seasonal PF 12/18/2016, 01/15/2018  . Influenza,inj,Quad PF,6+ Mos 04/06/2015  . Pneumococcal Conjugate-13 12/18/2016  . Pneumococcal Polysaccharide-23 01/27/2012  . Td 11/23/2015  . Tdap 11/10/2005    Qualifies for Shingles Vaccine? Yes . Due for Shingrix. Education has been provided regarding the importance of this vaccine. Pt has been advised to call insurance company to determine out of pocket expense. Advised may also receive vaccine at local pharmacy or Health Dept. Verbalized acceptance and understanding.  Tdap: Up to date  Flu Vaccine: Up to date  Pneumococcal Vaccine: Due for Pneumococcal vaccine. Does the patient want to receive this vaccine today?  No . Education has been provided regarding the importance of this vaccine but still declined. Advised may receive this vaccine at local pharmacy or Health Dept. Aware to provide a copy of the vaccination record if obtained from local pharmacy or Health Dept. Verbalized acceptance and understanding.   Screening Tests Health Maintenance  Topic Date Due  . COLONOSCOPY  10/04/2000  . PNA vac Low Risk Adult (2 of 2 - PPSV23) 12/18/2017  . TETANUS/TDAP  11/22/2025  . INFLUENZA  VACCINE  Completed  . Hepatitis C Screening  Completed   Cancer Screenings:  Colorectal Screening: Cologuard order placed today. Pt aware of instructions.  Lung Cancer Screening: (Low  Dose CT Chest recommended if Age 63-80 years, 30 pack-year currently smoking OR have quit w/in 15years.) does qualify, however declines today.    Additional Screening:  Hepatitis C Screening: Up to date  Vision Screening: Recommended annual ophthalmology exams for early detection of glaucoma and other disorders of the eye.  Dental Screening: Recommended annual dental exams for proper oral hygiene  Community Resource Referral:  CRR required this visit?  No        Plan:  I have personally reviewed and addressed the Medicare Annual Wellness questionnaire and have noted the following in the patient's chart:  A. Medical and social history B. Use of alcohol, tobacco or illicit drugs  C. Current medications and supplements D. Functional ability and status E.  Nutritional status F.  Physical activity G. Advance directives H. List of other physicians I.  Hospitalizations, surgeries, and ER visits in previous 12 months J.  [place] such as hearing and vision if needed, cognitive and depression L. Referrals and appointments - none  In addition, I have reviewed and discussed with patient certain preventive protocols, quality metrics, and best practice recommendations. A written personalized care plan for preventive services as well as general preventive health recommendations were provided to patient.  See attached scanned questionnaire for additional information.   Signed,  Fabio Neighbors, LPN Nurse Health Advisor   Nurse Recommendations: Pt declines the Pneumovax 23 vaccine today and the lung CT scan. Cologuard order resent.

## 2018-04-19 NOTE — Patient Instructions (Addendum)
Chase [name] , Thank you for taking time to come for your Medicare Wellness Visit. I appreciate your ongoing commitment to your health goals. Please review the following plan we discussed and let me know if I can assist you in the future.   Screening recommendations/referrals: Colonoscopy: Cologuard ordered today.  Recommended yearly ophthalmology/optometry visit for glaucoma screening and checkup Recommended yearly dental visit for hygiene and checkup  Vaccinations: Influenza vaccine: Up to date Pneumococcal vaccine: Pt declines the Pneumovax 23 today.  Tdap vaccine: Up to date, due 10/2025 Shingles vaccine: Pt declines today.     Advanced directives: Currently on file.   Conditions/risks identified: Reduce alcohol intake; smoking cessation; increase water intake.   Next appointment: 04/20/18 (coumadin clinic).  Preventive Care 45 Years and Older, Male Preventive care refers to lifestyle choices and visits with your health care provider that can promote health and wellness. What does preventive care include?  A yearly physical exam. This is also called an annual well check.  Dental exams once or twice a year.  Routine eye exams. Ask your health care provider how often you should have your eyes checked.  Personal lifestyle choices, including:  Daily care of your teeth and gums.  Regular physical activity.  Eating a healthy diet.  Avoiding tobacco and drug use.  Limiting alcohol use.  Practicing safe sex.  Taking low doses of aspirin every day.  Taking vitamin and mineral supplements as recommended by your health care provider. What happens during an annual well check? The services and screenings done by your health care provider during your annual well check will depend on your age, overall health, lifestyle risk factors, and family history of disease. Counseling  Your health care provider may ask you questions about your:  Alcohol use.  Tobacco use.  Drug  use.  Emotional well-being.  Home and relationship well-being.  Sexual activity.  Eating habits.  History of falls.  Memory and ability to understand (cognition).  Work and work Statistician. Screening  You may have the following tests or measurements:  Height, weight, and BMI.  Blood pressure.  Lipid and cholesterol levels. These may be checked every 5 years, or more frequently if you are over 59 years old.  Skin check.  Lung cancer screening. You may have this screening every year starting at age 11 if you have a 30-pack-year history of smoking and currently smoke or have quit within the past 15 years.  Fecal occult blood test (FOBT) of the stool. You may have this test every year starting at age 90.  Flexible sigmoidoscopy or colonoscopy. You may have a sigmoidoscopy every 5 years or a colonoscopy every 10 years starting at age 26.  Prostate cancer screening. Recommendations will vary depending on your family history and other risks.  Hepatitis C blood test.  Hepatitis B blood test.  Sexually transmitted disease (STD) testing.  Diabetes screening. This is done by checking your blood sugar (glucose) after you have not eaten for a while (fasting). You may have this done every 1-3 years.  Abdominal aortic aneurysm (AAA) screening. You may need this if you are a current or former smoker.  Osteoporosis. You may be screened starting at age 6 if you are at high risk. Talk with your health care provider about your test results, treatment options, and if necessary, the need for more tests. Vaccines  Your health care provider may recommend certain vaccines, such as:  Influenza vaccine. This is recommended every year.  Tetanus, diphtheria, and acellular  pertussis (Tdap, Td) vaccine. You may need a Td booster every 10 years.  Zoster vaccine. You may need this after age 60.  Pneumococcal 13-valent conjugate (PCV13) vaccine. One dose is recommended after age  58.  Pneumococcal polysaccharide (PPSV23) vaccine. One dose is recommended after age 57. Talk to your health care provider about which screenings and vaccines you need and how often you need them. This information is not intended to replace advice given to you by your health care provider. Make sure you discuss any questions you have with your health care provider. Document Released: 05/11/2015 Document Revised: 01/02/2016 Document Reviewed: 02/13/2015 Elsevier Interactive Patient Education  2017 [place] Prevention in the Home Falls can cause injuries. They can happen to people of all ages. There are many things you can do to make your home safe and to help prevent falls. What can I do on the outside of my home?  Regularly fix the edges of walkways and driveways and fix any cracks.  Remove anything that might make you trip as you walk through a door, such as a raised step or threshold.  Trim any bushes or trees on the path to your home.  Use bright outdoor lighting.  Clear any walking paths of anything that might make someone trip, such as rocks or tools.  Regularly check to see if handrails are loose or broken. Make sure that both sides of any steps have handrails.  Any raised decks and porches should have guardrails on the edges.  Have any leaves, snow, or ice cleared regularly.  Use sand or salt on walking paths during winter.  Clean up any spills in your garage right away. This includes oil or grease spills. What can I do in the bathroom?  Use night lights.  Install grab bars by the toilet and in the tub and shower. Do not use towel bars as grab bars.  Use non-skid mats or decals in the tub or shower.  If you need to sit down in the shower, use a plastic, non-slip stool.  Keep the floor dry. Clean up any water that spills on the floor as soon as it happens.  Remove soap buildup in the tub or shower regularly.  Attach bath mats securely with double-sided  non-slip rug tape.  Do not have throw rugs and other things on the floor that can make you trip. What can I do in the bedroom?  Use night lights.  Make sure that you have a light by your bed that is easy to reach.  Do not use any sheets or blankets that are too big for your bed. They should not hang down onto the floor.  Have a firm chair that has side arms. You can use this for support while you get dressed.  Do not have throw rugs and other things on the floor that can make you trip. What can I do in the kitchen?  Clean up any spills right away.  Avoid walking on wet floors.  Keep items that you use a lot in easy-to-reach places.  If you need to reach something above you, use a strong step stool that has a grab bar.  Keep electrical cords out of the way.  Do not use floor polish or wax that makes floors slippery. If you must use wax, use non-skid floor wax.  Do not have throw rugs and other things on the floor that can make you trip. What can I do with my stairs?  Do not leave any items on the stairs.  Make sure that there are handrails on both sides of the stairs and use them. Fix handrails that are broken or loose. Make sure that handrails are as long as the stairways.  Check any carpeting to make sure that it is firmly attached to the stairs. Fix any carpet that is loose or worn.  Avoid having throw rugs at the top or bottom of the stairs. If you do have throw rugs, attach them to the floor with carpet tape.  Make sure that you have a light switch at the top of the stairs and the bottom of the stairs. If you do not have them, ask someone to add them for you. What else can I do to help prevent falls?  Wear shoes that:  Do not have high heels.  Have rubber bottoms.  Are comfortable and fit you well.  Are closed at the toe. Do not wear sandals.  If you use a stepladder:  Make sure that it is fully opened. Do not climb a closed stepladder.  Make sure that both  sides of the stepladder are locked into place.  Ask someone to hold it for you, if possible.  Clearly mark and make sure that you can see:  Any grab bars or handrails.  First and last steps.  Where the edge of each step is.  Use tools that help you move around (mobility aids) if they are needed. These include:  Canes.  Walkers.  Scooters.  Crutches.  Turn on the lights when you go into a dark area. Replace any light bulbs as soon as they burn out.  Set up your furniture so you have a clear path. Avoid moving your furniture around.  If any of your floors are uneven, fix them.  If there are any pets around you, be aware of where they are.  Review your medicines with your doctor. Some medicines can make you feel dizzy. This can increase your chance of falling. Ask your doctor what other things that you can do to help prevent falls. This information is not intended to replace advice given to you by your health care provider. Make sure you discuss any questions you have with your health care provider. Document Released: 02/08/2009 Document Revised: 09/20/2015 Document Reviewed: 05/19/2014 Elsevier Interactive Patient Education  2017 [employer].

## 2018-04-20 ENCOUNTER — Ambulatory Visit (INDEPENDENT_AMBULATORY_CARE_PROVIDER_SITE_OTHER): Payer: Medicare Other

## 2018-04-20 DIAGNOSIS — Z86718 Personal history of other venous thrombosis and embolism: Secondary | ICD-10-CM | POA: Diagnosis not present

## 2018-04-20 LAB — POCT INR
INR: 2.2 (ref 2.0–3.0)
PT: 26.3

## 2018-04-20 NOTE — Patient Instructions (Signed)
Description   Dx: History of DVT Current Coumadin Dose: 5mg  daily, except 10mg  on Monday, Wednesday, Friday PT: 35.6 INR: 3.0 Today's Changes: NO CHANGES Recheck: 4 weeks

## 2018-05-04 ENCOUNTER — Ambulatory Visit (INDEPENDENT_AMBULATORY_CARE_PROVIDER_SITE_OTHER): Payer: Medicare Other | Admitting: Family Medicine

## 2018-05-04 ENCOUNTER — Encounter: Payer: Self-pay | Admitting: Family Medicine

## 2018-05-04 VITALS — BP 132/76 | HR 85 | Temp 98.7°F | Resp 16 | Ht 71.0 in | Wt 215.0 lb

## 2018-05-04 DIAGNOSIS — I10 Essential (primary) hypertension: Secondary | ICD-10-CM

## 2018-05-04 DIAGNOSIS — I6990 Unspecified sequelae of unspecified cerebrovascular disease: Secondary | ICD-10-CM | POA: Diagnosis not present

## 2018-05-04 DIAGNOSIS — Z23 Encounter for immunization: Secondary | ICD-10-CM

## 2018-05-04 DIAGNOSIS — Z7901 Long term (current) use of anticoagulants: Secondary | ICD-10-CM

## 2018-05-04 DIAGNOSIS — Z125 Encounter for screening for malignant neoplasm of prostate: Secondary | ICD-10-CM | POA: Diagnosis not present

## 2018-05-04 NOTE — Patient Instructions (Addendum)
.   Please bring all of your medications to every appointment so we can make sure that our medication list is the same as yours.    You are due for Cologuard to screen for pre-cancerous colon polyps.    Please stop smoking

## 2018-05-04 NOTE — Progress Notes (Signed)
[name] Male    DOB: Oct 16, 1950   68 y.o.   MRN: [id number] Visit Date: 05/04/2018  Today's Provider: Lelon Huh, MD   Chief Complaint  Patient presents with  . Hypertension  . Depression   Subjective:     HPI  Hypertension, follow-up:  BP Readings from Last 3 Encounters:  05/04/18 132/76  04/19/18 (!) 152/80  07/27/17 140/80    He was last seen for hypertension 1 years ago.  Management since that visit includes no changes. He reports good compliance with treatment. He is not having side effects.  He is exercising. He is not adherent to low salt diet.   Outside blood pressures are not being checked. He is experiencing none.  Patient denies chest pain, chest pressure/discomfort, claudication, dyspnea, exertional chest pressure/discomfort, fatigue, irregular heart beat, lower extremity edema, near-syncope, orthopnea, palpitations, paroxysmal nocturnal dyspnea, syncope and tachypnea.   Cardiovascular risk factors include hypertension and male gender.  Use of agents associated with hypertension: none.     Weight trend: fluctuating a bit Wt Readings from Last 3 Encounters:  05/04/18 215 lb (97.5 kg)  04/19/18 219 lb 9.6 oz (99.6 kg)  07/27/17 220 lb (99.8 kg)    Current diet: in general, a "healthy" diet    ------------------------------------------------------------------------ Depression/ Anxiety: Patient was last seen for this problem 1 year ago. This problem is currently being managed by Behavioral health. Patient's last visit with Behavioral health was 2 months ago. Patient reports this problem is fairly stable.   No Known Allergies   Current Outpatient Medications:  .  acetaminophen (TYLENOL) 325 MG tablet, Take 2 tablets (650 mg total) by mouth every 6 (six) hours as needed for pain., Disp: 60 tablet, Rfl: 0 .  amLODipine-benazepril (LOTREL) 5-20 MG capsule, TAKE 1 CAPSULE EVERY DAY, Disp: 90 capsule, Rfl: 2 .  busPIRone (BUSPAR) 15  MG tablet, Take 1 tablet (15 mg total) by mouth 3 (three) times daily., Disp: 270 tablet, Rfl: 1 .  DULoxetine (CYMBALTA) 60 MG capsule, Take 1 capsule (60 mg total) by mouth daily., Disp: 90 capsule, Rfl: 1 .  fluticasone (FLONASE) 50 MCG/ACT nasal spray, Place 1 spray into both nostrils as needed. , Disp: , Rfl:  .  hydrOXYzine (VISTARIL) 25 MG capsule, Take 1 capsule (25 mg total) by mouth daily as needed for anxiety (for sever panic sx)., Disp: 90 capsule, Rfl: 1 .  montelukast (SINGULAIR) 10 MG tablet, Take 1 tablet (10 mg total) by mouth daily. For cough, nasal drainage and allergies, Disp: 90 tablet, Rfl: 4 .  warfarin (COUMADIN) 5 MG tablet, TAKE 1 OR 2 TABLETS (5 OR 10MG ) DAILY AS DIRECTED BY PHYSICIAN, Disp: 180 tablet, Rfl: 3  Review of Systems  Constitutional: Negative for appetite change, chills and fever.  Respiratory: Negative for chest tightness, shortness of breath and wheezing.   Cardiovascular: Negative for chest pain and palpitations.  Gastrointestinal: Negative for abdominal pain, nausea and vomiting.  Hematological: Bruises/bleeds easily.  Psychiatric/Behavioral: The patient is nervous/anxious.     Social History   Tobacco Use  . Smoking status: Heavy Tobacco Smoker    Packs/day: 2.00    Years: 49.00    Pack years: 98.00    Types: Cigarettes    Last attempt to quit: 06/13/2016    Years since quitting: 1.8  . Smokeless tobacco: Never Used  . Tobacco comment: started smoking at age 68  Substance Use Topics  . Alcohol use: Yes  Alcohol/week: 28.0 - 42.0 standard drinks    Types: 28 - 42 Cans of beer per week    Comment: 4-6 beers a day      Objective:   BP 132/76 (BP Location: Left Arm, Patient Position: Sitting, Cuff Size: Large)   Pulse 85   Temp 98.7 F (37.1 C) (Oral)   Resp 16   Ht 5\' 11"  (1.803 m)   Wt 215 lb (97.5 kg)   SpO2 95% Comment: room air  BMI 29.99 kg/m  Vitals:   05/04/18 1517  BP: 132/76  Pulse: 85  Resp: 16  Temp: 98.7 F  (37.1 C)  TempSrc: Oral  SpO2: 95%  Weight: 215 lb (97.5 kg)  Height: 5\' 11"  (1.803 m)     Physical Exam  General appearance: alert, well developed, well nourished, cooperative and in no distress Head: Normocephalic, without obvious abnormality, atraumatic Respiratory: Respirations even and unlabored, normal respiratory rate Extremities: No gross deformities Skin: Skin color, texture, turgor normal. No rashes seen  Psych: Appropriate mood and affect. Neurologic: Mental status: Alert, oriented to person, place, and time, thought content appropriate.     Assessment & Plan    1. Essential hypertension Well controlled.  Continue current medications.   - Comprehensive metabolic panel - Lipid panel  2. Cerebrovascular accident, late effects Stable.  - Comprehensive metabolic panel - Lipid panel  3. Anticoagulant long-term use Tolerating warfarin with no adverse effects.  - CBC  4. Prostate cancer screening  - PSA  5. Need for pneumococcal vaccination  - Pneumococcal polysaccharide vaccine 23-valent greater than or equal to 2yo subcutaneous/IM     Lelon Huh, MD  [facility]

## 2018-05-05 LAB — COMPREHENSIVE METABOLIC PANEL
ALBUMIN: 4.3 g/dL (ref 3.6–4.8)
ALK PHOS: 48 IU/L (ref 39–117)
ALT: 18 IU/L (ref 0–44)
AST: 20 IU/L (ref 0–40)
Albumin/Globulin Ratio: 2 (ref 1.2–2.2)
BUN / CREAT RATIO: 12 (ref 10–24)
BUN: 14 mg/dL (ref 8–27)
Bilirubin Total: 0.3 mg/dL (ref 0.0–1.2)
CHLORIDE: 103 mmol/L (ref 96–106)
CO2: 21 mmol/L (ref 20–29)
CREATININE: 1.19 mg/dL (ref 0.76–1.27)
Calcium: 9 mg/dL (ref 8.6–10.2)
GFR calc Af Amer: 73 mL/min/{1.73_m2} (ref >59)
GFR calc non Af Amer: 63 mL/min/{1.73_m2} (ref >59)
GLUCOSE: 84 mg/dL (ref 65–99)
Globulin, Total: 2.2 g/dL (ref 1.5–4.5)
Potassium: 4.3 mmol/L (ref 3.5–5.2)
Sodium: 140 mmol/L (ref 134–144)
Total Protein: 6.5 g/dL (ref 6.0–8.5)

## 2018-05-05 LAB — LIPID PANEL
CHOL/HDL RATIO: 2.8 ratio (ref 0.0–5.0)
Cholesterol, Total: 173 mg/dL (ref 100–199)
HDL: 61 mg/dL (ref >39)
LDL CALC: 97 mg/dL (ref 0–99)
Triglycerides: 76 mg/dL (ref 0–149)
VLDL CHOLESTEROL CAL: 15 mg/dL (ref 5–40)

## 2018-05-05 LAB — CBC
Hematocrit: 39.6 % (ref 37.5–51.0)
Hemoglobin: 13.7 g/dL (ref 13.0–17.7)
MCH: 32.8 pg (ref 26.6–33.0)
MCHC: 34.6 g/dL (ref 31.5–35.7)
MCV: 95 fL (ref 79–97)
Platelets: 195 10*3/uL (ref 150–450)
RBC: 4.18 x10E6/uL (ref 4.14–5.80)
RDW: 13.1 % (ref 11.6–15.4)
WBC: 8.3 10*3/uL (ref 3.4–10.8)

## 2018-05-05 LAB — PSA: PROSTATE SPECIFIC AG, SERUM: 1.1 ng/mL (ref 0.0–4.0)

## 2018-05-18 ENCOUNTER — Ambulatory Visit (INDEPENDENT_AMBULATORY_CARE_PROVIDER_SITE_OTHER): Payer: Medicare Other

## 2018-05-18 DIAGNOSIS — Z86718 Personal history of other venous thrombosis and embolism: Secondary | ICD-10-CM

## 2018-05-18 LAB — POCT INR
INR: 3.1 — AB (ref 2.0–3.0)
PT: 37.6

## 2018-05-18 NOTE — Patient Instructions (Signed)
Description   Dx: History of DVT Current Coumadin Dose: 5mg  daily, except 10mg  on Monday, Wednesday, Friday PT: 37.6 INR: 3.1 Today's Changes: hold for one (1) day, than continue current dosage. Recheck: 3 weeks

## 2018-05-24 ENCOUNTER — Other Ambulatory Visit: Payer: Self-pay

## 2018-05-24 ENCOUNTER — Encounter: Payer: Self-pay | Admitting: Psychiatry

## 2018-05-24 ENCOUNTER — Ambulatory Visit (INDEPENDENT_AMBULATORY_CARE_PROVIDER_SITE_OTHER): Payer: Medicare Other | Admitting: Psychiatry

## 2018-05-24 VITALS — BP 148/83 | HR 76 | Temp 98.6°F | Wt 213.2 lb

## 2018-05-24 DIAGNOSIS — F09 Unspecified mental disorder due to known physiological condition: Secondary | ICD-10-CM

## 2018-05-24 DIAGNOSIS — F17200 Nicotine dependence, unspecified, uncomplicated: Secondary | ICD-10-CM | POA: Diagnosis not present

## 2018-05-24 DIAGNOSIS — F410 Panic disorder [episodic paroxysmal anxiety] without agoraphobia: Secondary | ICD-10-CM | POA: Diagnosis not present

## 2018-05-24 DIAGNOSIS — F0631 Mood disorder due to known physiological condition with depressive features: Secondary | ICD-10-CM

## 2018-05-24 DIAGNOSIS — F1010 Alcohol abuse, uncomplicated: Secondary | ICD-10-CM | POA: Diagnosis not present

## 2018-05-24 MED ORDER — BUSPIRONE HCL 30 MG PO TABS: 30.0000 mg | ORAL_TABLET | Freq: Two times a day (BID) | ORAL | 1 refills | Status: DC

## 2018-05-24 MED ORDER — DULOXETINE HCL 60 MG PO CPEP: 60.0000 mg | ORAL_CAPSULE | Freq: Every day | ORAL | 1 refills | Status: DC

## 2018-05-24 NOTE — Progress Notes (Signed)
[facility] MD OP Progress Note  05/24/2018 4:27 PM Chase [name]  MRN:  [id number]  Chief Complaint: ' I am here for follow up." Chief Complaint    Follow-up; Medication Refill     HPI: Chase [name] is a 68 year old Caucasian male, has a history of depression, panic attacks, cognitive disorder, alcohol use disorder, tobacco use disorder, presented to the clinic today for a follow-up visit.  Patient today reports that he continues to struggle with mild anxiety symptoms especially when he wakes up in the morning.  He denies any sadness, crying spells, lack of motivation.  He reports appetite is good.  He reports sleep is fair.  He denies any significant panic attacks.  He reports he enjoyed his holidays and was able to travel.  Patient has been able to take a projects and completed without problems.  He continues to be in therapy with [name] which is going well.  He reports he continues to drink 4 -5 beers every day.  He does not want to cut down since he feels like if he cuts down there is no point in drinking anymore.  He reports he wants to continue to do that and does not want to make changes.  Some time was spent providing substance abuse counseling.  Discussed with him the interaction between alcoholism as well as his mood symptoms.  Discussed with him to talk to his therapist about the same.  Received collateral information from his wife [name] - ' Per wife who sent in a letter regarding patient's status- patient is doing better.  He is not having panic attacks where his heart races or has trouble breathing or sweating profusely anymore.  He does wake up anxious sometimes especially this morning but that is likely due to him going in for his appointment.  Last week patient was able to build a firewood shelter and had no panic attacks.  In my opinion medications are helpful even though patient thinks he should not have any anxiety at all.'     Visit Diagnosis:    ICD-10-CM   1.  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 Panic attacks F41.0 DULoxetine (CYMBALTA) 60 MG capsule    busPIRone (BUSPAR) 30 MG tablet  3. Tobacco use disorder F17.200   4. Alcohol use disorder, mild, abuse F10.10   5. Mild cognitive disorder F09     Past Psychiatric History: Reviewed past psychiatric history from my progress note on 08/10/2017.  Past trials of Effexor, Wellbutrin, Cymbalta, Xanax, Klonopin.     Past Medical History:  Past Medical History:  Diagnosis Date  . Cerebrovascular accident (CVA) ([place]) 10/09/2011  . Clotting disorder ([place])   . DVT (deep venous thrombosis) ([place])   . Hearing deficit   . History of ischemic vertebrobasilar artery thalamic stroke 2001  . History of stroke   . Hyperlipidemia   . Hypertension   . Patent foramen ovale   . Right knee DJD   . Stroke [facility]) 2001    Past Surgical History:  Procedure Laterality Date  . APPENDECTOMY    . FRACTURE SURGERY     R leg  . INCISION AND DRAINAGE Right 06/30/2012   Procedure: INCISION AND DRAINAGE;  Surgeon: Lorn Junes, MD;  Location: [facility];  Service: Orthopedics;  Laterality: Right;  . KNEE ARTHROSCOPY     right knee  . KNEE BURSECTOMY Right 06/30/2012   Procedure: KNEE BURSECTOMY;  Surgeon: Lorn Junes, MD;  Location: [facility];  Service: Orthopedics;  Laterality: Right;  Pre-patellar with wound closure  . TOTAL KNEE ARTHROPLASTY  01/26/2012   Procedure: TOTAL KNEE ARTHROPLASTY;  Surgeon: Lorn Junes, MD;  Location: [facility];  Service: Orthopedics;  Laterality: Right;    Family Psychiatric History: Reviewed family psychiatric history from my progress note on 08/10/2017.  Family History:  Family History  Problem Relation Age of Onset  . Cancer Father   . Seizures Brother     Social History: Reviewed social history from my progress note on 08/10/2017. Social History   Socioeconomic History  . Marital status: Married    Spouse  name: debra  . Number of children: 0  . Years of education: 6  . Highest education level: Master's degree (e.g., MA, MS, MEng, MEd, MSW, MBA)  Occupational History  . Not on file  Social Needs  . Financial resource strain: Not hard at all  . Food insecurity:    Worry: Never true    Inability: Never true  . Transportation needs:    Medical: No    Non-medical: No  Tobacco Use  . Smoking status: Heavy Tobacco Smoker    Packs/day: 2.00    Years: 49.00    Pack years: 98.00    Types: Cigarettes    Last attempt to quit: 06/13/2016    Years since quitting: 1.9  . Smokeless tobacco: Never Used  . Tobacco comment: started smoking at age 68  Substance and Sexual Activity  . Alcohol use: Yes    Alcohol/week: 28.0 - 42.0 standard drinks    Types: 28 - 42 Cans of beer per week    Comment: 4-6 beers a day  . Drug use: No  . Sexual activity: Not on file  Lifestyle  . Physical activity:    Days per week: 0 days    Minutes per session: 0 min  . Stress: To some extent  Relationships  . Social connections:    Talks on phone: Never    Gets together: Once a week    Attends religious service: Never    Active member of club or organization: No    Attends meetings of clubs or organizations: Never    Relationship status: Married  Other Topics Concern  . Not on file  Social History Narrative  . Not on file    Allergies: No Known Allergies  Metabolic Disorder Labs: No results found for: HGBA1C, MPG No results found for: PROLACTIN Lab Results  Component Value Date   CHOL 173 05/04/2018   TRIG 76 05/04/2018   HDL 61 05/04/2018   CHOLHDL 2.8 05/04/2018   LDLCALC 97 05/04/2018   LDLCALC 105 (H) 07/11/2016   Lab Results  Component Value Date   TSH 2.25 03/13/2017    Therapeutic Level Labs: No results found for: LITHIUM No results found for: VALPROATE No components found for:  CBMZ  Current Medications: Current Outpatient Medications  Medication Sig Dispense Refill  .  acetaminophen (TYLENOL) 325 MG tablet Take 2 tablets (650 mg total) by mouth every 6 (six) hours as needed for pain. 60 tablet 0  . amLODipine-benazepril (LOTREL) 5-20 MG capsule TAKE 1 CAPSULE EVERY DAY 90 capsule 2  . busPIRone (BUSPAR) 30 MG tablet Take 1 tablet (30 mg total) by mouth 2 (two) times daily. 180 tablet 1  . DULoxetine (CYMBALTA) 60 MG capsule Take 1 capsule (60 mg total) by mouth daily. 90 capsule 1  . fluticasone (FLONASE) 50  MCG/ACT nasal spray Place 1 spray into both nostrils as needed.     . hydrOXYzine (VISTARIL) 25 MG capsule Take 1 capsule (25 mg total) by mouth daily as needed for anxiety (for sever panic sx). 90 capsule 1  . montelukast (SINGULAIR) 10 MG tablet Take 1 tablet (10 mg total) by mouth daily. For cough, nasal drainage and allergies 90 tablet 4  . warfarin (COUMADIN) 5 MG tablet TAKE 1 OR 2 TABLETS (5 OR 10MG ) DAILY AS DIRECTED BY PHYSICIAN 180 tablet 3   No current facility-administered medications for this visit.      Musculoskeletal: Strength & Muscle Tone: within normal limits Gait & Station: normal Patient leans: N/A  Psychiatric Specialty Exam: Review of Systems  Psychiatric/Behavioral: Positive for substance abuse. The patient is nervous/anxious.   All other systems reviewed and are negative.   Blood pressure (!) 148/83, pulse 76, temperature 98.6 F (37 C), temperature source Oral, weight 213 lb 3.2 oz (96.7 kg).Body mass index is 29.74 kg/m.  General Appearance: Casual  Eye Contact:  Fair  Speech:  Clear and Coherent  Volume:  Normal  Mood:  Anxious  Affect:  Appropriate  Thought Process:  Goal Directed and Descriptions of Associations: Intact  Orientation:  Other:  self, situation  Thought Content: Logical   Suicidal Thoughts:  No  Homicidal Thoughts:  No  Memory:  Immediate;   Fair Recent;   baseline Remote;   Poor  Judgement:  Other:  limited  Insight:  Fair  Psychomotor Activity:  Normal  Concentration:  Concentration: Fair  and Attention Span: Fair  Recall:  [employer] of Knowledge: Fair  Language: Fair  Akathisia:  No  Handed:  Right  AIMS (if indicated): denies tremors,rigidity,stiffness  Assets:  Communication Skills Desire for Improvement Social Support  ADL's:  Intact  Cognition: Impaired,  Mild  Sleep:  Fair   Screenings: PHQ2-9     Office Visit from 05/04/2018 in [facility] from 04/19/2018 in [facility] from 04/10/2017 in [facility] Visit from 12/18/2016 in [facility] [employer] Visit from 07/11/2016 in [facility]  PHQ-2 Total Score  2  4  6  6  1   PHQ-9 Total Score  3  5  15  24  4        Assessment and Plan: Chase [name] is a 68 year old Caucasian male, has a history of depression, panic attacks, cognitive impairment, alcohol use disorder, presented to the clinic today for a follow-up visit.  Patient has a history of left-sided thalamic stroke, cognitive impairment.  Patient continues to make progress even though does report mild anxiety on and off.  Patient continues to have coexisting substance abuse problems-alcoholism.  Patient will benefit from continued medication changes as well as psychotherapy sessions.  Plan Depressive disorder due to another medical condition left-sided stroke Continue Cymbalta 60 mg p.o. daily Continue BuSpar as prescribed Continue CBT with [name].  Panic attacks- improving Increase BuSpar to 30 mg p.o. twice daily-even though improving patient continues to have residual anxiety symptoms and would like medication changes today. Hydroxyzine 25 mg p.o. daily PRN for severe anxiety attacks Continue psychotherapy  For tobacco use disorder- unstable Provided smoking cessation counseling  For alcohol use disorder-unstable Provided substance abuse counseling Also discussed with him to reach out to his therapist for continued support.  Obtained  collateral information from wife [name] as summarized above.  As per wife patient has made a lot of  progress however patient believes he should not have any anxiety at all.  Follow-up in clinic in 2 to 3 months or sooner if needed.  I have spent atleast 15 minutes face to face with patient today. More than 50 % of the time was spent for psychoeducation and supportive psychotherapy and care coordination.  This note was generated in part or whole with voice recognition software. Voice recognition is usually quite accurate but there are transcription errors that can and very often do occur. I apologize for any typographical errors that were not detected and corrected.        Ursula Alert, MD 05/24/2018, 4:27 PM

## 2018-05-27 ENCOUNTER — Other Ambulatory Visit: Payer: Self-pay | Admitting: Psychiatry

## 2018-05-27 DIAGNOSIS — F0631 Mood disorder due to known physiological condition with depressive features: Secondary | ICD-10-CM

## 2018-05-27 DIAGNOSIS — F410 Panic disorder [episodic paroxysmal anxiety] without agoraphobia: Secondary | ICD-10-CM

## 2018-05-27 MED ORDER — HYDROXYZINE PAMOATE 25 MG PO CAPS: 25.0000 mg | ORAL_CAPSULE | Freq: Every day | ORAL | 1 refills | Status: DC | PRN

## 2018-05-27 NOTE — Telephone Encounter (Signed)
Sent vistaril to pharmacy. 

## 2018-06-08 ENCOUNTER — Ambulatory Visit (INDEPENDENT_AMBULATORY_CARE_PROVIDER_SITE_OTHER): Payer: Medicare Other

## 2018-06-08 DIAGNOSIS — Z86718 Personal history of other venous thrombosis and embolism: Secondary | ICD-10-CM

## 2018-06-08 LAB — POCT INR
INR: 2.2 (ref 2.0–3.0)
PT: 26.3

## 2018-06-08 NOTE — Patient Instructions (Signed)
Description   Dx: History of DVT Current Coumadin Dose: 5mg  daily, except 10mg  on Monday, Wednesday, Friday PT: 26.3 INR: 2.2 Today's Changes: none Recheck: 4 weeks

## 2018-07-06 ENCOUNTER — Ambulatory Visit (INDEPENDENT_AMBULATORY_CARE_PROVIDER_SITE_OTHER): Payer: Medicare Other

## 2018-07-06 DIAGNOSIS — Z86718 Personal history of other venous thrombosis and embolism: Secondary | ICD-10-CM

## 2018-07-06 LAB — POCT INR
INR: 1.9 — AB (ref 2.0–3.0)
PT: 23

## 2018-07-06 NOTE — Patient Instructions (Signed)
Description   Dx: History of DVT Current Coumadin Dose: 5mg  daily, except 10mg  on Monday, Wednesday, Friday PT: 23.0 INR: 1.9 Today's Changes: none Recheck: 4 weeks

## 2018-07-09 DIAGNOSIS — D2261 Melanocytic nevi of right upper limb, including shoulder: Secondary | ICD-10-CM | POA: Diagnosis not present

## 2018-07-09 DIAGNOSIS — L720 Epidermal cyst: Secondary | ICD-10-CM | POA: Diagnosis not present

## 2018-07-09 DIAGNOSIS — D2272 Melanocytic nevi of left lower limb, including hip: Secondary | ICD-10-CM | POA: Diagnosis not present

## 2018-07-09 DIAGNOSIS — D485 Neoplasm of uncertain behavior of skin: Secondary | ICD-10-CM | POA: Diagnosis not present

## 2018-07-09 DIAGNOSIS — D2271 Melanocytic nevi of right lower limb, including hip: Secondary | ICD-10-CM | POA: Diagnosis not present

## 2018-07-09 DIAGNOSIS — D225 Melanocytic nevi of trunk: Secondary | ICD-10-CM | POA: Diagnosis not present

## 2018-07-09 DIAGNOSIS — L821 Other seborrheic keratosis: Secondary | ICD-10-CM | POA: Diagnosis not present

## 2018-07-09 DIAGNOSIS — L570 Actinic keratosis: Secondary | ICD-10-CM | POA: Diagnosis not present

## 2018-07-09 DIAGNOSIS — D2262 Melanocytic nevi of left upper limb, including shoulder: Secondary | ICD-10-CM | POA: Diagnosis not present

## 2018-08-03 ENCOUNTER — Ambulatory Visit: Payer: Self-pay

## 2018-08-23 ENCOUNTER — Ambulatory Visit (INDEPENDENT_AMBULATORY_CARE_PROVIDER_SITE_OTHER): Payer: Medicare Other | Admitting: Psychiatry

## 2018-08-23 ENCOUNTER — Other Ambulatory Visit: Payer: Self-pay

## 2018-08-23 ENCOUNTER — Encounter: Payer: Self-pay | Admitting: Psychiatry

## 2018-08-23 DIAGNOSIS — F0631 Mood disorder due to known physiological condition with depressive features: Secondary | ICD-10-CM | POA: Diagnosis not present

## 2018-08-23 DIAGNOSIS — F17200 Nicotine dependence, unspecified, uncomplicated: Secondary | ICD-10-CM | POA: Diagnosis not present

## 2018-08-23 DIAGNOSIS — F09 Unspecified mental disorder due to known physiological condition: Secondary | ICD-10-CM

## 2018-08-23 DIAGNOSIS — F1010 Alcohol abuse, uncomplicated: Secondary | ICD-10-CM | POA: Diagnosis not present

## 2018-08-23 DIAGNOSIS — F410 Panic disorder [episodic paroxysmal anxiety] without agoraphobia: Secondary | ICD-10-CM | POA: Diagnosis not present

## 2018-08-23 MED ORDER — BUSPIRONE HCL 30 MG PO TABS: 30.0000 mg | ORAL_TABLET | Freq: Two times a day (BID) | ORAL | 1 refills | Status: DC

## 2018-08-23 NOTE — Progress Notes (Signed)
Virtual Visit via Video Note  I connected with Chase [name] on 08/23/18 at  4:00 PM EDT by a video enabled telemedicine application and verified that I am speaking with the correct person using two identifiers.   I discussed the limitations of evaluation and management by telemedicine and the availability of in person appointments. The patient expressed understanding and agreed to proceed.    I discussed the assessment and treatment plan with the patient. The patient was provided an opportunity to ask questions and all were answered. The patient agreed with the plan and demonstrated an understanding of the instructions.   The patient was advised to call back or seek an in-person evaluation if the symptoms worsen or if the condition fails to improve as anticipated.  [place] MD  OP Progress Note  08/23/2018 4:50 PM Chase [name]  MRN:  [id number]  Chief Complaint:  Chief Complaint    Follow-up     HPI: Chase [name] is a 68 year old Caucasian male, has a history of depression, panic attacks, cognitive disorder, alcohol use disorder, tobacco use disorder was evaluated by telemedicine today.  Patient's wife Chase [name] provided collateral information.  Patient reports he has been doing well on the current medication regimen.  Patient reports he continues to struggle with some panic symptoms especially in the a.m. as soon as he wakes up.  He reports if he is able to get out of bed as soon as he wakes up he does not have a lot of panic symptoms.  He reports as soon as he has panic symptoms he starts working on his breathing and relaxation techniques and that has been helpful.  He reports therapy sessions with Chase [name] were really helpful.  Patient reports that he was advised to work on techniques that he learned and to go back as needed.  Patient reports sleep is good.  Patient reports he continues to drink alcohol 4-6 beer per day.  There are days when he drinks only 4, other days especially on  weekends he drinks a couple more.  Continues to be hesitant to work on cutting down.  Patient continues to have good social support system from his wife who believes patient is doing well.  Patient is alert, oriented to person place and situation.  He continues to struggle with short-term memory loss which is part of his cognitive disorder.  Patient denies any other concerns today. Visit Diagnosis:    ICD-10-CM   1. Depressive disorder due to another medical condition with depressive features F06.31   2. Panic attacks F41.0 busPIRone (BUSPAR) 30 MG tablet  3. Tobacco use disorder F17.200   4. Alcohol use disorder, mild, abuse F10.10   5. Mild cognitive disorder F09     Past Psychiatric History: Reviewed past psychiatric history from my progress note on 08/10/2017.  Past trials of Effexor, Wellbutrin, Cymbalta, Xanax, Klonopin.  Past Medical History:  Past Medical History:  Diagnosis Date  . Cerebrovascular accident (CVA) ([place]) 10/09/2011  . Clotting disorder ([place])   . DVT (deep venous thrombosis) ([place])   . Hearing deficit   . History of ischemic vertebrobasilar artery thalamic stroke 2001  . History of stroke   . Hyperlipidemia   . Hypertension   . Patent foramen ovale   . Right knee DJD   . Stroke [facility]) 2001    Past Surgical History:  Procedure Laterality Date  . APPENDECTOMY    . FRACTURE SURGERY     R leg  . INCISION AND DRAINAGE  Right 06/30/2012   Procedure: INCISION AND DRAINAGE;  Surgeon: Lorn Junes, MD;  Location: [place];  Service: Orthopedics;  Laterality: Right;  . KNEE ARTHROSCOPY     right knee  . KNEE BURSECTOMY Right 06/30/2012   Procedure: KNEE BURSECTOMY;  Surgeon: Lorn Junes, MD;  Location: [place];  Service: Orthopedics;  Laterality: Right;  Pre-patellar with wound closure  . TOTAL KNEE ARTHROPLASTY  01/26/2012   Procedure: TOTAL KNEE ARTHROPLASTY;  Surgeon: Lorn Junes, MD;  Location: [place];  Service: Orthopedics;  Laterality: Right;    Family  Psychiatric History: Reviewed family psychiatric history from my progress note on 08/10/2017.  Past trials of Effexor, Wellbutrin, Cymbalta, Xanax, Klonopin.  Family History:  Family History  Problem Relation Age of Onset  . Cancer Father   . Seizures Brother     Social History: I have reviewed social history from my progress note on 08/10/2017. Social History   Socioeconomic History  . Marital status: Married    Spouse name: Chase [name]  . Number of children: 0  . Years of education: 28  . Highest education level: Master's degree (e.g., MA, MS, MEng, MEd, MSW, MBA)  Occupational History  . Not on file  Social Needs  . Financial resource strain: Not hard at all  . Food insecurity:    Worry: Never true    Inability: Never true  . Transportation needs:    Medical: No    Non-medical: No  Tobacco Use  . Smoking status: Heavy Tobacco Smoker    Packs/day: 2.00    Years: 49.00    Pack years: 98.00    Types: Cigarettes    Last attempt to quit: 06/13/2016    Years since quitting: 2.1  . Smokeless tobacco: Never Used  . Tobacco comment: started smoking at age 42  Substance and Sexual Activity  . Alcohol use: Yes    Alcohol/week: 28.0 - 42.0 standard drinks    Types: 28 - 42 Cans of beer per week    Comment: 4-6 beers a day  . Drug use: No  . Sexual activity: Not on file  Lifestyle  . Physical activity:    Days per week: 0 days    Minutes per session: 0 min  . Stress: To some extent  Relationships  . Social connections:    Talks on phone: Never    Gets together: Once a week    Attends religious service: Never    Active member of club or organization: No    Attends meetings of clubs or organizations: Never    Relationship status: Married  Other Topics Concern  . Not on file  Social History Narrative  . Not on file    Allergies: No Known Allergies  Metabolic Disorder Labs: No results found for: HGBA1C, MPG No results found for: PROLACTIN Lab Results  Component Value  Date   CHOL 173 05/04/2018   TRIG 76 05/04/2018   HDL 61 05/04/2018   CHOLHDL 2.8 05/04/2018   LDLCALC 97 05/04/2018   LDLCALC 105 (H) 07/11/2016   Lab Results  Component Value Date   TSH 2.25 03/13/2017    Therapeutic Level Labs: No results found for: LITHIUM No results found for: VALPROATE No components found for:  CBMZ  Current Medications: Current Outpatient Medications  Medication Sig Dispense Refill  . acetaminophen (TYLENOL) 325 MG tablet Take 2 tablets (650 mg total) by mouth every 6 (six) hours as needed for pain. 60 tablet 0  . amLODipine-benazepril (LOTREL)  5-20 MG capsule TAKE 1 CAPSULE EVERY DAY 90 capsule 2  . busPIRone (BUSPAR) 30 MG tablet Take 1 tablet (30 mg total) by mouth 2 (two) times daily. 180 tablet 1  . DULoxetine (CYMBALTA) 60 MG capsule Take 1 capsule (60 mg total) by mouth daily. 90 capsule 1  . fluticasone (FLONASE) 50 MCG/ACT nasal spray Place 1 spray into both nostrils as needed.     . hydrOXYzine (VISTARIL) 25 MG capsule Take 1 capsule (25 mg total) by mouth daily as needed for anxiety (for sever panic sx). 90 capsule 1  . montelukast (SINGULAIR) 10 MG tablet Take 1 tablet (10 mg total) by mouth daily. For cough, nasal drainage and allergies 90 tablet 4  . warfarin (COUMADIN) 5 MG tablet TAKE 1 OR 2 TABLETS (5 OR 10MG ) DAILY AS DIRECTED BY PHYSICIAN 180 tablet 3   No current facility-administered medications for this visit.      Musculoskeletal: Strength & Muscle Tone: UTA Gait & Station: normal Patient leans: N/A  Psychiatric Specialty Exam: Review of Systems  Psychiatric/Behavioral: Negative for depression. The patient is not nervous/anxious.   All other systems reviewed and are negative.   There were no vitals taken for this visit.There is no height or weight on file to calculate BMI.  General Appearance: Casual  Eye Contact:  Fair  Speech:  Clear and Coherent  Volume:  Normal  Mood:  Euthymic  Affect:  Congruent  Thought Process:   Goal Directed and Descriptions of Associations: Intact  Orientation:  Other:  Month,date,situation  Thought Content: Logical   Suicidal Thoughts:  No  Homicidal Thoughts:  No  Memory:  Immediate;   Fair Recent;   limited Remote;   limited  Judgement:  Fair  Insight:  Fair  Psychomotor Activity:  Normal  Concentration:  Concentration: Fair and Attention Span: Fair  Recall:  [employer] of Knowledge: Fair  Language: Fair  Akathisia:  No  Handed:  Right  AIMS (if indicated): UTA  Assets:  Communication Skills Desire for Improvement Housing Intimacy Social Support Talents/Skills Transportation  ADL's:  Intact  Cognition: WNL  Sleep:  Fair   Screenings: PHQ2-9     Office Visit from 05/04/2018 in [place] from 04/19/2018 in [place] from 04/10/2017 in [place] Visit from 12/18/2016 in [place] Visit from 07/11/2016 in [place]  PHQ-2 Total Score  2  4  6  6  1   PHQ-9 Total Score  3  5  15  24  4        Assessment and Plan: Chase [name] is a 68 year old Caucasian male has a history of depression, panic attacks, cognitive impairment, alcohol use disorder was evaluated by telemedicine today.  Patient has a history of left-sided thalamic stroke, cognitive impairment.  Patient is currently making progress on the current medication regimen.  He continues to have coexisting substance abuse problem-alcoholism .  Discussed plan as noted below.  Plan Depressive disorder due to another medical condition left-sided stroke- stable Cymbalta 60 mg p.o. daily BuSpar as prescribed Continue CBT with Chase [name] as needed.  Panic attacks-improving BuSpar 30 mg p.o. twice daily. Hydroxyzine 25 mg daily PRN for severe anxiety attacks  Tobacco use disorder-patient is not ready to quit.  For alcohol use disorder-unstable Patient provided with substance abuse  counseling.  Obtained collateral information from wife Chase [name] as summarized above.  Follow-up in clinic in 3 months or sooner if needed.  I have spent atleast 15 minutes non- face to face with patient today. More than 50 % of the time was spent for psychoeducation and supportive psychotherapy and care coordination.  This note was generated in part or whole with voice recognition software. Voice recognition is usually quite accurate but there are transcription errors that can and very often do occur. I apologize for any typographical errors that were not detected and corrected.      Ursula Alert, MD 08/23/2018, 4:49 PM

## 2018-09-03 ENCOUNTER — Other Ambulatory Visit: Payer: Self-pay | Admitting: Family Medicine

## 2018-09-14 ENCOUNTER — Ambulatory Visit (INDEPENDENT_AMBULATORY_CARE_PROVIDER_SITE_OTHER): Payer: Medicare Other

## 2018-09-14 ENCOUNTER — Other Ambulatory Visit: Payer: Self-pay

## 2018-09-14 DIAGNOSIS — Z86718 Personal history of other venous thrombosis and embolism: Secondary | ICD-10-CM

## 2018-09-14 LAB — POCT INR
INR: 2 (ref 2.0–3.0)
PT: 24

## 2018-09-14 NOTE — Patient Instructions (Signed)
Description   Dx: History of DVT Current Coumadin Dose: 5mg  daily, except 10mg  on Monday, Wednesday, Friday PT: 24.0 INR: 2.0 Today's Changes: none Recheck: 4 weeks

## 2018-10-12 ENCOUNTER — Other Ambulatory Visit: Payer: Self-pay

## 2018-10-12 ENCOUNTER — Ambulatory Visit (INDEPENDENT_AMBULATORY_CARE_PROVIDER_SITE_OTHER): Payer: Medicare Other

## 2018-10-12 DIAGNOSIS — Z86718 Personal history of other venous thrombosis and embolism: Secondary | ICD-10-CM | POA: Diagnosis not present

## 2018-10-12 LAB — POCT INR
INR: 1.6 — AB (ref 2.0–3.0)
PT: 19.6

## 2018-10-12 NOTE — Patient Instructions (Signed)
Description   Dx: History of DVT Current Coumadin Dose: 5mg  daily, except 10mg  on Monday, Wednesday, Friday PT: 19.6 INR: 1.6 Today's Changes: none Recheck: 2 weeks

## 2018-10-26 ENCOUNTER — Other Ambulatory Visit: Payer: Self-pay

## 2018-10-26 ENCOUNTER — Ambulatory Visit (INDEPENDENT_AMBULATORY_CARE_PROVIDER_SITE_OTHER): Payer: Medicare Other

## 2018-10-26 DIAGNOSIS — Z86718 Personal history of other venous thrombosis and embolism: Secondary | ICD-10-CM | POA: Diagnosis not present

## 2018-10-26 LAB — POCT INR
INR: 2 (ref 2.0–3.0)
PT: 23.8

## 2018-10-26 NOTE — Patient Instructions (Signed)
Description   Dx: History of DVT Current Coumadin Dose: 5mg  daily, except 10mg  on Monday, Wednesday, Friday PT: 23.8 INR: 2.0 Today's Changes: none Recheck: 4 weeks

## 2018-11-09 ENCOUNTER — Other Ambulatory Visit: Payer: Self-pay | Admitting: Family Medicine

## 2018-11-09 DIAGNOSIS — J301 Allergic rhinitis due to pollen: Secondary | ICD-10-CM

## 2018-11-12 ENCOUNTER — Other Ambulatory Visit: Payer: Self-pay | Admitting: Family Medicine

## 2018-11-23 ENCOUNTER — Other Ambulatory Visit: Payer: Self-pay

## 2018-11-23 ENCOUNTER — Ambulatory Visit (INDEPENDENT_AMBULATORY_CARE_PROVIDER_SITE_OTHER): Payer: Medicare Other

## 2018-11-23 DIAGNOSIS — Z86718 Personal history of other venous thrombosis and embolism: Secondary | ICD-10-CM

## 2018-11-23 LAB — POCT INR
INR: 2.5 (ref 2.0–3.0)
Prothrombin Time: 29.5

## 2018-11-23 NOTE — Patient Instructions (Signed)
Description   Current Coumadin Dose: 5mg  daily, except 10mg  on Monday, Wednesday, Friday Today's Changes: none Recheck: 4 weeks

## 2018-11-24 ENCOUNTER — Ambulatory Visit (INDEPENDENT_AMBULATORY_CARE_PROVIDER_SITE_OTHER): Payer: Medicare Other | Admitting: Psychiatry

## 2018-11-24 ENCOUNTER — Telehealth: Payer: Self-pay

## 2018-11-24 ENCOUNTER — Encounter: Payer: Self-pay | Admitting: Psychiatry

## 2018-11-24 DIAGNOSIS — F0631 Mood disorder due to known physiological condition with depressive features: Secondary | ICD-10-CM

## 2018-11-24 DIAGNOSIS — F410 Panic disorder [episodic paroxysmal anxiety] without agoraphobia: Secondary | ICD-10-CM | POA: Diagnosis not present

## 2018-11-24 DIAGNOSIS — F17200 Nicotine dependence, unspecified, uncomplicated: Secondary | ICD-10-CM | POA: Diagnosis not present

## 2018-11-24 MED ORDER — HYDROXYZINE PAMOATE 25 MG PO CAPS: 25.0000 mg | ORAL_CAPSULE | Freq: Every day | ORAL | 1 refills | Status: DC | PRN

## 2018-11-24 MED ORDER — DULOXETINE HCL 60 MG PO CPEP: 60.0000 mg | ORAL_CAPSULE | Freq: Every day | ORAL | 1 refills | Status: DC

## 2018-11-24 NOTE — Telephone Encounter (Signed)
ok 

## 2018-11-24 NOTE — Progress Notes (Signed)
error 

## 2018-11-24 NOTE — Progress Notes (Signed)
Virtual Visit via Video Note  I connected with Chase [name] on 11/24/18 at  4:00 PM EDT by a video enabled telemedicine application and verified that I am speaking with the correct person using two identifiers.   I discussed the limitations of evaluation and management by telemedicine and the availability of in person appointments. The patient expressed understanding and agreed to proceed.I discussed the assessment and treatment plan with the patient. The patient was provided an opportunity to ask questions and all were answered. The patient agreed with the plan and demonstrated an understanding of the instructions.   The patient was advised to call back or seek an in-person evaluation if the symptoms worsen or if the condition fails to improve as anticipated.   [place] MD OP Progress Note  11/24/2018 5:32 PM Chase [name]  MRN:  [id number]  Chief Complaint:  Chief Complaint    Follow-up     HPI: Chase [name] is a 68 year old Caucasian male who has a history of depression, panic attacks, cognitive disorder, alcohol use disorder, tobacco use disorder was evaluated by telemedicine today.  Video call was initiated however due to connection problem it had to be changed to a phone call.  Patient's wife Chase [name] provided collateral information.  Patient today reports he and his family are currently staying safe.  They try to stay at home mostly.  Patient reports his anxiety symptoms are currently under control.  Patient reports he does have some panic attacks on and off however he has been able to cope with it better than before.  Patient continues to be compliant with medications.  Patient denies any suicidality, homicidality or perceptual disturbances.  He reports he continues to smoke cigarettes and also drinks alcohol.  He reports he may go anywhere from 4 drinks a day and at times to 6 drinks of beer.  He however reports he is not interested in cutting back.  Provided counseling.   Visit  Diagnosis:    ICD-10-CM   1. Depressive disorder due to another medical condition with depressive features  F06.31    stable  2. Panic attacks  F41.0 DULoxetine (CYMBALTA) 60 MG capsule    hydrOXYzine (VISTARIL) 25 MG capsule  3. Tobacco use disorder  F17.200     Past Psychiatric History: I have reviewed past psychiatric history from my progress note on 08/10/2017.  Past trials of Effexor, Wellbutrin, Cymbalta, Xanax, Klonopin  Past Medical History:  Past Medical History:  Diagnosis Date  . Cerebrovascular accident (CVA) ([place]) 10/09/2011  . Clotting disorder ([place])   . DVT (deep venous thrombosis) ([place])   . Hearing deficit   . History of ischemic vertebrobasilar artery thalamic stroke 2001  . History of stroke   . Hyperlipidemia   . Hypertension   . Patent foramen ovale   . Right knee DJD   . Stroke [facility]) 2001    Past Surgical History:  Procedure Laterality Date  . APPENDECTOMY    . FRACTURE SURGERY     R leg  . INCISION AND DRAINAGE Right 06/30/2012   Procedure: INCISION AND DRAINAGE;  Surgeon: Lorn Junes, MD;  Location: [place];  Service: Orthopedics;  Laterality: Right;  . KNEE ARTHROSCOPY     right knee  . KNEE BURSECTOMY Right 06/30/2012   Procedure: KNEE BURSECTOMY;  Surgeon: Lorn Junes, MD;  Location: [place];  Service: Orthopedics;  Laterality: Right;  Pre-patellar with wound closure  . TOTAL KNEE ARTHROPLASTY  01/26/2012   Procedure: TOTAL KNEE ARTHROPLASTY;  Surgeon: Lorn Junes, MD;  Location: [place];  Service: Orthopedics;  Laterality: Right;    Family Psychiatric History: I have reviewed family psychiatric history from my progress note on 08/10/2017.  Family History:  Family History  Problem Relation Age of Onset  . Cancer Father   . Seizures Brother     Social History: I have reviewed social history from my progress note on 08/10/2017. Social History   Socioeconomic History  . Marital status: Married    Spouse name: Chase [name]  . Number of children: 0   . Years of education: 14  . Highest education level: Master's degree (e.g., MA, MS, MEng, MEd, MSW, MBA)  Occupational History  . Not on file  Social Needs  . Financial resource strain: Not hard at all  . Food insecurity    Worry: Never true    Inability: Never true  . Transportation needs    Medical: No    Non-medical: No  Tobacco Use  . Smoking status: Heavy Tobacco Smoker    Packs/day: 2.00    Years: 49.00    Pack years: 98.00    Types: Cigarettes    Last attempt to quit: 06/13/2016    Years since quitting: 2.4  . Smokeless tobacco: Never Used  . Tobacco comment: started smoking at age 11  Substance and Sexual Activity  . Alcohol use: Yes    Alcohol/week: 28.0 - 42.0 standard drinks    Types: 28 - 42 Cans of beer per week    Comment: 4-6 beers a day  . Drug use: No  . Sexual activity: Not on file  Lifestyle  . Physical activity    Days per week: 0 days    Minutes per session: 0 min  . Stress: To some extent  Relationships  . Social Herbalist on phone: Never    Gets together: Once a week    Attends religious service: Never    Active member of club or organization: No    Attends meetings of clubs or organizations: Never    Relationship status: Married  Other Topics Concern  . Not on file  Social History Narrative  . Not on file    Allergies: No Known Allergies  Metabolic Disorder Labs: No results found for: HGBA1C, MPG No results found for: PROLACTIN Lab Results  Component Value Date   CHOL 173 05/04/2018   TRIG 76 05/04/2018   HDL 61 05/04/2018   CHOLHDL 2.8 05/04/2018   LDLCALC 97 05/04/2018   LDLCALC 105 (H) 07/11/2016   Lab Results  Component Value Date   TSH 2.25 03/13/2017    Therapeutic Level Labs: No results found for: LITHIUM No results found for: VALPROATE No components found for:  CBMZ  Current Medications: Current Outpatient Medications  Medication Sig Dispense Refill  . acetaminophen (TYLENOL) 325 MG tablet Take 2  tablets (650 mg total) by mouth every 6 (six) hours as needed for pain. 60 tablet 0  . amLODipine-benazepril (LOTREL) 5-20 MG capsule TAKE 1 CAPSULE EVERY DAY 90 capsule 4  . busPIRone (BUSPAR) 30 MG tablet Take 1 tablet (30 mg total) by mouth 2 (two) times daily. 180 tablet 1  . DULoxetine (CYMBALTA) 60 MG capsule Take 1 capsule (60 mg total) by mouth daily. 90 capsule 1  . fluticasone (FLONASE) 50 MCG/ACT nasal spray Place 1 spray into both nostrils as needed.     . hydrOXYzine (VISTARIL) 25 MG capsule Take 1 capsule (25 mg total) by mouth daily  as needed for anxiety (for sever panic sx). 90 capsule 1  . montelukast (SINGULAIR) 10 MG tablet TAKE 1 TABLET DAILY FOR COUGH, NASAL DRAINAGE AND ALLERGIES 90 tablet 4  . warfarin (COUMADIN) 5 MG tablet TAKE 1 OR 2 TABLETS (5 OR 10MG ) DAILY AS DIRECTED BY PHYSICIAN 180 tablet 3   No current facility-administered medications for this visit.      Musculoskeletal: Strength & Muscle Tone: UTA Gait & Station: Reports as wnl Patient leans: N/A  Psychiatric Specialty Exam: Review of Systems  Psychiatric/Behavioral: The patient is nervous/anxious.   All other systems reviewed and are negative.   There were no vitals taken for this visit.There is no height or weight on file to calculate BMI.  General Appearance: UTA  Eye Contact:  UTA  Speech:  Clear and Coherent  Volume:  Normal  Mood:  Anxious able to cope  Affect:  UTA  Thought Process:  Goal Directed and Descriptions of Associations: Intact  Orientation:  Other:  self,situation  Thought Content: Logical   Suicidal Thoughts:  No  Homicidal Thoughts:  No  Memory:  Immediate;   Fair Recent;   limited Remote;   limited  Judgement:  Fair  Insight:  Fair  Psychomotor Activity:  Normal  Concentration:  Concentration: Fair and Attention Span: Fair  Recall:  [employer] of Knowledge: Fair  Language: Fair  Akathisia:  No  Handed:  Right  AIMS (if indicated): denies tremors, rigidity   Assets:  Communication Skills Desire for Improvement Social Support  ADL's:  Intact  Cognition: baseline  Sleep:  Fair   Screenings: PHQ2-9     Office Visit from 05/04/2018 in [place] from 04/19/2018 in [place] from 04/10/2017 in [place] Visit from 12/18/2016 in [place] Visit from 07/11/2016 in [place]  PHQ-2 Total Score  2  4  6  6  1   PHQ-9 Total Score  3  5  15  24  4        Assessment and Plan: Chase [name] is a 68 year old Caucasian male who has a history of depression, panic attacks, cognitive impairment, alcohol use disorder, tobacco use disorder was evaluated by telemedicine today.  Patient with history of left-sided thalamic stroke, cognitive impairment, is currently doing well on the current medication regimen.  Patient continues to have comorbid substance abuse problem-alcoholism.  We will continue to monitor closely.  Plan For depressive disorder due to another medical condition left-sided stroke-stable Cymbalta 60 mg p.o. daily. BuSpar as prescribed Continue CBT as needed.  Panic attacks-stable BuSpar 30 mg p.o. twice daily Hydroxyzine 25 mg p.o. daily PRN for severe anxiety attacks  For alcohol use disorder-unstable Patient is not willing to quit.  He continues to use 4-6 beer per day.  We will continue to monitor closely.  Tobacco use disorder-patient is not ready to quit provided smoking cessation counseling again.  Collateral information was obtained from wife Chase [name].  Follow-up in clinic in 3 months or sooner if needed.  October 28 at 4 PM  I have spent atleast 15 minutes non face to face with patient today. More than 50 % of the time was spent for psychoeducation and supportive psychotherapy and care coordination.  This note was generated in part or whole with voice recognition software. Voice recognition is usually quite  accurate but there are transcription errors that can and very often do occur. I apologize for any typographical errors that were  not detected and corrected.       Ursula Alert, MD 11/24/2018, 5:32 PM

## 2018-11-24 NOTE — Telephone Encounter (Signed)
pt wife called  and he she about 80 pills left from a previous script of the hydroxyzine. she going to have pharmacy put your rx on hold and she going to use the ones she already have she wanted to let you know.

## 2018-12-21 ENCOUNTER — Ambulatory Visit: Payer: Self-pay

## 2018-12-22 ENCOUNTER — Ambulatory Visit (INDEPENDENT_AMBULATORY_CARE_PROVIDER_SITE_OTHER): Payer: Medicare Other

## 2018-12-22 ENCOUNTER — Other Ambulatory Visit: Payer: Self-pay

## 2018-12-22 DIAGNOSIS — Z86718 Personal history of other venous thrombosis and embolism: Secondary | ICD-10-CM | POA: Diagnosis not present

## 2018-12-22 LAB — POCT INR
INR: 2 (ref 2.0–3.0)
PT: 24.5

## 2018-12-22 NOTE — Patient Instructions (Signed)
Description   Current Coumadin Dose: 5mg daily, except 10mg on Monday, Wednesday, Friday Today's Changes: none Recheck: 4 weeks    

## 2019-01-18 DIAGNOSIS — H43313 Vitreous membranes and strands, bilateral: Secondary | ICD-10-CM | POA: Diagnosis not present

## 2019-01-19 ENCOUNTER — Other Ambulatory Visit: Payer: Self-pay

## 2019-01-19 ENCOUNTER — Ambulatory Visit (INDEPENDENT_AMBULATORY_CARE_PROVIDER_SITE_OTHER): Payer: Medicare Other

## 2019-01-19 DIAGNOSIS — Z86718 Personal history of other venous thrombosis and embolism: Secondary | ICD-10-CM

## 2019-01-19 LAB — POCT INR
INR: 1.9 — AB (ref 2.0–3.0)
PT: 22.3

## 2019-01-19 NOTE — Patient Instructions (Signed)
Description    10 mg daily, except 5 mg on Monday, Wednesday, Friday  Recheck: 2 weeks

## 2019-01-31 DIAGNOSIS — Z23 Encounter for immunization: Secondary | ICD-10-CM | POA: Diagnosis not present

## 2019-02-02 ENCOUNTER — Ambulatory Visit (INDEPENDENT_AMBULATORY_CARE_PROVIDER_SITE_OTHER): Payer: Medicare Other

## 2019-02-02 ENCOUNTER — Other Ambulatory Visit: Payer: Self-pay

## 2019-02-02 DIAGNOSIS — Z86718 Personal history of other venous thrombosis and embolism: Secondary | ICD-10-CM | POA: Diagnosis not present

## 2019-02-02 LAB — POCT INR
INR: 2.8 (ref 2.0–3.0)
PT: 33.3

## 2019-02-02 NOTE — Patient Instructions (Signed)
Description    Continue 10 mg daily, except 5 mg on Monday, Wednesday, Friday  Recheck: 2 weeks

## 2019-02-15 ENCOUNTER — Ambulatory Visit (INDEPENDENT_AMBULATORY_CARE_PROVIDER_SITE_OTHER): Payer: Medicare Other

## 2019-02-15 ENCOUNTER — Other Ambulatory Visit: Payer: Self-pay

## 2019-02-15 DIAGNOSIS — Z86718 Personal history of other venous thrombosis and embolism: Secondary | ICD-10-CM | POA: Diagnosis not present

## 2019-02-15 LAB — POCT INR
INR: 1.9 — AB (ref 2.0–3.0)
PT: 22.3

## 2019-02-15 NOTE — Patient Instructions (Signed)
Description    Continue 10 mg daily, except 5 mg on Monday, Wednesday, Friday  Recheck: 3 weeks

## 2019-02-23 ENCOUNTER — Ambulatory Visit (INDEPENDENT_AMBULATORY_CARE_PROVIDER_SITE_OTHER): Payer: Medicare Other | Admitting: Psychiatry

## 2019-02-23 ENCOUNTER — Other Ambulatory Visit: Payer: Self-pay

## 2019-02-23 ENCOUNTER — Encounter: Payer: Self-pay | Admitting: Psychiatry

## 2019-02-23 DIAGNOSIS — F17200 Nicotine dependence, unspecified, uncomplicated: Secondary | ICD-10-CM

## 2019-02-23 DIAGNOSIS — F09 Unspecified mental disorder due to known physiological condition: Secondary | ICD-10-CM | POA: Insufficient documentation

## 2019-02-23 DIAGNOSIS — F0631 Mood disorder due to known physiological condition with depressive features: Secondary | ICD-10-CM

## 2019-02-23 DIAGNOSIS — F1020 Alcohol dependence, uncomplicated: Secondary | ICD-10-CM | POA: Insufficient documentation

## 2019-02-23 DIAGNOSIS — F1010 Alcohol abuse, uncomplicated: Secondary | ICD-10-CM

## 2019-02-23 DIAGNOSIS — F410 Panic disorder [episodic paroxysmal anxiety] without agoraphobia: Secondary | ICD-10-CM

## 2019-02-23 NOTE — Progress Notes (Signed)
Virtual Visit via Video Note  I connected with Chase [name] on 02/23/19 at  4:00 PM EDT by a video enabled telemedicine application and verified that I am speaking with the correct person using two identifiers.   I discussed the limitations of evaluation and management by telemedicine and the availability of in person appointments. The patient expressed understanding and agreed to proceed.   I discussed the assessment and treatment plan with the patient. The patient was provided an opportunity to ask questions and all were answered. The patient agreed with the plan and demonstrated an understanding of the instructions.   The patient was advised to call back or seek an in-person evaluation if the symptoms worsen or if the condition fails to improve as anticipated.   [place] MD OP Progress Note  02/23/2019 4:18 PM Chase [name]  MRN:  [id number]  Chief Complaint:  Chief Complaint    Follow-up     HPI: Chase [name] is a 68 year old Caucasian male who has a history of depression, panic attacks, cognitive disorder, alcohol use disorder, tobacco use disorder was evaluated by telemedicine today.  Patient's wife Chase [name] provided collateral information.  Per wife patient is currently doing well.  She reports he is staying busy working on projects around the house.  He is compliant on medications.  Patient reports he continues to have some anxiety symptoms on and off.  However he is able to better cope with it.  There has been days when he wakes up in a sweat and feels nervous however he is able to do his breathing techniques and that helps.  Patient reports sleep is good.  He denies any suicidality, homicidality or perceptual disturbances.  He appeared to be alert, oriented and was able to answer questions appropriately although with support from his wife.  Patient continues to smoke cigarettes and is not ready to quit.  Provided smoking cessation counseling.  He also drinks alcohol-four 12 ounce  beers per day.  He reports he is not willing to cut back on it.  Patient denies any other concerns today.   Visit Diagnosis:    ICD-10-CM   1. 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 Panic attacks  F41.0    stable  3. Tobacco use disorder  F17.200   4. Alcohol use disorder, mild, abuse  F10.10   5. Mild cognitive disorder  F09     Past Psychiatric History: I have reviewed past psychiatric history from my progress note on 08/10/2017.  Past trials of Effexor, Wellbutrin, Cymbalta, Xanax, Klonopin  Past Medical History:  Past Medical History:  Diagnosis Date  . Cerebrovascular accident (CVA) ([place]) 10/09/2011  . Clotting disorder ([place])   . DVT (deep venous thrombosis) ([place])   . Hearing deficit   . History of ischemic vertebrobasilar artery thalamic stroke 2001  . History of stroke   . Hyperlipidemia   . Hypertension   . Patent foramen ovale   . Right knee DJD   . Stroke [facility]) 2001    Past Surgical History:  Procedure Laterality Date  . APPENDECTOMY    . FRACTURE SURGERY     R leg  . INCISION AND DRAINAGE Right 06/30/2012   Procedure: INCISION AND DRAINAGE;  Surgeon: Lorn Junes, MD;  Location: [place];  Service: Orthopedics;  Laterality: Right;  . KNEE ARTHROSCOPY     right knee  . KNEE BURSECTOMY Right 06/30/2012   Procedure: KNEE  BURSECTOMY;  Surgeon: Lorn Junes, MD;  Location: [place];  Service: Orthopedics;  Laterality: Right;  Pre-patellar with wound closure  . TOTAL KNEE ARTHROPLASTY  01/26/2012   Procedure: TOTAL KNEE ARTHROPLASTY;  Surgeon: Lorn Junes, MD;  Location: [place];  Service: Orthopedics;  Laterality: Right;    Family Psychiatric History: I have reviewed family psychiatric history from my progress note on 08/10/2017.  Family History:  Family History  Problem Relation Age of Onset  . Cancer Father   . Seizures Brother     Social History: I have reviewed social history from my  progress note on 08/10/2017. Social History   Socioeconomic History  . Marital status: Married    Spouse name: debra  . Number of children: 0  . Years of education: 58  . Highest education level: Master's degree (e.g., MA, MS, MEng, MEd, MSW, MBA)  Occupational History  . Not on file  Social Needs  . Financial resource strain: Not hard at all  . Food insecurity    Worry: Never true    Inability: Never true  . Transportation needs    Medical: No    Non-medical: No  Tobacco Use  . Smoking status: Heavy Tobacco Smoker    Packs/day: 2.00    Years: 49.00    Pack years: 98.00    Types: Cigarettes    Last attempt to quit: 06/13/2016    Years since quitting: 2.6  . Smokeless tobacco: Never Used  . Tobacco comment: started smoking at age 54  Substance and Sexual Activity  . Alcohol use: Yes    Alcohol/week: 28.0 - 42.0 standard drinks    Types: 28 - 42 Cans of beer per week    Comment: 4-6 beers a day  . Drug use: No  . Sexual activity: Not on file  Lifestyle  . Physical activity    Days per week: 0 days    Minutes per session: 0 min  . Stress: To some extent  Relationships  . Social Herbalist on phone: Never    Gets together: Once a week    Attends religious service: Never    Active member of club or organization: No    Attends meetings of clubs or organizations: Never    Relationship status: Married  Other Topics Concern  . Not on file  Social History Narrative  . Not on file    Allergies:  Allergies  Allergen Reactions  . Chantix [Varenicline Tartrate]     confusion    Metabolic Disorder Labs: No results found for: HGBA1C, MPG No results found for: PROLACTIN Lab Results  Component Value Date   CHOL 173 05/04/2018   TRIG 76 05/04/2018   HDL 61 05/04/2018   CHOLHDL 2.8 05/04/2018   LDLCALC 97 05/04/2018   LDLCALC 105 (H) 07/11/2016   Lab Results  Component Value Date   TSH 2.25 03/13/2017    Therapeutic Level Labs: No results found for:  LITHIUM No results found for: VALPROATE No components found for:  CBMZ  Current Medications: Current Outpatient Medications  Medication Sig Dispense Refill  . acetaminophen (TYLENOL) 325 MG tablet Take 2 tablets (650 mg total) by mouth every 6 (six) hours as needed for pain. 60 tablet 0  . amLODipine-benazepril (LOTREL) 5-20 MG capsule TAKE 1 CAPSULE EVERY DAY 90 capsule 4  . amoxicillin (AMOXIL) 500 MG tablet TAKE 4 TABLETS BY MOUTH 1 HOUR PRIOR TO DENTAL APPOINTMENT    . busPIRone (BUSPAR) 30 MG  tablet Take 1 tablet (30 mg total) by mouth 2 (two) times daily. 180 tablet 1  . DULoxetine (CYMBALTA) 60 MG capsule Take 1 capsule (60 mg total) by mouth daily. 90 capsule 1  . fluticasone (FLONASE) 50 MCG/ACT nasal spray Place 1 spray into both nostrils as needed.     . hydrOXYzine (VISTARIL) 25 MG capsule Take 1 capsule (25 mg total) by mouth daily as needed for anxiety (for sever panic sx). 90 capsule 1  . montelukast (SINGULAIR) 10 MG tablet TAKE 1 TABLET DAILY FOR COUGH, NASAL DRAINAGE AND ALLERGIES 90 tablet 4  . warfarin (COUMADIN) 5 MG tablet TAKE 1 OR 2 TABLETS (5 OR 10MG ) DAILY AS DIRECTED BY PHYSICIAN 180 tablet 3   No current facility-administered medications for this visit.      Musculoskeletal: Strength & Muscle Tone: UTA Gait & Station: normal Patient leans: N/A  Psychiatric Specialty Exam: Review of Systems  Psychiatric/Behavioral: Positive for memory loss.  All other systems reviewed and are negative.   There were no vitals taken for this visit.There is no height or weight on file to calculate BMI.  General Appearance: Casual  Eye Contact:  Fair  Speech:  Clear and Coherent  Volume:  Normal  Mood:  Euthymic  Affect:  Congruent  Thought Process:  Goal Directed and Descriptions of Associations: Intact  Orientation:  Full (Time, Place, and Person)  Thought Content: Logical   Suicidal Thoughts:  No  Homicidal Thoughts:  No  Memory:  Immediate;   Fair Recent;    Limited Remote;   Limited  Judgement:  Fair  Insight:  Fair  Psychomotor Activity:  Normal  Concentration:  Concentration: Fair and Attention Span: Fair  Recall:  [employer] of Knowledge: Fair  Language: Fair  Akathisia:  No  Handed:  Right  AIMS (if indicated): denies tremors, rigidity  Assets:  Communication Skills Desire for [place] Talents/Skills  ADL's:  Intact  Cognition: baseline  Sleep:  Fair   Screenings: GAD-7     Office Visit from 02/23/2019 in [place]  Total GAD-7 Score  4    PHQ2-9     Office Visit from 02/23/2019 in [place] Office Visit from 05/04/2018 in [place] from 04/19/2018 in [place] from 04/10/2017 in [place] Visit from 12/18/2016 in [place]  PHQ-2 Total Score  0  2  4  6  6   PHQ-9 Total Score  -  3  5  15  24        Assessment and Plan: Khyan is a 68 year old Caucasian male who has a history of depression, panic attacks, cognitive impairment, alcohol use disorder, tobacco use disorder was evaluated by telemedicine today.  Patient with history of left-sided thalamic stroke, cognitive impairment is currently doing well on current medications.  Patient does have comorbid substance abuse problem-alcoholism.  Patient is currently doing well.  Discussed plan as noted below.  Plan Depressive disorder due to medical condition left-sided stroke-in remission Cymbalta 60 mg p.o. daily BuSpar as prescribed Continue CBT as needed PHQ 9 - 0  Panic attacks-stable BuSpar 30 mg p.o. twice daily Hydroxyzine 25 mg p.o. daily as needed for severe anxiety attacks GAD 7 - 4   Alcohol use disorder-improving. Provided counseling.  He uses four 12 ounce beers per day. Patient is not willing to quit  Tobacco use disorder-patient is not ready to  quit. Provided smoking  cessation counseling.  Mild cognitive disorder- chronic  -patient has good support system from his wife.  Per wife there has not been any changes in his cognitive function which has been stable.  Will monitor closely.  Collateral information was obtained from wife Chase [name] as summarized above.  Follow-up in clinic in 3 months or sooner if needed.  January 27 at 4 PM  I have spent atleast 15 minutes non face to face with patient today. More than 50 % of the time was spent for psychoeducation and supportive psychotherapy and care coordination. This note was generated in part or whole with voice recognition software. Voice recognition is usually quite accurate but there are transcription errors that can and very often do occur. I apologize for any typographical errors that were not detected and corrected.       Ursula Alert, MD 02/23/2019, 4:18 PM

## 2019-03-08 ENCOUNTER — Ambulatory Visit (INDEPENDENT_AMBULATORY_CARE_PROVIDER_SITE_OTHER): Payer: Medicare Other

## 2019-03-08 ENCOUNTER — Other Ambulatory Visit: Payer: Self-pay

## 2019-03-08 DIAGNOSIS — Z86718 Personal history of other venous thrombosis and embolism: Secondary | ICD-10-CM | POA: Diagnosis not present

## 2019-03-08 LAB — POCT INR
INR: 2.5 (ref 2.0–3.0)
Prothrombin Time: 29.5

## 2019-03-08 NOTE — Patient Instructions (Signed)
Description    Continue 10 mg daily, except 5 mg on Monday, Wednesday, Friday  Recheck: 4 weeks

## 2019-04-05 ENCOUNTER — Ambulatory Visit (INDEPENDENT_AMBULATORY_CARE_PROVIDER_SITE_OTHER): Payer: Medicare Other

## 2019-04-05 ENCOUNTER — Other Ambulatory Visit: Payer: Self-pay

## 2019-04-05 DIAGNOSIS — Z86718 Personal history of other venous thrombosis and embolism: Secondary | ICD-10-CM

## 2019-04-05 LAB — POCT INR
INR: 2.3 (ref 2.0–3.0)
PT: 27.5

## 2019-04-05 NOTE — Patient Instructions (Signed)
Description    Continue 10 mg daily, except 5 mg on Monday, Wednesday, Friday  Recheck: 4 weeks

## 2019-04-19 NOTE — Progress Notes (Addendum)
Subjective:   Chase [name] is a 68 y.o. male who presents for Medicare Annual/Subsequent preventive examination.    This visit is being conducted through telemedicine due to the COVID-19 pandemic. This patient has given me verbal consent via doximity to conduct this visit, patient states they are participating from their home address. Some vital signs may be absent or patient reported.    Patient identification: identified by name, DOB, and current address  Review of Systems:  N/A  Cardiac Risk Factors include: advanced age (>68mn, >>13women);sedentary lifestyle;smoking/ tobacco exposure;male gender;hypertension;dyslipidemia     Objective:    Vitals: There were no vitals taken for this visit.  There is no height or weight on file to calculate BMI. Unable to obtain vitals due to visit being conducted via telephonically.   Advanced Directives 04/25/2019 04/19/2018 04/10/2017 06/30/2012 06/29/2012 01/19/2012  Does Patient Have a Medical Advance Directive? Yes Yes Yes Patient has advance directive, copy not in chart Patient has advance directive, copy not in chart Patient has advance directive, copy not in chart  Type of Advance Directive [place] - Living will Living will Living will;Healthcare Power of [place] Chart? Yes - validated most recent copy scanned in chart (See row information) Yes - validated most recent copy scanned in chart (See row information) - Copy requested from family Copy requested from family -  Pre-existing out of facility DNR order (yellow form or pink MOST form) - - - No No No  Some encounter information is confidential and restricted. Go to Review Flowsheets activity to see all data.    Tobacco Social History   Tobacco Use  Smoking Status Heavy Tobacco Smoker   Packs/day: 2.00   Years: 49.00   Pack years: 98.00   Types: Cigarettes   Last  attempt to quit: 06/13/2016   Years since quitting: 2.8  Smokeless Tobacco Never Used  Tobacco Comment   started smoking at age 68    Ready to quit: No Counseling given: No Comment: started smoking at age 68  Clinical Intake:  Pre-visit preparation completed: Yes  Pain : No/denies pain Pain Score: 0-No pain     Nutritional Risks: None Diabetes: No  How often do you need to have someone help you when you read instructions, pamphlets, or other written materials from your doctor or pharmacy?: 1 - Never  Interpreter Needed?: No  Information [facility] by :: [facility] L[facility]  Past Medical History:  Diagnosis Date   [place]   [place] disorder [place]    DVT (deep venous thrombosis) ([place]    Hearing deficit    History of ischemic vertebrobasilar artery thalamic stroke 2001   History of stroke    Hyperlipidemia    Hypertension    Patent foramen ovale    Right knee DJD    Stroke [place]   Past Surgical History:  Procedure Laterality Date   APPENDECTOMY     FRACTURE SURGERY     R leg   INCISION AND DRAINAGE Right 06/30/2012   Procedure: INCISION AND DRAINAGE;  Surgeon: RLorn Junes MD;  [place]  Service: Orthopedics;  Laterality: Right;   KNEE ARTHROSCOPY     right knee   KNEE BURSECTOMY Right 06/30/2012   Procedure: KNEE BURSECTOMY;  Surgeon: RLorn Junes MD;  [place]  Service: Orthopedics;  Laterality: Right;  Pre-patellar with wound closure  TOTAL KNEE ARTHROPLASTY  01/26/2012   Procedure: TOTAL KNEE ARTHROPLASTY;  Surgeon: Lorn Junes, MD;  Location: [place];  Service: Orthopedics;  Laterality: Right;   Family History  Problem Relation Age of Onset   Cancer Father    Seizures Brother    Social History   Socioeconomic History   Marital status: Married    Spouse name: debra   Number of children: 0   Years of education: 12   Highest education level: Master's degree (e.g., MA, MS, MEng, MEd, MSW,  MBA)  Occupational History   Occupation: retired   Occupation: disabled @ age 89  Tobacco Use   Smoking status: Heavy Tobacco Smoker    Packs/day: 2.00    Years: 49.00    Pack years: 98.00    Types: Cigarettes    Last attempt to quit: 06/13/2016    Years since quitting: 2.8   Smokeless tobacco: Never Used   Tobacco comment: started smoking at age 71  Substance and Sexual Activity   Alcohol use: Yes    Alcohol/week: 28.0 - 42.0 standard drinks    Types: 28 - 42 Cans of beer per week    Comment: 4-6 beers a day   Drug use: No   Sexual activity: Not on file  Other Topics Concern   Not on file  Social History Narrative   Not on file   Social Determinants of Health   Financial Resource Strain: Low Risk    Difficulty of Paying Living Expenses: Not hard at all  Food Insecurity: No Food Insecurity   Worried About Charity fundraiser in the Last Year: Never true   Ran Out of Food in the Last Year: Never true  Transportation Needs: No Transportation Needs   Lack of Transportation (Medical): No   Lack of Transportation (Non-Medical): No  Physical Activity: Inactive   Days of Exercise per Week: 0 days   Minutes of Exercise per Session: 0 min  Stress: No Stress Concern Present   Feeling of Stress : Not at all  Social Connections: Moderately Isolated   Frequency of Communication with Friends and Family: Never   Frequency of Social Gatherings with Friends and Family: Once a week   Attends Religious Services: Never   Marine scientist or Organizations: No   Attends Archivist Meetings: Never   Marital Status: Married    Outpatient Encounter Medications as of 04/25/2019  Medication Sig   acetaminophen (TYLENOL) 325 MG tablet Take 2 tablets (650 mg total) by mouth every 6 (six) hours as needed for pain.   amLODipine-benazepril (LOTREL) 5-20 MG capsule TAKE 1 CAPSULE EVERY DAY   amoxicillin (AMOXIL) 500 MG tablet TAKE 4 TABLETS BY MOUTH 1 HOUR PRIOR TO DENTAL  APPOINTMENT   busPIRone (BUSPAR) 30 MG tablet Take 1 tablet (30 mg total) by mouth 2 (two) times daily.   DULoxetine (CYMBALTA) 60 MG capsule Take 1 capsule (60 mg total) by mouth daily.   fluticasone (FLONASE) 50 MCG/ACT nasal spray Place 1 spray into both nostrils as needed.    hydrOXYzine (VISTARIL) 25 MG capsule Take 1 capsule (25 mg total) by mouth daily as needed for anxiety (for sever panic sx).   montelukast (SINGULAIR) 10 MG tablet TAKE 1 TABLET DAILY FOR COUGH, NASAL DRAINAGE AND ALLERGIES   warfarin (COUMADIN) 5 MG tablet TAKE 1 OR 2 TABLETS (5 OR 10MG) DAILY AS DIRECTED BY PHYSICIAN   No facility-administered encounter medications on file as of 04/25/2019.  Activities of Daily Living In your present state of health, do you have any difficulty performing the following activities: 04/25/2019  Hearing? N  Vision? N  Difficulty concentrating or making decisions? Y  Comment Short term memory loss due to a stroke @ age 72.  Walking or climbing stairs? N  Dressing or bathing? N  Doing errands, shopping? N  Preparing Food and eating ? N  Using the Toilet? N  In the past six months, have you accidently leaked urine? N  Do you have problems with loss of bowel control? N  Managing your Medications? Y  Comment Wife manages medications.  Managing your Finances? Y  Comment Wife manages finances.  Housekeeping or managing your Housekeeping? N  Some recent data might be hidden    Patient Care Team: Birdie Sons, MD as PCP - General (Family Medicine) Ursula Alert, MD as Consulting Physician (Psychiatry) [employer] [facility])   Assessment:   This is a routine wellness examination for Irma.  Exercise Activities and Dietary recommendations Current Exercise Habits: The patient does not participate in regular exercise at present, Exercise limited by: neurologic condition(s)(previous stroke)  Goals      DIET - INCREASE WATER INTAKE     Recommend increasing water  intake to 6-8 glasses a day. Recommend to cut back on alcohol intake and pt denied changing.     Quit Smoking     Recommend to continue efforts to reduce smoking habits until no longer smoking.       Reduce alcohol intake     Recommend to cut back on alcohol in take by half.        Fall Risk: Fall Risk  04/25/2019 04/19/2018 04/10/2017 07/11/2016  Falls in the past year? 0 0 No No  Number falls in past yr: 0 0 - -  Injury with Fall? 0 0 - -    FALL RISK PREVENTION PERTAINING TO THE HOME:  Any stairs in or around the home? Yes  If so, are there any without handrails? No   Home free of loose throw rugs in walkways, pet beds, electrical cords, etc? Yes  Adequate lighting in your home to reduce risk of falls? Yes   ASSISTIVE DEVICES UTILIZED TO PREVENT FALLS:  Life alert? No  Use of a cane, walker or w/c? No  Grab bars in the bathroom? Yes  Shower chair or bench in shower? No  Elevated toilet seat or a handicapped toilet? Yes   TIMED UP AND GO:  Was the test performed? No .    Depression Screen PHQ 2/9 Scores 04/25/2019 04/25/2019 05/04/2018 04/19/2018  PHQ - 2 Score 0 0 2 4  PHQ- 9 Score - - 3 5  Some encounter information is confidential and restricted. Go to Review Flowsheets activity to see all data.    Cognitive Function: Declined today.     6CIT Screen 04/10/2017  What Year? 4 points  What month? 0 points  What time? 0 points  Count back from 20 0 points  Months in reverse 0 points  Repeat phrase 10 points  Total Score 14    Immunization History  Administered Date(s) Administered   Influenza Split 01/27/2012   Influenza, High Dose Seasonal PF 12/18/2016, 01/15/2018   Influenza, Quadrivalent, Recombinant, Inj, Pf 01/31/2019   Influenza,inj,Quad PF,6+ Mos 04/06/2015   Pneumococcal Conjugate-13 12/18/2016   Pneumococcal Polysaccharide-23 01/27/2012, 05/04/2018   Td 11/23/2015   Tdap 11/10/2005    Qualifies for Shingles Vaccine? Yes . Due for  Shingrix. Pt has been advised to call insurance company to determine out of pocket expense. Advised may also receive vaccine at local pharmacy or Health Dept. Verbalized acceptance and understanding.  Tdap: Up to date  Flu Vaccine: Up to date  Pneumococcal Vaccine: Completed series  Screening Tests Health Maintenance  Topic Date Due   COLONOSCOPY  10/04/2000   TETANUS/TDAP  11/22/2025   INFLUENZA VACCINE  Completed   Hepatitis C Screening  Completed   PNA vac Low Risk Adult  Completed   Cancer Screenings:  Colorectal Screening: Currently due. Declined a colonoscopy referral or a cologuard kit order today.   Lung Cancer Screening: (Low Dose CT Chest recommended if Age 63-80 years, 30 pack-year currently smoking OR have quit w/in 15years.) does qualify, however declines order today.   Additional Screening:  Hepatitis C Screening: Up to date  Vision Screening: Recommended annual ophthalmology exams for early detection of glaucoma and other disorders of the eye.  Dental Screening: Recommended annual dental exams for proper oral hygiene  Community Resource Referral:  CRR required this visit?  No        Plan:  I have personally reviewed and addressed the Medicare Annual Wellness questionnaire and have noted the following in the patient's chart:  A. Medical and social history B. Use of alcohol, tobacco or illicit drugs  C. Current medications and supplements D. Functional ability and status E.  Nutritional status F.  Physical activity G. Advance directives H. List of other physicians I.  Hospitalizations, surgeries, and ER visits in previous 12 months J.  Ione such as hearing and vision if needed, cognitive and depression L. Referrals and appointments   In addition, I have reviewed and discussed with patient certain preventive protocols, quality metrics, and best practice recommendations. A written personalized care plan for preventive services as well as  general preventive health recommendations were provided to patient.   [place] Score, [place]  [id number] Nurse Health Advisor   Nurse Notes: Declined a colonoscopy referral or a cologuard kit order today.

## 2019-04-25 ENCOUNTER — Other Ambulatory Visit: Payer: Self-pay

## 2019-04-25 ENCOUNTER — Ambulatory Visit (INDEPENDENT_AMBULATORY_CARE_PROVIDER_SITE_OTHER): Payer: Medicare Other

## 2019-04-25 DIAGNOSIS — Z0000 Encounter for general adult medical examination without abnormal findings: Secondary | ICD-10-CM | POA: Diagnosis not present

## 2019-04-25 NOTE — Patient Instructions (Signed)
Chase [name] , Thank you for taking time to come for your Medicare Wellness Visit. I appreciate your ongoing commitment to your health goals. Please review the following plan we discussed and let me know if I can assist you in the future.   Screening recommendations/referrals: Colonoscopy: Currently due. Declined a colonoscopy referral and cologuard kit today.  Recommended yearly ophthalmology/optometry visit for glaucoma screening and checkup Recommended yearly dental visit for hygiene and checkup  Vaccinations: Influenza vaccine: Up to date Pneumococcal vaccine: Completed series Tdap vaccine: Up to date, due 10/2025 Shingles vaccine: Pt declines today.     Advanced directives: Currently on file.   Conditions/risks identified: Recommend to cut back on alcohol intake and increase water intake to 6-8 8 oz glasses a day. Smoking cessation discussed today.   Next appointment: 05/02/18 @ 9:00 AM for a PT/INR check. Declined scheduling an AWV for 2021 at this time.   Preventive Care 68 Years and Older, Male Preventive care refers to lifestyle choices and visits with your health care provider that can promote health and wellness. What does preventive care include?  A yearly physical exam. This is also called an annual well check.  Dental exams once or twice a year.  Routine eye exams. Ask your health care provider how often you should have your eyes checked.  Personal lifestyle choices, including:  Daily care of your teeth and gums.  Regular physical activity.  Eating a healthy diet.  Avoiding tobacco and drug use.  Limiting alcohol use.  Practicing safe sex.  Taking low doses of aspirin every day.  Taking vitamin and mineral supplements as recommended by your health care provider. What happens during an annual well check? The services and screenings done by your health care provider during your annual well check will depend on your age, overall health, lifestyle risk factors,  and family history of disease. Counseling  Your health care provider may ask you questions about your:  Alcohol use.  Tobacco use.  Drug use.  Emotional well-being.  Home and relationship well-being.  Sexual activity.  Eating habits.  History of falls.  Memory and ability to understand (cognition).  Work and work Statistician. Screening  You may have the following tests or measurements:  Height, weight, and BMI.  Blood pressure.  Lipid and cholesterol levels. These may be checked every 5 years, or more frequently if you are over 66 years old.  Skin check.  Lung cancer screening. You may have this screening every year starting at age 85 if you have a 30-pack-year history of smoking and currently smoke or have quit within the past 15 years.  Fecal occult blood test (FOBT) of the stool. You may have this test every year starting at age 65.  Flexible sigmoidoscopy or colonoscopy. You may have a sigmoidoscopy every 5 years or a colonoscopy every 10 years starting at age 63.  Prostate cancer screening. Recommendations will vary depending on your family history and other risks.  Hepatitis C blood test.  Hepatitis B blood test.  Sexually transmitted disease (STD) testing.  Diabetes screening. This is done by checking your blood sugar (glucose) after you have not eaten for a while (fasting). You may have this done every 1-3 years.  Abdominal aortic aneurysm (AAA) screening. You may need this if you are a current or former smoker.  Osteoporosis. You may be screened starting at age 1 if you are at high risk. Talk with your health care provider about your test results, treatment options, and if  necessary, the need for more tests. Vaccines  Your health care provider may recommend certain vaccines, such as:  Influenza vaccine. This is recommended every year.  Tetanus, diphtheria, and acellular pertussis (Tdap, Td) vaccine. You may need a Td booster every 10 years.   Zoster vaccine. You may need this after age 8.  Pneumococcal 13-valent conjugate (PCV13) vaccine. One dose is recommended after age 22.  Pneumococcal polysaccharide (PPSV23) vaccine. One dose is recommended after age 55. Talk to your health care provider about which screenings and vaccines you need and how often you need them. This information is not intended to replace advice given to you by your health care provider. Make sure you discuss any questions you have with your health care provider. Document Released: 05/11/2015 Document Revised: 01/02/2016 Document Reviewed: 02/13/2015 Elsevier Interactive Patient Education  2017 [place] Prevention in the Home Falls can cause injuries. They can happen to people of all ages. There are many things you can do to make your home safe and to help prevent falls. What can I do on the outside of my home?  Regularly fix the edges of walkways and driveways and fix any cracks.  Remove anything that might make you trip as you walk through a door, such as a raised step or threshold.  Trim any bushes or trees on the path to your home.  Use bright outdoor lighting.  Clear any walking paths of anything that might make someone trip, such as rocks or tools.  Regularly check to see if handrails are loose or broken. Make sure that both sides of any steps have handrails.  Any raised decks and porches should have guardrails on the edges.  Have any leaves, snow, or ice cleared regularly.  Use sand or salt on walking paths during winter.  Clean up any spills in your garage right away. This includes oil or grease spills. What can I do in the bathroom?  Use night lights.  Install grab bars by the toilet and in the tub and shower. Do not use towel bars as grab bars.  Use non-skid mats or decals in the tub or shower.  If you need to sit down in the shower, use a plastic, non-slip stool.  Keep the floor dry. Clean up any water that spills on  the floor as soon as it happens.  Remove soap buildup in the tub or shower regularly.  Attach bath mats securely with double-sided non-slip rug tape.  Do not have throw rugs and other things on the floor that can make you trip. What can I do in the bedroom?  Use night lights.  Make sure that you have a light by your bed that is easy to reach.  Do not use any sheets or blankets that are too big for your bed. They should not hang down onto the floor.  Have a firm chair that has side arms. You can use this for support while you get dressed.  Do not have throw rugs and other things on the floor that can make you trip. What can I do in the kitchen?  Clean up any spills right away.  Avoid walking on wet floors.  Keep items that you use a lot in easy-to-reach places.  If you need to reach something above you, use a strong step stool that has a grab bar.  Keep electrical cords out of the way.  Do not use floor polish or wax that makes floors slippery. If you must  use wax, use non-skid floor wax.  Do not have throw rugs and other things on the floor that can make you trip. What can I do with my stairs?  Do not leave any items on the stairs.  Make sure that there are handrails on both sides of the stairs and use them. Fix handrails that are broken or loose. Make sure that handrails are as long as the stairways.  Check any carpeting to make sure that it is firmly attached to the stairs. Fix any carpet that is loose or worn.  Avoid having throw rugs at the top or bottom of the stairs. If you do have throw rugs, attach them to the floor with carpet tape.  Make sure that you have a light switch at the top of the stairs and the bottom of the stairs. If you do not have them, ask someone to add them for you. What else can I do to help prevent falls?  Wear shoes that:  Do not have high heels.  Have rubber bottoms.  Are comfortable and fit you well.  Are closed at the toe. Do not  wear sandals.  If you use a stepladder:  Make sure that it is fully opened. Do not climb a closed stepladder.  Make sure that both sides of the stepladder are locked into place.  Ask someone to hold it for you, if possible.  Clearly mark and make sure that you can see:  Any grab bars or handrails.  First and last steps.  Where the edge of each step is.  Use tools that help you move around (mobility aids) if they are needed. These include:  Canes.  Walkers.  Scooters.  Crutches.  Turn on the lights when you go into a dark area. Replace any light bulbs as soon as they burn out.  Set up your furniture so you have a clear path. Avoid moving your furniture around.  If any of your floors are uneven, fix them.  If there are any pets around you, be aware of where they are.  Review your medicines with your doctor. Some medicines can make you feel dizzy. This can increase your chance of falling. Ask your doctor what other things that you can do to help prevent falls. This information is not intended to replace advice given to you by your health care provider. Make sure you discuss any questions you have with your health care provider. Document Released: 02/08/2009 Document Revised: 09/20/2015 Document Reviewed: 05/19/2014 Elsevier Interactive Patient Education  2017 [employer].

## 2019-05-03 ENCOUNTER — Ambulatory Visit: Payer: Medicare Other

## 2019-05-07 ENCOUNTER — Other Ambulatory Visit: Payer: Self-pay | Admitting: Psychiatry

## 2019-05-07 DIAGNOSIS — F410 Panic disorder [episodic paroxysmal anxiety] without agoraphobia: Secondary | ICD-10-CM

## 2019-05-10 ENCOUNTER — Ambulatory Visit (INDEPENDENT_AMBULATORY_CARE_PROVIDER_SITE_OTHER): Payer: Medicare Other

## 2019-05-10 ENCOUNTER — Other Ambulatory Visit: Payer: Self-pay

## 2019-05-10 DIAGNOSIS — Z86718 Personal history of other venous thrombosis and embolism: Secondary | ICD-10-CM

## 2019-05-10 LAB — POCT INR
INR: 2.6 (ref 2.0–3.0)
PT: 30.8

## 2019-05-10 NOTE — Progress Notes (Signed)
   Description    Continue 10 mg daily, except 5 mg on Monday, Wednesday, Friday  Recheck: 4 weeks

## 2019-05-25 ENCOUNTER — Ambulatory Visit (INDEPENDENT_AMBULATORY_CARE_PROVIDER_SITE_OTHER): Payer: Medicare Other | Admitting: Psychiatry

## 2019-05-25 ENCOUNTER — Encounter: Payer: Self-pay | Admitting: Psychiatry

## 2019-05-25 ENCOUNTER — Other Ambulatory Visit: Payer: Self-pay

## 2019-05-25 DIAGNOSIS — F0631 Mood disorder due to known physiological condition with depressive features: Secondary | ICD-10-CM | POA: Diagnosis not present

## 2019-05-25 DIAGNOSIS — F1010 Alcohol abuse, uncomplicated: Secondary | ICD-10-CM | POA: Diagnosis not present

## 2019-05-25 DIAGNOSIS — F17200 Nicotine dependence, unspecified, uncomplicated: Secondary | ICD-10-CM

## 2019-05-25 DIAGNOSIS — F09 Unspecified mental disorder due to known physiological condition: Secondary | ICD-10-CM

## 2019-05-25 DIAGNOSIS — F410 Panic disorder [episodic paroxysmal anxiety] without agoraphobia: Secondary | ICD-10-CM

## 2019-05-25 MED ORDER — DULOXETINE HCL 60 MG PO CPEP: 60.0000 mg | ORAL_CAPSULE | Freq: Every day | ORAL | 1 refills | Status: DC

## 2019-05-25 NOTE — Progress Notes (Signed)
Virtual Visit via Video Note  I connected with Chase [name] on 05/25/19 at  4:00 PM EST by a video enabled telemedicine application and verified that I am speaking with the correct person using two identifiers.   I discussed the limitations of evaluation and management by telemedicine and the availability of in person appointments. The patient expressed understanding and agreed to proceed.     I discussed the assessment and treatment plan with the patient. The patient was provided an opportunity to ask questions and all were answered. The patient agreed with the plan and demonstrated an understanding of the instructions.   The patient was advised to call back or seek an in-person evaluation if the symptoms worsen or if the condition fails to improve as anticipated.   [place] MD OP Progress Note  05/25/2019 4:20 PM Chase [name]  MRN:  [id number]  Chief Complaint:  Chief Complaint    Follow-up     HPI: Chase [name] is a 69 year old Caucasian male who has a history of depression, panic attacks, cognitive disorder, alcohol use disorder, tobacco use disorder was evaluated by telemedicine today.  Patient today reports he is currently making progress on the current medication regimen.  He does have anxiety attacks on and off however is able to manage it better.  He denies any significant panic attacks.  Patient reports sleep is good.  He does snore at night.  He however reports he feels rested when he wakes up.  Patient denies any suicidality, homicidality or perceptual disturbances.  He continues to smoke cigarettes and is not ready to quit.  Provided smoking cessation counseling.  He continues to use alcohol-4 drinks per day and reports he is not willing to quit.  Collateral information obtained from wife Chase [name]-who reports patient is doing well.   Visit Diagnosis:    ICD-10-CM   1. 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 Panic  attacks  F41.0 DULoxetine (CYMBALTA) 60 MG capsule  3. Tobacco use disorder  F17.200   4. Alcohol use disorder, mild, abuse  F10.10   5. Mild cognitive disorder  F09     Past Psychiatric History: I have reviewed past psychiatric history from my progress note on 08/10/2017.  Past trials of Effexor, Wellbutrin, Cymbalta, Xanax, Klonopin.  Past Medical History:  Past Medical History:  Diagnosis Date  . Cerebrovascular accident (CVA) ([place]) 10/09/2011  . Clotting disorder ([place])   . DVT (deep venous thrombosis) ([place])   . Hearing deficit   . History of ischemic vertebrobasilar artery thalamic stroke 2001  . History of stroke   . Hyperlipidemia   . Hypertension   . Patent foramen ovale   . Right knee DJD   . Stroke [facility]) 2001    Past Surgical History:  Procedure Laterality Date  . APPENDECTOMY    . FRACTURE SURGERY     R leg  . INCISION AND DRAINAGE Right 06/30/2012   Procedure: INCISION AND DRAINAGE;  Surgeon: Lorn Junes, MD;  Location: [facility];  Service: Orthopedics;  Laterality: Right;  . KNEE ARTHROSCOPY     right knee  . KNEE BURSECTOMY Right 06/30/2012   Procedure: KNEE BURSECTOMY;  Surgeon: Lorn Junes, MD;  Location: [place] [facility];  Service: Orthopedics;  Laterality: Right;  Pre-patellar with wound closure  . TOTAL KNEE ARTHROPLASTY  01/26/2012   Procedure: TOTAL KNEE ARTHROPLASTY;  Surgeon: Lorn Junes, MD;  Location: [place];  Service: Orthopedics;  Laterality: Right;  Family Psychiatric History: I have reviewed family psychiatric history from my progress note on 08/10/2017.  Family History:  Family History  Problem Relation Age of Onset  . Cancer Father   . Seizures Brother     Social History: Reviewed social history from my progress note on 08/10/2017. Social History   Socioeconomic History  . Marital status: Married    Spouse name: debra  . Number of children: 0  . Years of education: 26  . Highest education level: Master's degree (e.g., MA, MS, MEng, MEd, MSW,  MBA)  Occupational History  . Occupation: retired  . Occupation: disabled @ age 39  Tobacco Use  . Smoking status: Heavy Tobacco Smoker    Packs/day: 2.00    Years: 49.00    Pack years: 98.00    Types: Cigarettes    Last attempt to quit: 06/13/2016    Years since quitting: 2.9  . Smokeless tobacco: Never Used  . Tobacco comment: started smoking at age 29  Substance and Sexual Activity  . Alcohol use: Yes    Alcohol/week: 28.0 - 42.0 standard drinks    Types: 28 - 42 Cans of beer per week    Comment: 4-6 beers a day  . Drug use: No  . Sexual activity: Not on file  Other Topics Concern  . Not on file  Social History Narrative  . Not on file   Social Determinants of Health   Financial Resource Strain: Low Risk   . Difficulty of Paying Living Expenses: Not hard at all  Food Insecurity: No Food Insecurity  . Worried About Charity fundraiser in the Last Year: Never true  . Ran Out of Food in the Last Year: Never true  Transportation Needs: No Transportation Needs  . Lack of Transportation (Medical): No  . Lack of Transportation (Non-Medical): No  Physical Activity: Inactive  . Days of Exercise per Week: 0 days  . Minutes of Exercise per Session: 0 min  Stress: No Stress Concern Present  . Feeling of Stress : Not at all  Social Connections: Moderately Isolated  . Frequency of Communication with Friends and Family: Never  . Frequency of Social Gatherings with Friends and Family: Once a week  . Attends Religious Services: Never  . Active Member of Clubs or Organizations: No  . Attends Archivist Meetings: Never  . Marital Status: Married    Allergies:  Allergies  Allergen Reactions  . Chantix [Varenicline Tartrate]     confusion    Metabolic Disorder Labs: No results found for: HGBA1C, MPG No results found for: PROLACTIN Lab Results  Component Value Date   CHOL 173 05/04/2018   TRIG 76 05/04/2018   HDL 61 05/04/2018   CHOLHDL 2.8 05/04/2018   LDLCALC  97 05/04/2018   LDLCALC 105 (H) 07/11/2016   Lab Results  Component Value Date   TSH 2.25 03/13/2017    Therapeutic Level Labs: No results found for: LITHIUM No results found for: VALPROATE No components found for:  CBMZ  Current Medications: Current Outpatient Medications  Medication Sig Dispense Refill  . acetaminophen (TYLENOL) 325 MG tablet Take 2 tablets (650 mg total) by mouth every 6 (six) hours as needed for pain. 60 tablet 0  . amLODipine-benazepril (LOTREL) 5-20 MG capsule TAKE 1 CAPSULE EVERY DAY 90 capsule 4  . amoxicillin (AMOXIL) 500 MG tablet TAKE 4 TABLETS BY MOUTH 1 HOUR PRIOR TO DENTAL APPOINTMENT    . busPIRone (BUSPAR) 30 MG tablet TAKE 1 TABLET (  30 MG TOTAL) BY MOUTH 2 (TWO) TIMES DAILY. 180 tablet 1  . DULoxetine (CYMBALTA) 60 MG capsule Take 1 capsule (60 mg total) by mouth daily. 90 capsule 1  . fluticasone (FLONASE) 50 MCG/ACT nasal spray Place 1 spray into both nostrils as needed.     . hydrOXYzine (VISTARIL) 25 MG capsule Take 1 capsule (25 mg total) by mouth daily as needed for anxiety (for sever panic sx). 90 capsule 1  . montelukast (SINGULAIR) 10 MG tablet TAKE 1 TABLET DAILY FOR COUGH, NASAL DRAINAGE AND ALLERGIES 90 tablet 4  . warfarin (COUMADIN) 5 MG tablet TAKE 1 OR 2 TABLETS (5 OR 10MG ) DAILY AS DIRECTED BY PHYSICIAN 180 tablet 3   No current facility-administered medications for this visit.     Musculoskeletal: Strength & Muscle Tone: UTA Gait & Station: normal Patient leans: N/A  Psychiatric Specialty Exam: Review of Systems  Psychiatric/Behavioral: Negative for agitation, behavioral problems, confusion, decreased concentration, dysphoric mood, hallucinations, self-injury, sleep disturbance and suicidal ideas. The patient is not nervous/anxious and is not hyperactive.   All other systems reviewed and are negative.   There were no vitals taken for this visit.There is no height or weight on file to calculate BMI.  General Appearance:  Casual  Eye Contact:  Fair  Speech:  Clear and Coherent  Volume:  Normal  Mood:  Euthymic  Affect:  Congruent  Thought Process:  Goal Directed and Descriptions of Associations: Intact  Orientation:  Other:  Person place time and situation  Thought Content: Logical   Suicidal Thoughts:  No  Homicidal Thoughts:  No  Memory:  Immediate;   Fair Recent;   Limited Remote;   Limited  Judgement:  Fair  Insight:  Fair  Psychomotor Activity:  Normal  Concentration:  Concentration: Fair and Attention Span: Fair  Recall:  [employer] of Knowledge: Fair  Language: Fair  Akathisia:  No  Handed:  Right  AIMS (if indicated): Denies tremors, rigidity  Assets:  Communication Skills Desire for Improvement Housing Social Support  ADL's:  Intact  Cognition: baseline  Sleep:  Fair   Screenings: AUDIT     Clinical Support from 04/25/2019 in [facility]  Alcohol Use Disorder Identification Test Final Score (AUDIT)  10    GAD-7     Office Visit from 02/23/2019 in [place]  Total GAD-7 Score  4    PHQ2-9     Clinical Support from 04/25/2019 in [place] Visit from 02/23/2019 in [facility] Visit from 05/04/2018 in [facility] from 04/19/2018 in [facility] from 04/10/2017 in [facility]  PHQ-2 Total Score  0  0  2  4  6   PHQ-9 Total Score  --  --  3  5  15        Assessment and Plan: Alman is a 69 year old Caucasian male who has a history of depression, panic attacks, cognitive impairment, alcohol use disorder, tobacco use disorder was evaluated by telemedicine today.  Patient with history of left-sided thalamic stroke, cognitive impairment is currently doing well on current medications.  Patient with comorbid substance abuse problems-alcoholism and is not willing to quit.  Patient will benefit from the following  plan.  Plan Depressive disorder due to medical condition left-sided stroke-in remission Cymbalta 60 mg p.o. daily BuSpar as prescribed Continue CBT as needed  Panic attacks-stable BuSpar 30 mg p.o. twice daily Hydroxyzine 25 mg p.o. daily as  needed for severe anxiety attacks  Alcohol use disorder-some progress Patient currently uses 4, 12 ounce beers per day and is not willing to quit.  Tobacco use disorder-unstable Patient is not ready to quit, provided counseling.  Mild cognitive disorder-chronic Patient continues to have good support system from his wife.  Patient continues to struggle with memory problems and is unable to provide much history about the past month or his past history.  However his wife is very supportive and provided collateral information.  Follow-up in clinic in 6 months or sooner if needed.  June 29 at 4 PM  I have spent atleast 20 minutes non face to face with patient today. More than 50 % of the time was spent for  obtaining and to review and separately obtained history , ordering medications and test ,psychoeducation and supportive psychotherapy and care coordination,as well as documenting clinical information in electronic health record.  This note was generated in part or whole with voice recognition software. Voice recognition is usually quite accurate but there are transcription errors that can and very often do occur. I apologize for any typographical errors that were not detected and corrected.        Ursula Alert, MD 05/25/2019, 4:20 PM

## 2019-06-07 ENCOUNTER — Ambulatory Visit (INDEPENDENT_AMBULATORY_CARE_PROVIDER_SITE_OTHER): Payer: Medicare Other

## 2019-06-07 ENCOUNTER — Other Ambulatory Visit: Payer: Self-pay

## 2019-06-07 DIAGNOSIS — Z86718 Personal history of other venous thrombosis and embolism: Secondary | ICD-10-CM

## 2019-06-07 LAB — POCT INR
INR: 3.2 — AB (ref 2.0–3.0)
PT: 38.1

## 2019-06-07 NOTE — Patient Instructions (Signed)
Description    Skip for 3 days then Continue 10 mg daily, except 5 mg on Monday, Wednesday, Friday  Recheck: 3 weeks

## 2019-06-17 ENCOUNTER — Ambulatory Visit (INDEPENDENT_AMBULATORY_CARE_PROVIDER_SITE_OTHER): Payer: Medicare Other | Admitting: Family Medicine

## 2019-06-17 ENCOUNTER — Telehealth: Payer: Self-pay

## 2019-06-17 ENCOUNTER — Other Ambulatory Visit: Payer: Self-pay

## 2019-06-17 ENCOUNTER — Encounter: Payer: Self-pay | Admitting: Family Medicine

## 2019-06-17 VITALS — BP 98/60 | HR 99 | Temp 97.1°F | Ht 71.0 in | Wt 220.8 lb

## 2019-06-17 DIAGNOSIS — Z86718 Personal history of other venous thrombosis and embolism: Secondary | ICD-10-CM

## 2019-06-17 DIAGNOSIS — Z1211 Encounter for screening for malignant neoplasm of colon: Secondary | ICD-10-CM

## 2019-06-17 DIAGNOSIS — Z125 Encounter for screening for malignant neoplasm of prostate: Secondary | ICD-10-CM

## 2019-06-17 DIAGNOSIS — F320 Major depressive disorder, single episode, mild: Secondary | ICD-10-CM

## 2019-06-17 DIAGNOSIS — I10 Essential (primary) hypertension: Secondary | ICD-10-CM

## 2019-06-17 DIAGNOSIS — Z7901 Long term (current) use of anticoagulants: Secondary | ICD-10-CM | POA: Diagnosis not present

## 2019-06-17 MED ORDER — AMLODIPINE BESY-BENAZEPRIL HCL 5-10 MG PO CAPS: 1.0000 | ORAL_CAPSULE | Freq: Every day | ORAL | 3 refills | Status: DC

## 2019-06-17 NOTE — Patient Instructions (Addendum)
.   Please review the attached list of medications and notify my office if there are any errors.   . Please bring all of your medications to every appointment so we can make sure that our medication list is the same as yours.    You are due for screening colonoscopy to detect precancerous polyps. You should get a call from [facility] to schedule this soon.

## 2019-06-17 NOTE — Progress Notes (Signed)
[name] Male    DOB: 1950-12-15   69 y.o.   MRN: [id number] Visit Date: 06/17/2019  Today's Provider: Lelon Huh, MD   Chief Complaint  Patient presents with  . Hypertension  . Cerebrovascular Accident   Subjective:     HPI  Hypertension, follow-up:  BP Readings from Last 3 Encounters:  06/17/19 99/63  05/04/18 132/76  04/19/18 (!) 152/80    He was last seen for hypertension 1 years ago.  BP at that visit was 132/76. Management since that visit includes no changes. He reports good compliance with treatment. He is not having side effects.  He is exercising. He is not adherent to low salt diet.   Outside blood pressures are not being checked. He is experiencing none.  Patient denies chest pain, chest pressure/discomfort, irregular heart beat and palpitations.   Cardiovascular risk factors include advanced age (older than 31 for men, 76 for women), hypertension and male gender.  Use of agents associated with hypertension: none.     Weight trend: fluctuating a bit Wt Readings from Last 3 Encounters:  06/17/19 220 lb 12.8 oz (100.2 kg)  05/04/18 215 lb (97.5 kg)  04/19/18 219 lb 9.6 oz (99.6 kg)    Current diet: in general, a "healthy" diet    ------------------------------------------------------------------------  Follow up for Cerebrovascular Accident, Late effects:  The patient was last seen for this 1 years ago. Changes made at last visit include none; patient was stable.  He reports good compliance with treatment. He feels that condition is Unchanged. He is not having side effects.   ------------------------------------------------------------------------------------  Allergies  Allergen Reactions  . Chantix [Varenicline Tartrate]     confusion     Current Outpatient Medications:  .  acetaminophen (TYLENOL) 325 MG tablet, Take 2 tablets (650 mg total) by mouth every 6 (six) hours as needed for pain., Disp: 60 tablet,  Rfl: 0 .  amLODipine-benazepril (LOTREL) 5-20 MG capsule, TAKE 1 CAPSULE EVERY DAY, Disp: 90 capsule, Rfl: 4 .  amoxicillin (AMOXIL) 500 MG tablet, TAKE 4 TABLETS BY MOUTH 1 HOUR PRIOR TO DENTAL APPOINTMENT, Disp: , Rfl:  .  busPIRone (BUSPAR) 30 MG tablet, TAKE 1 TABLET (30 MG TOTAL) BY MOUTH 2 (TWO) TIMES DAILY., Disp: 180 tablet, Rfl: 1 .  DULoxetine (CYMBALTA) 60 MG capsule, Take 1 capsule (60 mg total) by mouth daily., Disp: 90 capsule, Rfl: 1 .  fluticasone (FLONASE) 50 MCG/ACT nasal spray, Place 1 spray into both nostrils as needed. , Disp: , Rfl:  .  hydrOXYzine (VISTARIL) 25 MG capsule, Take 1 capsule (25 mg total) by mouth daily as needed for anxiety (for sever panic sx)., Disp: 90 capsule, Rfl: 1 .  montelukast (SINGULAIR) 10 MG tablet, TAKE 1 TABLET DAILY FOR COUGH, NASAL DRAINAGE AND ALLERGIES, Disp: 90 tablet, Rfl: 4 .  warfarin (COUMADIN) 5 MG tablet, TAKE 1 OR 2 TABLETS (5 OR 10MG ) DAILY AS DIRECTED BY PHYSICIAN, Disp: 180 tablet, Rfl: 3  Review of Systems  Constitutional: Negative for appetite change, chills, fatigue and fever.  HENT: Negative for congestion, ear pain, hearing loss, nosebleeds and trouble swallowing.   Eyes: Negative for pain and visual disturbance.  Respiratory: Negative for cough, chest tightness and shortness of breath.   Cardiovascular: Negative for chest pain, palpitations and leg swelling.  Gastrointestinal: Negative for abdominal pain, blood in stool, constipation, diarrhea, nausea and vomiting.  Endocrine: Negative for polydipsia, polyphagia and polyuria.  Genitourinary: Negative for dysuria and flank  pain.  Musculoskeletal: Negative for arthralgias, back pain, joint swelling, myalgias and neck stiffness.  Skin: Negative for color change, rash and wound.  Neurological: Negative for dizziness, tremors, seizures, speech difficulty, weakness, light-headedness and headaches.  Psychiatric/Behavioral: Negative for behavioral problems, confusion, decreased  concentration, dysphoric mood and sleep disturbance. The patient is not nervous/anxious.   All other systems reviewed and are negative.   Social History   Tobacco Use  . Smoking status: Heavy Tobacco Smoker    Packs/day: 2.00    Years: 49.00    Pack years: 98.00    Types: Cigarettes    Last attempt to quit: 06/13/2016    Years since quitting: 3.0  . Smokeless tobacco: Never Used  . Tobacco comment: started smoking at age 87  Substance Use Topics  . Alcohol use: Yes    Alcohol/week: 28.0 - 42.0 standard drinks    Types: 28 - 42 Cans of beer per week    Comment: 4-6 beers a day      Objective:   BP 99/63 (BP Location: Right Arm, Patient Position: Sitting, Cuff Size: Large)   Pulse 99   Temp (!) 97.1 F (36.2 C) (Temporal)   Ht 5\' 11"  (1.803 m)   Wt 220 lb 12.8 oz (100.2 kg)   SpO2 99%   BMI 30.80 kg/m  Vitals:   06/17/19 1400 06/17/19 1415  BP: 99/63 98/60  Pulse: 99   Temp: (!) 97.1 F (36.2 C)   TempSrc: Temporal   SpO2: 99%   Weight: 220 lb 12.8 oz (100.2 kg)   Height: 5\' 11"  (1.803 m)   Body mass index is 30.8 kg/m.   Physical Exam   General: Appearance:    Obese male in no acute distress  Eyes:    PERRL, conjunctiva/corneas clear, EOM's intact       Lungs:     Clear to auscultation bilaterally, respirations unlabored  Heart:    Normal heart rate. Normal rhythm. No murmurs, rubs, or gallops.   MS:   All extremities are intact.   Neurologic:   Awake, alert, oriented x 3. No apparent focal neurological           defect.          Assessment & Plan    1. Essential hypertension Reduce- amLODipine-benazepril (LOTREL) to 5-10 MG capsule; Take 1 capsule by mouth daily.  Dispense: 90 capsule; Refill: 3  - Comprehensive metabolic panel - Lipid panel  Recheck BP in about 2 months.   2. Anticoagulant long-term use Doing well on current warfarin dosing.  - CBC  3. MDD (major depressive disorder), single episode, mild ([place]) Doing well on current  psychotropic medications   5. Prostate cancer screening  - PSA  6. History of DVT in adulthood   7. Colon cancer screening  - Ambulatory referral to gastroenterology for colonoscopy  The entirety of the information documented in the History of Present Illness, Review of Systems and Physical Exam were personally obtained by me. Portions of this information were initially documented by Idelle Jo, CMA and reviewed by me for thoroughness and accuracy.     Lelon Huh, MD  [facility]

## 2019-06-17 NOTE — Telephone Encounter (Signed)
Copied from [place] [phone]. Topic: General - Other >> Jun 17, 2019  4:38 PM Greggory Keen D wrote: Reason for CRM:  Pt had an appt for a CPE at 2:00 today.  Wife called saying Dr. Caryn Section told him it was time to do a colonoscopy and that he would refer him to a GI.  Pt is on Coumadin and wife says he cannot go of of his coumadin to get a colonoscopy.Marland Kitchen    CB#  [phone]

## 2019-06-18 LAB — CBC
Hematocrit: 39.9 % (ref 37.5–51.0)
Hemoglobin: 14 g/dL (ref 13.0–17.7)
MCH: 33.3 pg — ABNORMAL HIGH (ref 26.6–33.0)
MCHC: 35.1 g/dL (ref 31.5–35.7)
MCV: 95 fL (ref 79–97)
Platelets: 183 10*3/uL (ref 150–450)
RBC: 4.21 x10E6/uL (ref 4.14–5.80)
RDW: 13.3 % (ref 11.6–15.4)
WBC: 7.7 10*3/uL (ref 3.4–10.8)

## 2019-06-18 LAB — LIPID PANEL
Chol/HDL Ratio: 2.4 ratio (ref 0.0–5.0)
Cholesterol, Total: 160 mg/dL (ref 100–199)
HDL: 66 mg/dL (ref >39)
LDL Chol Calc (NIH): 82 mg/dL (ref 0–99)
Triglycerides: 62 mg/dL (ref 0–149)
VLDL Cholesterol Cal: 12 mg/dL (ref 5–40)

## 2019-06-18 LAB — COMPREHENSIVE METABOLIC PANEL
ALT: 17 IU/L (ref 0–44)
AST: 21 IU/L (ref 0–40)
Albumin/Globulin Ratio: 1.7 (ref 1.2–2.2)
Albumin: 4 g/dL (ref 3.8–4.8)
Alkaline Phosphatase: 47 IU/L (ref 39–117)
BUN/Creatinine Ratio: 12 (ref 10–24)
BUN: 13 mg/dL (ref 8–27)
Bilirubin Total: 0.3 mg/dL (ref 0.0–1.2)
CO2: 22 mmol/L (ref 20–29)
Calcium: 9 mg/dL (ref 8.6–10.2)
Chloride: 103 mmol/L (ref 96–106)
Creatinine, Ser: 1.13 mg/dL (ref 0.76–1.27)
GFR calc Af Amer: 77 mL/min/{1.73_m2} (ref >59)
GFR calc non Af Amer: 66 mL/min/{1.73_m2} (ref >59)
Globulin, Total: 2.3 g/dL (ref 1.5–4.5)
Glucose: 57 mg/dL — ABNORMAL LOW (ref 65–99)
Potassium: 4.1 mmol/L (ref 3.5–5.2)
Sodium: 139 mmol/L (ref 134–144)
Total Protein: 6.3 g/dL (ref 6.0–8.5)

## 2019-06-18 LAB — PSA: Prostate Specific Ag, Serum: 1.2 ng/mL (ref 0.0–4.0)

## 2019-06-18 NOTE — Addendum Note (Signed)
Addended by: Birdie Sons on: 06/18/2019 07:52 AM   Modules accepted: Orders

## 2019-06-18 NOTE — Telephone Encounter (Signed)
Can do Cologuard instead. Will only need to do colonoscopy if Cologuard is positive. If that is necessary it will be OK to hold coumadin for a few days.

## 2019-06-20 NOTE — Telephone Encounter (Signed)
Patient's wife advised

## 2019-06-20 NOTE — Telephone Encounter (Signed)
-----   Message from Birdie Sons, MD sent at 06/18/2019  8:01 AM EST ----- Labs are good. Go ahead and change BP medication (lotrel) to 5-10mg  tablets once a day. Rx was sent to mail ordered. Follow up for BP check in April as scheduled.

## 2019-06-26 ENCOUNTER — Other Ambulatory Visit: Payer: Self-pay | Admitting: Family Medicine

## 2019-06-28 ENCOUNTER — Other Ambulatory Visit: Payer: Self-pay

## 2019-06-28 ENCOUNTER — Ambulatory Visit (INDEPENDENT_AMBULATORY_CARE_PROVIDER_SITE_OTHER): Payer: Medicare Other

## 2019-06-28 DIAGNOSIS — Z86718 Personal history of other venous thrombosis and embolism: Secondary | ICD-10-CM | POA: Diagnosis not present

## 2019-06-28 LAB — POCT INR
INR: 3.5 — AB (ref 2.0–3.0)
PT: 42.1

## 2019-06-28 NOTE — Patient Instructions (Signed)
Change to 10mg  M, F and 5mg  the rest of the week.  Recheck in two weeks.

## 2019-07-12 ENCOUNTER — Other Ambulatory Visit: Payer: Self-pay

## 2019-07-12 ENCOUNTER — Ambulatory Visit (INDEPENDENT_AMBULATORY_CARE_PROVIDER_SITE_OTHER): Payer: Medicare Other

## 2019-07-12 DIAGNOSIS — Z86718 Personal history of other venous thrombosis and embolism: Secondary | ICD-10-CM | POA: Diagnosis not present

## 2019-07-12 LAB — POCT INR
INR: 2.5 (ref 2.0–3.0)
PT: 30.5

## 2019-07-12 NOTE — Patient Instructions (Signed)
Description   No changes Continue 10 mg Monday and Friday, except 5 mg daily  Recheck: 4 weeks

## 2019-07-26 ENCOUNTER — Other Ambulatory Visit: Payer: Self-pay

## 2019-07-26 ENCOUNTER — Ambulatory Visit (INDEPENDENT_AMBULATORY_CARE_PROVIDER_SITE_OTHER): Payer: Medicare Other

## 2019-07-26 DIAGNOSIS — Z86718 Personal history of other venous thrombosis and embolism: Secondary | ICD-10-CM

## 2019-07-26 LAB — POCT INR
INR: 2.2 (ref 2.0–3.0)
PT: 26.3

## 2019-07-26 NOTE — Patient Instructions (Signed)
Description   No changes Continue 10 mg Monday and Friday, except 5 mg daily  Recheck: 4 weeks

## 2019-08-09 ENCOUNTER — Ambulatory Visit: Payer: Self-pay

## 2019-08-16 NOTE — Progress Notes (Deleted)
Established patient visit    [name]   DOB: Feb 12, 1951   69 y.o. Male  MRN: [id number] Visit Date: 08/16/2019  Today's healthcare provider: Lelon Huh, MD   No chief complaint on file.  Subjective    HPI Hypertension, follow-up  BP Readings from Last 3 Encounters:  06/17/19 98/60  05/04/18 132/76  04/19/18 (!) 152/80   Wt Readings from Last 3 Encounters:  06/17/19 220 lb 12.8 oz (100.2 kg)  05/04/18 215 lb (97.5 kg)  04/19/18 219 lb 9.6 oz (99.6 kg)     He was last seen for hypertension 2 months ago.  BP at that visit was 98/60. Management since that visit includes reducing amLODipine-benazepril (LOTREL) to 5-10 MG capsule.  He reports {excellent/good/fair/poor:19665} compliance with treatment. He {is/is not:9024} having side effects. {document side effects if present:1} He is following a {diet:21022986} diet. He {is/is not:9024} exercising. He {does/does not:200015} smoke.  Use of agents associated with hypertension: {bp agents assoc with hypertension:511::"none"}.   Outside blood pressures are {***enter patient reported home BP readings, or 'not being checked':1}.  Symptoms:  YES NO    []    []    Chest Pain   []    []    Chest pressure/discomfort   []    []    Palpitations   []    []    Dyspnea   []    []    Orthopnea   []    []    Paroxysmal nocturnal dyspnea   []    []    Lower extremity edema   []    []   Syncope   Pertinent labs: Lab Results  Component Value Date   CHOL 160 06/17/2019   HDL 66 06/17/2019   LDLCALC 82 06/17/2019   TRIG 62 06/17/2019   CHOLHDL 2.4 06/17/2019   Lab Results  Component Value Date   NA 139 06/17/2019   K 4.1 06/17/2019   CO2 22 06/17/2019   GLUCOSE 57 (L) 06/17/2019   BUN 13 06/17/2019   CREATININE 1.13 06/17/2019   CALCIUM 9.0 06/17/2019   GFRNONAA 66 06/17/2019   GFRAA 77 06/17/2019     The 10-year ASCVD risk score Mikey Bussing DC Jr., et al., 2013) is: 11.5%    ---------------------------------------------------------------------------------------------------  {Show patient history (optional):23778::" "}   Medications: Outpatient Medications Prior to Visit  Medication Sig  . acetaminophen (TYLENOL) 325 MG tablet Take 2 tablets (650 mg total) by mouth every 6 (six) hours as needed for pain.  Marland Kitchen amLODipine-benazepril (LOTREL) 5-10 MG capsule Take 1 capsule by mouth daily.  Marland Kitchen amoxicillin (AMOXIL) 500 MG tablet TAKE 4 TABLETS BY MOUTH 1 HOUR PRIOR TO DENTAL APPOINTMENT  . busPIRone (BUSPAR) 30 MG tablet TAKE 1 TABLET (30 MG TOTAL) BY MOUTH 2 (TWO) TIMES DAILY.  . DULoxetine (CYMBALTA) 60 MG capsule Take 1 capsule (60 mg total) by mouth daily.  . fluticasone (FLONASE) 50 MCG/ACT nasal spray Place 1 spray into both nostrils as needed.   . hydrOXYzine (VISTARIL) 25 MG capsule Take 1 capsule (25 mg total) by mouth daily as needed for anxiety (for sever panic sx).  . montelukast (SINGULAIR) 10 MG tablet TAKE 1 TABLET DAILY FOR COUGH, NASAL DRAINAGE AND ALLERGIES  . warfarin (COUMADIN) 5 MG tablet TAKE 1 OR 2 TABLETS (5 OR 10MG ) DAILY AS DIRECTED BY PHYSICIAN   No facility-administered medications prior to visit.    Review of Systems  Constitutional: Negative for appetite change, chills and fever.  Respiratory: Negative for chest tightness, shortness of breath and wheezing.  Cardiovascular: Negative for chest pain and palpitations.  Gastrointestinal: Negative for abdominal pain, nausea and vomiting.    {Show previous labs (optional):23779::" "}   Objective    There were no vitals taken for this visit. {Show previous vital signs (optional):23777::" "}  Physical Exam  ***  No results found for any visits on 08/22/19.   Assessment & Plan    ***  No follow-ups on file.      {provider attestation***:1}   Lelon Huh, MD  [facility] [phone] (phone) [phone] (fax)  [place]

## 2019-08-22 ENCOUNTER — Ambulatory Visit: Payer: Medicare Other | Admitting: Family Medicine

## 2019-08-23 ENCOUNTER — Other Ambulatory Visit: Payer: Self-pay

## 2019-08-23 ENCOUNTER — Ambulatory Visit (INDEPENDENT_AMBULATORY_CARE_PROVIDER_SITE_OTHER): Payer: Medicare Other | Admitting: *Deleted

## 2019-08-23 DIAGNOSIS — Z86718 Personal history of other venous thrombosis and embolism: Secondary | ICD-10-CM | POA: Diagnosis not present

## 2019-08-23 LAB — POCT INR
INR: 1.9 — AB (ref 2.0–3.0)
PT: 22.9

## 2019-08-23 NOTE — Patient Instructions (Signed)
No changes Continue 10 mg Monday and Friday, except 5 mg daily  Recheck: 4 weeks

## 2019-09-13 DIAGNOSIS — D2272 Melanocytic nevi of left lower limb, including hip: Secondary | ICD-10-CM | POA: Diagnosis not present

## 2019-09-13 DIAGNOSIS — L821 Other seborrheic keratosis: Secondary | ICD-10-CM | POA: Diagnosis not present

## 2019-09-13 DIAGNOSIS — D2271 Melanocytic nevi of right lower limb, including hip: Secondary | ICD-10-CM | POA: Diagnosis not present

## 2019-09-13 DIAGNOSIS — D485 Neoplasm of uncertain behavior of skin: Secondary | ICD-10-CM | POA: Diagnosis not present

## 2019-09-13 DIAGNOSIS — D2261 Melanocytic nevi of right upper limb, including shoulder: Secondary | ICD-10-CM | POA: Diagnosis not present

## 2019-09-13 DIAGNOSIS — D225 Melanocytic nevi of trunk: Secondary | ICD-10-CM | POA: Diagnosis not present

## 2019-09-13 DIAGNOSIS — D2262 Melanocytic nevi of left upper limb, including shoulder: Secondary | ICD-10-CM | POA: Diagnosis not present

## 2019-09-13 DIAGNOSIS — L7211 Pilar cyst: Secondary | ICD-10-CM | POA: Diagnosis not present

## 2019-09-20 ENCOUNTER — Ambulatory Visit: Payer: Medicare Other

## 2019-09-20 ENCOUNTER — Other Ambulatory Visit: Payer: Self-pay

## 2019-09-20 ENCOUNTER — Ambulatory Visit (INDEPENDENT_AMBULATORY_CARE_PROVIDER_SITE_OTHER): Payer: Medicare Other

## 2019-09-20 DIAGNOSIS — Z86718 Personal history of other venous thrombosis and embolism: Secondary | ICD-10-CM | POA: Diagnosis not present

## 2019-09-20 LAB — POCT INR
INR: 2.1 (ref 2.0–3.0)
PT: 25.3

## 2019-09-20 NOTE — Patient Instructions (Signed)
Description   No changes Continue 10 mg Monday and Friday, except 5 mg daily  Recheck: 4 weeks

## 2019-10-12 DIAGNOSIS — L7211 Pilar cyst: Secondary | ICD-10-CM | POA: Diagnosis not present

## 2019-10-12 DIAGNOSIS — R208 Other disturbances of skin sensation: Secondary | ICD-10-CM | POA: Diagnosis not present

## 2019-10-12 DIAGNOSIS — L728 Other follicular cysts of the skin and subcutaneous tissue: Secondary | ICD-10-CM | POA: Diagnosis not present

## 2019-10-18 ENCOUNTER — Other Ambulatory Visit: Payer: Self-pay

## 2019-10-18 ENCOUNTER — Ambulatory Visit (INDEPENDENT_AMBULATORY_CARE_PROVIDER_SITE_OTHER): Payer: Medicare Other

## 2019-10-18 DIAGNOSIS — Z86718 Personal history of other venous thrombosis and embolism: Secondary | ICD-10-CM

## 2019-10-18 LAB — POCT INR
INR: 1.5 — AB (ref 2.0–3.0)
PT: 18.3

## 2019-10-18 NOTE — Addendum Note (Signed)
Addended by: Althea Charon D on: 10/18/2019 10:00 AM   Modules accepted: Level of Service

## 2019-10-18 NOTE — Patient Instructions (Signed)
Description   5 mg daily except 10 mg on M & F F/U 2 weeks

## 2019-10-25 ENCOUNTER — Other Ambulatory Visit: Payer: Self-pay

## 2019-10-25 ENCOUNTER — Telehealth (INDEPENDENT_AMBULATORY_CARE_PROVIDER_SITE_OTHER): Payer: Medicare Other | Admitting: Psychiatry

## 2019-10-25 ENCOUNTER — Encounter: Payer: Self-pay | Admitting: Psychiatry

## 2019-10-25 ENCOUNTER — Other Ambulatory Visit: Payer: Self-pay | Admitting: Psychiatry

## 2019-10-25 DIAGNOSIS — F1010 Alcohol abuse, uncomplicated: Secondary | ICD-10-CM

## 2019-10-25 DIAGNOSIS — F09 Unspecified mental disorder due to known physiological condition: Secondary | ICD-10-CM

## 2019-10-25 DIAGNOSIS — F410 Panic disorder [episodic paroxysmal anxiety] without agoraphobia: Secondary | ICD-10-CM

## 2019-10-25 DIAGNOSIS — F0631 Mood disorder due to known physiological condition with depressive features: Secondary | ICD-10-CM | POA: Diagnosis not present

## 2019-10-25 DIAGNOSIS — F17200 Nicotine dependence, unspecified, uncomplicated: Secondary | ICD-10-CM

## 2019-10-25 MED ORDER — BUSPIRONE HCL 30 MG PO TABS: 30.0000 mg | ORAL_TABLET | Freq: Two times a day (BID) | ORAL | 1 refills | Status: DC

## 2019-10-25 MED ORDER — DULOXETINE HCL 60 MG PO CPEP: 60.0000 mg | ORAL_CAPSULE | Freq: Every day | ORAL | 1 refills | Status: DC

## 2019-10-25 NOTE — Progress Notes (Signed)
Provider Location : [facility] Patient Location : Home  Virtual Visit via Video [name]  I connected with Chase [name] on 10/25/19 at  4:00 PM EDT by a video enabled telemedicine application and verified that I am speaking with the correct person using two identifiers.   I discussed the limitations of evaluation and management by telemedicine and the availability of in person appointments. The patient expressed understanding and agreed to proceed.    I discussed the assessment and treatment plan with the patient. The patient was provided an opportunity to ask questions and all were answered. The patient agreed with the plan and demonstrated an understanding of the instructions.   The patient was advised to call back or seek an in-person evaluation if the symptoms worsen or if the condition fails to improve as anticipated.   Chase [name]  10/25/2019 5:30 PM Chase [name]  MRN:  [id number]  Chief Complaint:  Chief Complaint    Follow-up     HPI: Chase [name] is a 69 year old Caucasian male who has a history of depression, panic attacks, cognitive disorder, alcohol use disorder, tobacco use disorder was evaluated by telemedicine today.  Patient today reports he is currently stable on current medication regimen.  He does have mild anxiety symptoms on and off however overall he is able to cope with it and distract himself.  He reports he had an anxiety attack this morning however he was able to walk outside get some fresh air, drank a can of Coke and that helped.  He uses the hydroxyzine very sporadically only.  Patient reports sleep is good.  Patient denies any suicidality, homicidality or perceptual disturbances.  Patient continues to smoke cigarettes however is interested in quitting within the next 1 year.  He has tried a lot of techniques in the past including classes and is not ready to try them anymore.  Patient also continues to drink beer 4 beers per per day.  He is  not willing to cut back or quit.  Patient denies any suicidality, homicidality or perceptual disturbances.  Patient denies any other concerns today.      Visit Diagnosis:    ICD-10-CM   1. 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 Panic attacks  F41.0 busPIRone (BUSPAR) 30 MG tablet    DULoxetine (CYMBALTA) 60 MG capsule  3. Tobacco use disorder  F17.200   4. Alcohol use disorder, mild, abuse  F10.10   5. Mild cognitive disorder  F09     Past Psychiatric History: I have reviewed past psychiatric history from my progress [name] on 08/10/2017.  Past trials of Effexor, Wellbutrin, Cymbalta, Xanax, Klonopin.  Past Medical History:  Past Medical History:  Diagnosis Date  . Cerebrovascular accident (CVA) ([place]) 10/09/2011  . Clotting disorder ([place])   . DVT (deep venous thrombosis) ([place])   . Hearing deficit   . History of ischemic vertebrobasilar artery thalamic stroke 2001  . History of stroke   . Hyperlipidemia   . Hypertension   . Patent foramen ovale   . Right knee DJD   . Stroke [facility]) 2001    Past Surgical History:  Procedure Laterality Date  . APPENDECTOMY    . FRACTURE SURGERY     R leg  . INCISION AND DRAINAGE Right 06/30/2012   Procedure: INCISION AND DRAINAGE;  Surgeon: Lorn Junes, MD;  Location: [place];  Service: Orthopedics;  Laterality: Right;  . KNEE ARTHROSCOPY  right knee  . KNEE BURSECTOMY Right 06/30/2012   Procedure: KNEE BURSECTOMY;  Surgeon: Lorn Junes, MD;  Location: [place];  Service: Orthopedics;  Laterality: Right;  Pre-patellar with wound closure  . TOTAL KNEE ARTHROPLASTY  01/26/2012   Procedure: TOTAL KNEE ARTHROPLASTY;  Surgeon: Lorn Junes, MD;  Location: [place];  Service: Orthopedics;  Laterality: Right;    Family Psychiatric History: I have reviewed family psychiatric history from my progress [name] on 08/10/2017.  Family History:  Family History  Problem Relation Age of Onset  . Cancer  Father   . Seizures Brother     Social History: I have reviewed social history from my progress [name] on 08/10/2017. Social History   Socioeconomic History  . Marital status: Married    Spouse name: debra  . Number of children: 0  . Years of education: 51  . Highest education level: Master's degree (e.g., MA, MS, MEng, MEd, MSW, MBA)  Occupational History  . Occupation: retired  . Occupation: disabled @ age 57  Tobacco Use  . Smoking status: Heavy Tobacco Smoker    Packs/day: 2.00    Years: 49.00    Pack years: 98.00    Types: Cigarettes    Last attempt to quit: 06/13/2016    Years since quitting: 3.3  . Smokeless tobacco: Never Used  . Tobacco comment: started smoking at age 42  Vaping Use  . Vaping Use: Never used  Substance and Sexual Activity  . Alcohol use: Yes    Alcohol/week: 28.0 - 42.0 standard drinks    Types: 28 - 42 Cans of beer per week    Comment: 4-6 beers a day  . Drug use: No  . Sexual activity: Not on file  Other Topics Concern  . Not on file  Social History Narrative  . Not on file   Social Determinants of Health   Financial Resource Strain: Low Risk   . Difficulty of Paying Living Expenses: Not hard at all  Food Insecurity: No Food Insecurity  . Worried About Charity fundraiser in the Last Year: Never true  . Ran Out of Food in the Last Year: Never true  Transportation Needs: No Transportation Needs  . Lack of Transportation (Medical): No  . Lack of Transportation (Non-Medical): No  Physical Activity: Inactive  . Days of Exercise per Week: 0 days  . Minutes of Exercise per Session: 0 min  Stress: No Stress Concern Present  . Feeling of Stress : Not at all  Social Connections: Socially Isolated  . Frequency of Communication with Friends and Family: Never  . Frequency of Social Gatherings with Friends and Family: Once a week  . Attends Religious Services: Never  . Active Member of Clubs or Organizations: No  . Attends Archivist  Meetings: Never  . Marital Status: Married    Allergies:  Allergies  Allergen Reactions  . Chantix [Varenicline Tartrate]     confusion    Metabolic Disorder Labs: No results found for: HGBA1C, MPG No results found for: PROLACTIN Lab Results  Component Value Date   CHOL 160 06/17/2019   TRIG 62 06/17/2019   HDL 66 06/17/2019   CHOLHDL 2.4 06/17/2019   LDLCALC 82 06/17/2019   LDLCALC 97 05/04/2018   Lab Results  Component Value Date   TSH 2.25 03/13/2017    Therapeutic Level Labs: No results found for: LITHIUM No results found for: VALPROATE No components found for:  CBMZ  Current Medications: Current Outpatient Medications  Medication Sig Dispense Refill  . acetaminophen (TYLENOL) 325 MG tablet Take 2 tablets (650 mg total) by mouth every 6 (six) hours as needed for pain. 60 tablet 0  . amLODipine-benazepril (LOTREL) 5-10 MG capsule Take 1 capsule by mouth daily. 90 capsule 3  . amoxicillin (AMOXIL) 500 MG tablet TAKE 4 TABLETS BY MOUTH 1 HOUR PRIOR TO DENTAL APPOINTMENT    . busPIRone (BUSPAR) 30 MG tablet Take 1 tablet (30 mg total) by mouth 2 (two) times daily. 180 tablet 1  . DULoxetine (CYMBALTA) 60 MG capsule Take 1 capsule (60 mg total) by mouth daily. 90 capsule 1  . fluticasone (FLONASE) 50 MCG/ACT nasal spray Place 1 spray into both nostrils as needed.     . hydrOXYzine (VISTARIL) 25 MG capsule Take 1 capsule (25 mg total) by mouth daily as needed for anxiety (for sever panic sx). 90 capsule 1  . montelukast (SINGULAIR) 10 MG tablet TAKE 1 TABLET DAILY FOR COUGH, NASAL DRAINAGE AND ALLERGIES 90 tablet 4  . traMADol (ULTRAM) 50 MG tablet     . warfarin (COUMADIN) 5 MG tablet TAKE 1 OR 2 TABLETS (5 OR 10MG ) DAILY AS DIRECTED BY PHYSICIAN 180 tablet 3   No current facility-administered medications for this visit.     Musculoskeletal: Strength & Muscle Tone: UTA Gait & Station: normal Patient leans: N/A  Psychiatric Specialty Exam: Review of Systems   Psychiatric/Behavioral: Negative for agitation, behavioral problems, confusion, decreased concentration, dysphoric mood, hallucinations, self-injury, sleep disturbance and suicidal ideas. The patient is not nervous/anxious and is not hyperactive.   All other systems reviewed and are negative.   There were no vitals taken for this visit.There is no height or weight on file to calculate BMI.  General Appearance: Casual  Eye Contact:  Fair  Speech:  Clear and Coherent  Volume:  Normal  Mood:  Euthymic  Affect:  Congruent  Thought Process:  Goal Directed and Descriptions of Associations: Intact  Orientation:  Other:  Person, self, situation  Thought Content: Logical   Suicidal Thoughts:  No  Homicidal Thoughts:  No  Memory:  Immediate;   Fair Recent;   limited Remote;   limited  Judgement:  Fair  Insight:  Fair  Psychomotor Activity:  Normal  Concentration:  Concentration: Fair and Attention Span: Fair  Recall:  [employer] of Knowledge: Fair  Language: Fair  Akathisia:  No  Handed:  Right  AIMS (if indicated): UTA  Assets:  Communication Skills Desire for Improvement Housing Social Support  ADL's:  Intact  Cognition: WNL  Sleep:  Fair   Screenings: AUDIT     Clinical Support from 04/25/2019 in [facility]  Alcohol Use Disorder Identification Test Final Score (AUDIT) 10    GAD-7     Office Visit from 02/23/2019 in [place]  Total GAD-7 Score 4    PHQ2-9     Clinical Support from 04/25/2019 in [place] Visit from 02/23/2019 in [place] Office Visit from 05/04/2018 in [place] from 04/19/2018 in [place] from 04/10/2017 in [place]  PHQ-2 Total Score 0 0 2 4 6   PHQ-9 Total Score -- -- 3 5 15        Assessment and Plan: Chase [name] is a 69 year old Caucasian male who has a  history of depression, panic attacks, cognitive impairment, alcohol use disorder, tobacco use disorder was evaluated by telemedicine today.  Patient with history  of left-sided thalamic stroke, cognitive impairment is currently doing well on current medications.  Patient with comorbid substance abuse problems-alcoholism continues to be not willing to quit.  Discussed plan as noted below.  Plan Depressive disorder due to medical condition-left-sided stroke in remission Cymbalta 60 mg p.o. daily BuSpar as prescribed Continue CBT as needed  Panic attacks-stable BuSpar 30 mg p.o. twice daily Hydroxyzine 25 mg p.o. daily as needed for severe anxiety attacks  Alcohol use disorder-some progress Patient currently uses 4, 12 ounce beers per day and is not willing to quit Provided counseling  Tobacco use disorder-unstable Provided counseling.   Mild cognitive disorder-chronic Patient continues to have good support system from his wife.  Collateral information was obtained from wife-[name] who reports patient is doing well.  Follow-up in clinic in 6 months or sooner if needed.  I have spent atleast 20 minutes non  face to face with patient today. More than 50 % of the time was spent for preparing to see the patient ( e.g., review of test, records ), obtaining and to review and separately obtained history , ordering medications and test ,psychoeducation and supportive psychotherapy and care coordination,as well as documenting clinical information in electronic health record. This [name] was generated in part or whole with voice recognition software. Voice recognition is usually quite accurate but there are transcription errors that can and very often do occur. I apologize for any typographical errors that were not detected and corrected.       Ursula Alert, MD 10/25/2019, 5:30 PM

## 2019-11-08 ENCOUNTER — Ambulatory Visit (INDEPENDENT_AMBULATORY_CARE_PROVIDER_SITE_OTHER): Payer: Medicare Other

## 2019-11-08 ENCOUNTER — Other Ambulatory Visit: Payer: Self-pay

## 2019-11-08 DIAGNOSIS — Z86718 Personal history of other venous thrombosis and embolism: Secondary | ICD-10-CM | POA: Diagnosis not present

## 2019-11-08 LAB — POCT INR: INR: 2.1 (ref 2.0–3.0)

## 2019-11-08 NOTE — Patient Instructions (Signed)
Description   5 mg daily except 10 mg on M & F F/U 4 weeks

## 2019-11-16 ENCOUNTER — Other Ambulatory Visit: Payer: Self-pay | Admitting: Family Medicine

## 2019-11-16 DIAGNOSIS — J301 Allergic rhinitis due to pollen: Secondary | ICD-10-CM

## 2019-11-16 NOTE — Telephone Encounter (Signed)
Requested Prescriptions  Pending Prescriptions Disp Refills  . montelukast (SINGULAIR) 10 MG tablet [Pharmacy Med Name: MONTELUKAST SODIUM 10 MG Tablet] 90 tablet 2    Sig: TAKE 1 TABLET DAILY FOR COUGH, NASAL DRAINAGE AND ALLERGIES     Pulmonology:  Leukotriene Inhibitors Passed - 11/16/2019  5:09 AM      Passed - Valid encounter within last 12 months    Recent Outpatient Visits          5 months ago Essential hypertension   [facility] Birdie Sons, MD   1 year ago Essential hypertension   [facility] Birdie Sons, MD   2 years ago Cough   [facility] Birdie Sons, MD   2 years ago Cough with fever   [facility] Birdie Sons, MD   2 years ago Current moderate episode of major depressive disorder without prior episode [facility])   [facility] Birdie Sons, MD

## 2019-12-06 ENCOUNTER — Other Ambulatory Visit: Payer: Self-pay

## 2019-12-06 ENCOUNTER — Ambulatory Visit (INDEPENDENT_AMBULATORY_CARE_PROVIDER_SITE_OTHER): Payer: Medicare Other

## 2019-12-06 DIAGNOSIS — Z86718 Personal history of other venous thrombosis and embolism: Secondary | ICD-10-CM

## 2019-12-06 LAB — POCT INR
INR: 1.9 — AB (ref 2.0–3.0)
Prothrombin Time: 23

## 2019-12-06 NOTE — Patient Instructions (Signed)
Description   5 mg daily except 10 mg on M,W, F F/U 4 weeks     

## 2020-01-03 ENCOUNTER — Ambulatory Visit: Payer: Self-pay

## 2020-01-10 ENCOUNTER — Ambulatory Visit: Payer: Medicare Other

## 2020-01-11 ENCOUNTER — Other Ambulatory Visit: Payer: Self-pay

## 2020-01-11 ENCOUNTER — Ambulatory Visit (INDEPENDENT_AMBULATORY_CARE_PROVIDER_SITE_OTHER): Payer: Medicare Other

## 2020-01-11 DIAGNOSIS — Z86718 Personal history of other venous thrombosis and embolism: Secondary | ICD-10-CM | POA: Diagnosis not present

## 2020-01-11 LAB — POCT INR
INR: 2.7 (ref 2.0–3.0)
PT: 32.9

## 2020-01-11 NOTE — Patient Instructions (Signed)
Continue 5mg  daily, except 10mg  M, W, F.  Recheck in four weeks.

## 2020-01-18 DIAGNOSIS — H43313 Vitreous membranes and strands, bilateral: Secondary | ICD-10-CM | POA: Diagnosis not present

## 2020-02-08 ENCOUNTER — Other Ambulatory Visit: Payer: Self-pay

## 2020-02-08 ENCOUNTER — Ambulatory Visit (INDEPENDENT_AMBULATORY_CARE_PROVIDER_SITE_OTHER): Payer: Medicare Other

## 2020-02-08 DIAGNOSIS — Z86718 Personal history of other venous thrombosis and embolism: Secondary | ICD-10-CM

## 2020-02-08 LAB — POCT INR
INR: 2.4 (ref 2.0–3.0)
PT: 29

## 2020-02-08 NOTE — Patient Instructions (Signed)
Description   5 mg daily except 10 mg on M,W, F F/U 4 weeks     

## 2020-03-07 ENCOUNTER — Other Ambulatory Visit: Payer: Self-pay

## 2020-03-07 ENCOUNTER — Ambulatory Visit (INDEPENDENT_AMBULATORY_CARE_PROVIDER_SITE_OTHER): Payer: Medicare Other

## 2020-03-07 DIAGNOSIS — Z86718 Personal history of other venous thrombosis and embolism: Secondary | ICD-10-CM | POA: Diagnosis not present

## 2020-03-07 LAB — POCT INR
INR: 2.4 (ref 2.0–3.0)
PT: 29.3

## 2020-03-07 NOTE — Patient Instructions (Signed)
Description   5 mg daily except 10 mg on M,W, F F/U 4 weeks

## 2020-04-07 ENCOUNTER — Other Ambulatory Visit: Payer: Self-pay | Admitting: Family Medicine

## 2020-04-07 NOTE — Telephone Encounter (Signed)
Requested medications are due for refill today yes (mail order)  Requested medications are on the active medication list yes  Last refill 9/29   Last visit 05/2019  Future visit scheduled no  Notes to clinic OV note states return in 2 months but no appt was made. Failed protocol due to no valid visit within 6  months.

## 2020-04-11 ENCOUNTER — Ambulatory Visit (INDEPENDENT_AMBULATORY_CARE_PROVIDER_SITE_OTHER): Payer: Medicare Other

## 2020-04-11 ENCOUNTER — Other Ambulatory Visit: Payer: Self-pay | Admitting: Family Medicine

## 2020-04-11 ENCOUNTER — Other Ambulatory Visit: Payer: Self-pay

## 2020-04-11 DIAGNOSIS — Z23 Encounter for immunization: Secondary | ICD-10-CM | POA: Diagnosis not present

## 2020-04-11 DIAGNOSIS — Z86718 Personal history of other venous thrombosis and embolism: Secondary | ICD-10-CM

## 2020-04-11 LAB — POCT INR
INR: 2.5 (ref 2.0–3.0)
PT: 29.5

## 2020-04-11 NOTE — Telephone Encounter (Signed)
Pt's spouse called in to request a refill for amLODipine-benazepril (LOTREL) 5-10 MG capsule.     Pharmacy: [facility] - [address], [address]  [place], [place] [address]  Phone:  [phone] Fax:  [phone]

## 2020-04-11 NOTE — Patient Instructions (Signed)
Description   5 mg daily except 10 mg on M,W, F F/U 4 weeks

## 2020-04-12 ENCOUNTER — Encounter: Payer: Self-pay | Admitting: Psychiatry

## 2020-04-12 ENCOUNTER — Other Ambulatory Visit: Payer: Self-pay

## 2020-04-12 ENCOUNTER — Telehealth (INDEPENDENT_AMBULATORY_CARE_PROVIDER_SITE_OTHER): Payer: Medicare Other | Admitting: Psychiatry

## 2020-04-12 DIAGNOSIS — F17200 Nicotine dependence, unspecified, uncomplicated: Secondary | ICD-10-CM | POA: Diagnosis not present

## 2020-04-12 DIAGNOSIS — F1010 Alcohol abuse, uncomplicated: Secondary | ICD-10-CM

## 2020-04-12 DIAGNOSIS — F410 Panic disorder [episodic paroxysmal anxiety] without agoraphobia: Secondary | ICD-10-CM

## 2020-04-12 DIAGNOSIS — F09 Unspecified mental disorder due to known physiological condition: Secondary | ICD-10-CM

## 2020-04-12 DIAGNOSIS — F0631 Mood disorder due to known physiological condition with depressive features: Secondary | ICD-10-CM | POA: Diagnosis not present

## 2020-04-12 MED ORDER — DULOXETINE HCL 60 MG PO CPEP: 60.0000 mg | ORAL_CAPSULE | Freq: Every day | ORAL | 1 refills | Status: DC

## 2020-04-12 MED ORDER — HYDROXYZINE PAMOATE 25 MG PO CAPS: 25.0000 mg | ORAL_CAPSULE | Freq: Every day | ORAL | 1 refills | Status: DC | PRN

## 2020-04-12 MED ORDER — BUSPIRONE HCL 30 MG PO TABS: 30.0000 mg | ORAL_TABLET | Freq: Two times a day (BID) | ORAL | 1 refills | Status: DC

## 2020-04-12 NOTE — Progress Notes (Signed)
Virtual Visit via Video Note  I connected with Chase [name] on 04/12/20 at  4:00 PM EST by a video enabled telemedicine application and verified that I am speaking with the correct person using two identifiers. Location Provider Location : [facility] Patient Location : Home  Participants: Patient , Spouse,Provider    I discussed the limitations of evaluation and management by telemedicine and the availability of in person appointments. The patient expressed understanding and agreed to proceed.     I discussed the assessment and treatment plan with the patient. The patient was provided an opportunity to ask questions and all were answered. The patient agreed with the plan and demonstrated an understanding of the instructions.   The patient was advised to call back or seek an in-person evaluation if the symptoms worsen or if the condition fails to improve as anticipated.   [place] MD OP Progress Note  04/12/2020 5:31 PM Chase [name]  MRN:  [id number]  Chief Complaint:  Chief Complaint    Follow-up     HPI: Chase [name] is a 69 year old Caucasian male who has a history of depression, panic attacks, cognitive disorder, alcohol use disorder, tobacco use disorder was evaluated by telemedicine today.  Patient today reports he is currently stable on current medication regimen.  Denies any significant depression.  Patient does report anxiety symptoms on and off.  He however reports it last for 30 seconds or so and he is able to focus on his breathing or distract himself and it gets better.  Patient reports sleep as good.  He is able to sleep around 9-1/2 hours at night.  He reports he does wake up in between to urinate which does disrupt his sleep however he wakes up feeling rested.  Patient continues to keep himself busy doing projects around the house.  Patient continues to have cognitive issues and has good social support system from his wife.  Patient denies any suicidality,  homicidality or perceptual disturbances.  Collateral information from wife patient is currently stable.  Patient as well as wife agrees to taper patient down to monotherapy possibly in a few months from now since he has been stable for a long time.  However according to wife she wants to get it done before their scheduled trip sometime in September 2022.  Patient continues to use alcohol and smokes cigarettes and is not willing to cut back or quit.  Visit Diagnosis:    ICD-10-CM   1. 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 Panic attacks  F41.0 hydrOXYzine (VISTARIL) 25 MG capsule    DULoxetine (CYMBALTA) 60 MG capsule    busPIRone (BUSPAR) 30 MG tablet  3. Tobacco use disorder  F17.200   4. Alcohol use disorder, mild, abuse  F10.10   5. Mild cognitive disorder  F09     Past Psychiatric History: I have reviewed past psychiatric history from my progress note on 08/10/2017.  Past trials of Effexor, Wellbutrin, Cymbalta, Xanax, Klonopin  Past Medical History:  Past Medical History:  Diagnosis Date  . Cerebrovascular accident (CVA) ([place]) 10/09/2011  . Clotting disorder ([place])   . DVT (deep venous thrombosis) ([place])   . Hearing deficit   . History of ischemic vertebrobasilar artery thalamic stroke 2001  . History of stroke   . Hyperlipidemia   . Hypertension   . Patent foramen ovale   . Right knee DJD   . Stroke [facility]) 2001    Past Surgical  History:  Procedure Laterality Date  . APPENDECTOMY    . FRACTURE SURGERY     R leg  . INCISION AND DRAINAGE Right 06/30/2012   Procedure: INCISION AND DRAINAGE;  Surgeon: Lorn Junes, MD;  Location: [place];  Service: Orthopedics;  Laterality: Right;  . KNEE ARTHROSCOPY     right knee  . KNEE BURSECTOMY Right 06/30/2012   Procedure: KNEE BURSECTOMY;  Surgeon: Lorn Junes, MD;  Location: [place];  Service: Orthopedics;  Laterality: Right;  Pre-patellar with wound closure  . TOTAL KNEE  ARTHROPLASTY  01/26/2012   Procedure: TOTAL KNEE ARTHROPLASTY;  Surgeon: Lorn Junes, MD;  Location: [place];  Service: Orthopedics;  Laterality: Right;    Family Psychiatric History: I have reviewed family psychiatric history from my progress note on 08/10/2017  Family History:  Family History  Problem Relation Age of Onset  . Cancer Father   . Seizures Brother     Social History: Reviewed social history from my progress note on 08/10/2017 Social History   Socioeconomic History  . Marital status: Married    Spouse name: Chase [name]  . Number of children: 0  . Years of education: 22  . Highest education level: Master's degree (e.g., MA, MS, MEng, MEd, MSW, MBA)  Occupational History  . Occupation: retired  . Occupation: disabled @ age 19  Tobacco Use  . Smoking status: Heavy Tobacco Smoker    Packs/day: 2.00    Years: 49.00    Pack years: 98.00    Types: Cigarettes    Last attempt to quit: 06/13/2016    Years since quitting: 3.8  . Smokeless tobacco: Never Used  . Tobacco comment: started smoking at age 66  Vaping Use  . Vaping Use: Never used  Substance and Sexual Activity  . Alcohol use: Yes    Alcohol/week: 28.0 - 42.0 standard drinks    Types: 28 - 42 Cans of beer per week    Comment: 4-6 beers a day  . Drug use: No  . Sexual activity: Not on file  Other Topics Concern  . Not on file  Social History Narrative  . Not on file   Social Determinants of Health   Financial Resource Strain: Low Risk   . Difficulty of Paying Living Expenses: Not hard at all  Food Insecurity: No Food Insecurity  . Worried About Charity fundraiser in the Last Year: Never true  . Ran Out of Food in the Last Year: Never true  Transportation Needs: No Transportation Needs  . Lack of Transportation (Medical): No  . Lack of Transportation (Non-Medical): No  Physical Activity: Inactive  . Days of Exercise per Week: 0 days  . Minutes of Exercise per Session: 0 min  Stress: No Stress Concern  Present  . Feeling of Stress : Not at all  Social Connections: Socially Isolated  . Frequency of Communication with Friends and Family: Never  . Frequency of Social Gatherings with Friends and Family: Once a week  . Attends Religious Services: Never  . Active Member of Clubs or Organizations: No  . Attends Archivist Meetings: Never  . Marital Status: Married    Allergies:  Allergies  Allergen Reactions  . Chantix [Varenicline Tartrate]     confusion    Metabolic Disorder Labs: No results found for: HGBA1C, MPG No results found for: PROLACTIN Lab Results  Component Value Date   CHOL 160 06/17/2019   TRIG 62 06/17/2019   HDL 66 06/17/2019  CHOLHDL 2.4 06/17/2019   LDLCALC 82 06/17/2019   LDLCALC 97 05/04/2018   Lab Results  Component Value Date   TSH 2.25 03/13/2017    Therapeutic Level Labs: No results found for: LITHIUM No results found for: VALPROATE No components found for:  CBMZ  Current Medications: Current Outpatient Medications  Medication Sig Dispense Refill  . acetaminophen (TYLENOL) 325 MG tablet Take 2 tablets (650 mg total) by mouth every 6 (six) hours as needed for pain. 60 tablet 0  . amLODipine-benazepril (LOTREL) 5-10 MG capsule Take 1 capsule by mouth daily. 90 capsule 3  . amoxicillin (AMOXIL) 500 MG tablet TAKE 4 TABLETS BY MOUTH 1 HOUR PRIOR TO DENTAL APPOINTMENT    . busPIRone (BUSPAR) 30 MG tablet Take 1 tablet (30 mg total) by mouth 2 (two) times daily. 180 tablet 1  . chlorhexidine (PERIDEX) 0.12 % solution     . DULoxetine (CYMBALTA) 60 MG capsule Take 1 capsule (60 mg total) by mouth daily. 90 capsule 1  . fluticasone (FLONASE) 50 MCG/ACT nasal spray Place 1 spray into both nostrils as needed.     . hydrOXYzine (VISTARIL) 25 MG capsule Take 1 capsule (25 mg total) by mouth daily as needed for anxiety (for sever panic sx). 90 capsule 1  . montelukast (SINGULAIR) 10 MG tablet TAKE 1 TABLET DAILY FOR COUGH, NASAL DRAINAGE AND  ALLERGIES 90 tablet 2  . traMADol (ULTRAM) 50 MG tablet     . warfarin (COUMADIN) 5 MG tablet TAKE 1 OR 2 TABLETS (5 OR 10MG ) DAILY AS DIRECTED BY PHYSICIAN 180 tablet 3   No current facility-administered medications for this visit.     Musculoskeletal: Strength & Muscle Tone: UTA Gait & Station: UTA Patient leans: N/A  Psychiatric Specialty Exam: Review of Systems  Psychiatric/Behavioral: Negative for agitation, behavioral problems, confusion, decreased concentration, dysphoric mood, hallucinations, self-injury, sleep disturbance and suicidal ideas. The patient is not nervous/anxious and is not hyperactive.   All other systems reviewed and are negative.   There were no vitals taken for this visit.There is no height or weight on file to calculate BMI.  General Appearance: Casual  Eye Contact:  Fair  Speech:  Clear and Coherent  Volume:  Normal  Mood:  Euthymic  Affect:  Congruent  Thought Process:  Goal Directed and Descriptions of Associations: Intact  Orientation:  Full (Time, Place, and Person)  Thought Content: Logical   Suicidal Thoughts:  No  Homicidal Thoughts:  No  Memory:  Immediate;   Fair Recent;   limited Remote;   limited  Judgement:  Fair  Insight:  Fair  Psychomotor Activity:  Normal  Concentration:  Concentration: Fair and Attention Span: Fair  Recall:  [employer] of Knowledge: Fair  Language: Fair  Akathisia:  No  Handed:  Right  AIMS (if indicated): UTA  Assets:  Communication Skills Desire for Improvement Housing Intimacy Social Support Talents/Skills  ADL's:  Intact  Cognition: WNL  Sleep:  Fair   Screenings: AUDIT   Flowsheet Row Clinical Support from 04/25/2019 in [facility]  Alcohol Use Disorder Identification Test Final Score (AUDIT) 10    GAD-7   Flowsheet Row Office Visit from 02/23/2019 in [facility]  Total GAD-7 Score 4    PHQ2-9   Flowsheet Row Video Visit from 04/12/2020 in  [place] from 04/25/2019 in [facility] Visit from 02/23/2019 in [facility] Office Visit from 05/04/2018 in [facility]  Clinical Support from 04/19/2018 in [facility]  PHQ-2 Total Score 0 0 0 2 4  PHQ-9 Total Score 0 -- -- 3 5       Assessment and Plan: SAVIR BLANKE is a 69 year old Caucasian male who has a history of depression, panic attacks, cognitive impairment, alcohol use disorder, tobacco use disorder was evaluated by telemedicine today.  Patient with history of left-sided thalamic stroke, cognitive impairment currently stable on current medication regimen.  Patient with comorbid substance abuse problems-alcoholism.  Patient however is currently doing well, plan as noted below.  Plan Depressive disorder due to medical condition-left-sided stroke in remission PHQ 9 equals 0 Cymbalta 60 mg p.o. daily BuSpar 30 mg p.o. twice daily  Panic attacks-stable BuSpar 30 mg p.o. twice daily Cymbalta as prescribed Hydroxyzine 25 mg p.o. daily as needed for severe anxiety attacks only  Alcohol use disorder-unstable Patient continues to use 4, 12 ounce beer per day and is not willing to quit Provided counseling  Tobacco use disorder-unstable Patient is not willing to cut back or quit  Mild cognitive disorder-chronic Patient continues to have good support system from his wife  Collateral information is obtained from wife-Chase [name].  Patient as well as wife agrees to be willing to be tapered off of his antidepressants to monotherapy.  Will consider doing this at his next appointment if he continues to do well.   Follow-up in clinic in 2 to 3 months or sooner if needed.  I have spent atleast 20 minutes face to face by video with patient today. More than 50 % of the time was spent for preparing to see the patient ( e.g., review of test, records ),  ordering  medications and test ,psychoeducation and supportive psychotherapy and care coordination,as well as documenting clinical information in electronic health record. This note was generated in part or whole with voice recognition software. Voice recognition is usually quite accurate but there are transcription errors that can and very often do occur. I apologize for any typographical errors that were not detected and corrected.     Ursula Alert, MD 04/13/2020, 8:09 AM

## 2020-04-16 ENCOUNTER — Other Ambulatory Visit: Payer: Self-pay | Admitting: Family Medicine

## 2020-04-16 NOTE — Telephone Encounter (Signed)
Requested medication (s) are due for refill today: yes  Requested medication (s) are on the active medication list: yes  Last refill:  06/17/19 #90 3 refills   Future visit scheduled: yes with [facility] nurse 05/09/20 only   Notes to clinic: no valid encounter within 6 months. Overdue 4 months. Do you want courtesy refill?      Requested Prescriptions  Pending Prescriptions Disp Refills   amLODipine-benazepril (LOTREL) 5-10 MG capsule [Pharmacy Med Name: AMLODIPINE BESYLATE/BENAZEPRIL HYDROCHLORIDE 5-10 MG Capsule] 90 capsule 3    Sig: TAKE 1 CAPSULE EVERY DAY      Cardiovascular: CCB + ACEI Combos Failed - 04/16/2020  5:01 PM      Failed - Cr in normal range and within 180 days    Creat  Date Value Ref Range Status  03/13/2017 1.08 0.70 - 1.25 mg/dL Final    Comment:    For patients >75 years of age, the reference limit for Creatinine is approximately 13% higher for people identified as African-American. .    Creatinine, Ser  Date Value Ref Range Status  06/17/2019 1.13 0.76 - 1.27 mg/dL Final          Failed - K in normal range and within 180 days    Potassium  Date Value Ref Range Status  06/17/2019 4.1 3.5 - 5.2 mmol/L Final          Failed - Valid encounter within last 6 months    Recent Outpatient Visits           10 months ago Essential hypertension   [facility] Birdie Sons, MD   1 year ago Essential hypertension   [facility] Birdie Sons, MD   2 years ago Cough   [facility] Birdie Sons, MD   2 years ago Cough with fever   [facility] Birdie Sons, MD   2 years ago Current moderate episode of major depressive disorder without prior episode [facility])   [facility] Birdie Sons, MD                Passed - Patient is not pregnant      Passed - Last BP in normal range    BP Readings from Last 1 Encounters:  06/17/19 98/60

## 2020-04-18 ENCOUNTER — Other Ambulatory Visit: Payer: Self-pay | Admitting: Family Medicine

## 2020-04-18 NOTE — Telephone Encounter (Signed)
Requested medication (s) are due for refill today:   Provider to determine  Requested medication (s) are on the active medication list:   Yes  Future visit scheduled:   No   Last ordered: 06/26/2019 #180, 3 refills  Non delegated refill   Requested Prescriptions  Pending Prescriptions Disp Refills   warfarin (COUMADIN) 5 MG tablet [Pharmacy Med Name: WARFARIN SODIUM 5 MG Tablet] 180 tablet 3    Sig: TAKE 1 OR 2 TABLETS (5 OR 10MG ) DAILY AS DIRECTED BY PHYSICIAN      Hematology:  Anticoagulants - warfarin Failed - 04/18/2020  5:18 AM      Failed - This refill cannot be delegated      Failed - If the patient is managed by Coumadin Clinic - route to their Pool. If not, forward to the provider.      Failed - Valid encounter within last 3 months    Recent Outpatient Visits           10 months ago Essential hypertension   [facility] Birdie Sons, MD   1 year ago Essential hypertension   [facility] Birdie Sons, MD   2 years ago Cough   [facility] Birdie Sons, MD   2 years ago Cough with fever   [facility] Birdie Sons, MD   2 years ago Current moderate episode of major depressive disorder without prior episode [facility])   [facility] Birdie Sons, MD                Passed - INR in normal range and within 30 days    INR  Date Value Ref Range Status  04/11/2020 2.5 2.0 - 3.0 Final  03/11/2017 2.3 (H)  Final    Comment:    Reference Range                     0.9-1.1 Moderate-intensity Warfarin Therapy 2.0-3.0 Higher-intensity Warfarin Therapy   3.0-4.0  .

## 2020-04-18 NOTE — Telephone Encounter (Signed)
Patient overdue for follow up appointment. I called and advised patient's wife [name]. Appointment scheduled for 05/10/2019. [name] says that patient doesn't need a refill on Coumadin right now.

## 2020-04-26 ENCOUNTER — Telehealth: Payer: Self-pay | Admitting: Family Medicine

## 2020-04-26 NOTE — Telephone Encounter (Signed)
Copied from CRM [id number]. Topic: Medicare AWV >> Apr 26, 2020  9:29 AM Claudette Laws R wrote: Reason for CRM:   Left message for patient to call back and schedule Medicare Annual Wellness Visit (AWV) in office.   If not able to come in office, please offer to do virtually or phone.  Last AWV 04/25/2019  Please schedule at anytime with [facility] Advisor.  If any questions, please contact me at [phone]

## 2020-05-09 ENCOUNTER — Other Ambulatory Visit: Payer: Self-pay

## 2020-05-09 ENCOUNTER — Ambulatory Visit (INDEPENDENT_AMBULATORY_CARE_PROVIDER_SITE_OTHER): Payer: Medicare Other | Admitting: Family Medicine

## 2020-05-09 ENCOUNTER — Ambulatory Visit: Payer: Self-pay

## 2020-05-09 ENCOUNTER — Encounter: Payer: Self-pay | Admitting: Family Medicine

## 2020-05-09 VITALS — BP 157/98 | HR 64 | Resp 16 | Wt 220.0 lb

## 2020-05-09 DIAGNOSIS — I10 Essential (primary) hypertension: Secondary | ICD-10-CM

## 2020-05-09 DIAGNOSIS — Z86718 Personal history of other venous thrombosis and embolism: Secondary | ICD-10-CM | POA: Diagnosis not present

## 2020-05-09 LAB — POCT INR
INR: 2.2 (ref 2.0–3.0)
PT: 26.6

## 2020-05-09 MED ORDER — HYDROCHLOROTHIAZIDE 12.5 MG PO TABS: 12.5000 mg | ORAL_TABLET | Freq: Every day | ORAL | 1 refills | Status: DC

## 2020-05-09 NOTE — Patient Instructions (Addendum)
Description   Dx: History of DVT Current Coumadin dose:  5mg  daily, except 10mg  on M,W, F INR: 2.2 PT: 26.6 Today's Changes: No Change Recheck: 4 weeks     Start taking 12.5mg  hctz every day in addition to your current blood pressure medication

## 2020-05-09 NOTE — Progress Notes (Signed)
Established patient visit   [name]   DOB: 26-Nov-1950   70 y.o. Male  MRN: [id number] Visit Date: 05/09/2020  Today's healthcare provider: Lelon Huh, MD   Chief Complaint  Patient presents with  . Hypertension  . Depression   Subjective    HPI  Hypertension, follow-up  BP Readings from Last 3 Encounters:  05/09/20 (!) 157/98  06/17/19 98/60  05/04/18 132/76   Wt Readings from Last 3 Encounters:  05/09/20 220 lb (99.8 kg)  06/17/19 220 lb 12.8 oz (100.2 kg)  05/04/18 215 lb (97.5 kg)     He was last seen for hypertension 11 months ago.  BP at that visit was 98/60. Management since that visit includes reducing Amlodipine- benazepril from 5-20mg  to 5-10mg  daily.  He reports good compliance with treatment. He is not having side effects.  He is following a Regular diet. He is not exercising. He does smoke.  Use of agents associated with hypertension: none.   Outside blood pressures are not checked. Symptoms: No chest pain No chest pressure  No palpitations No syncope  No dyspnea No orthopnea  No paroxysmal nocturnal dyspnea No lower extremity edema   Pertinent labs: Lab Results  Component Value Date   CHOL 160 06/17/2019   HDL 66 06/17/2019   LDLCALC 82 06/17/2019   TRIG 62 06/17/2019   CHOLHDL 2.4 06/17/2019   Lab Results  Component Value Date   NA 139 06/17/2019   K 4.1 06/17/2019   CREATININE 1.13 06/17/2019   GFRNONAA 66 06/17/2019   GFRAA 77 06/17/2019   GLUCOSE 57 (L) 06/17/2019     The 10-year ASCVD risk score Mikey Bussing DC Jr., et al., 2013) is: 26.2%   ---------------------------------------------------------------------------------------------------  Depression, Follow-up  He  was last seen for this 11 months ago. Changes made at last visit include none. It was noted during that visit he was doing well on current psychotropic medications.   He reports good compliance with treatment. He is not having side effects.    He reports good tolerance of treatment. Current symptoms include: depressed mood He feels he is Unchanged since last visit.  Depression screen [facility] 2/9 05/09/2020 04/25/2019 04/25/2019  Decreased Interest 1 0 0  Down, Depressed, Hopeless 0 0 0  PHQ - 2 Score 1 0 0  Altered sleeping 0 - -  Tired, decreased energy 0 - -  Change in appetite 0 - -  Feeling bad or failure about yourself  0 - -  Trouble concentrating 0 - -  Moving slowly or fidgety/restless 0 - -  Suicidal thoughts 0 - -  PHQ-9 Score 1 - -  Difficult doing work/chores Not difficult at all - -  Some encounter information is confidential and restricted. Go to Review Flowsheets activity to see all data.    -----------------------------------------------------------------------------------------     Medications: Outpatient Medications Prior to Visit  Medication Sig  . acetaminophen (TYLENOL) 325 MG tablet Take 2 tablets (650 mg total) by mouth every 6 (six) hours as needed for pain.  Marland Kitchen amLODipine-benazepril (LOTREL) 5-10 MG capsule TAKE 1 CAPSULE EVERY DAY  . amoxicillin (AMOXIL) 500 MG tablet TAKE 4 TABLETS BY MOUTH 1 HOUR PRIOR TO DENTAL APPOINTMENT  . busPIRone (BUSPAR) 30 MG tablet Take 1 tablet (30 mg total) by mouth 2 (two) times daily.  . chlorhexidine (PERIDEX) 0.12 % solution   . DULoxetine (CYMBALTA) 60 MG capsule Take 1 capsule (60 mg total) by mouth daily.  . fluticasone (  FLONASE) 50 MCG/ACT nasal spray Place 1 spray into both nostrils as needed.   . hydrOXYzine (VISTARIL) 25 MG capsule Take 1 capsule (25 mg total) by mouth daily as needed for anxiety (for sever panic sx).  . montelukast (SINGULAIR) 10 MG tablet TAKE 1 TABLET DAILY FOR COUGH, NASAL DRAINAGE AND ALLERGIES  . traMADol (ULTRAM) 50 MG tablet   . warfarin (COUMADIN) 5 MG tablet TAKE 1 OR 2 TABLETS (5 OR 10MG ) DAILY AS DIRECTED BY PHYSICIAN   No facility-administered medications prior to visit.    Review of Systems  Constitutional: Negative  for appetite change, chills and fever.  Respiratory: Negative for chest tightness, shortness of breath and wheezing.   Cardiovascular: Negative for chest pain and palpitations.  Gastrointestinal: Negative for abdominal pain, nausea and vomiting.      Objective    BP (!) 157/98 (BP Location: Right Arm, Patient Position: Sitting, Cuff Size: Large)   Pulse 64   Resp 16   Wt 220 lb (99.8 kg)   BMI 30.68 kg/m    Physical Exam   General: Appearance:    Obese male in no acute distress  Eyes:    PERRL, conjunctiva/corneas clear, EOM's intact       Lungs:     Clear to auscultation bilaterally, respirations unlabored  Heart:    Normal heart rate. Normal rhythm. No murmurs, rubs, or gallops.   MS:   All extremities are intact.   Neurologic:   Awake, alert, oriented x 3. No apparent focal neurological           defect.        Results for orders placed or performed in visit on 05/09/20  POCT INR  Result Value Ref Range   INR 2.2 2.0 - 3.0   PT 26.6     Assessment & Plan     1. Essential hypertension Uncontrolled, add - hydrochlorothiazide (HYDRODIURIL) 12.5 MG tablet; Take 1 tablet (12.5 mg total) by mouth daily.  Dispense: 30 tablet; Refill: 1  Follow up for BP check and PT/INR in 1 month.   2. History of DVT in adulthood Continue current anticoagulation      The entirety of the information documented in the History of Present Illness, Review of Systems and Physical Exam were personally obtained by me. Portions of this information were initially documented by the CMA and reviewed by me for thoroughness and accuracy.      Lelon Huh, MD  [facility] [phone] (phone) [phone] (fax)  [place]

## 2020-05-31 ENCOUNTER — Other Ambulatory Visit: Payer: Self-pay | Admitting: Family Medicine

## 2020-05-31 DIAGNOSIS — I10 Essential (primary) hypertension: Secondary | ICD-10-CM

## 2020-06-06 ENCOUNTER — Other Ambulatory Visit: Payer: Self-pay

## 2020-06-06 ENCOUNTER — Ambulatory Visit (INDEPENDENT_AMBULATORY_CARE_PROVIDER_SITE_OTHER): Payer: Medicare Other | Admitting: Family Medicine

## 2020-06-06 ENCOUNTER — Ambulatory Visit: Payer: Medicare Other

## 2020-06-06 VITALS — BP 148/90 | HR 62 | Temp 95.6°F | Ht 70.0 in | Wt 219.0 lb

## 2020-06-06 DIAGNOSIS — I10 Essential (primary) hypertension: Secondary | ICD-10-CM | POA: Diagnosis not present

## 2020-06-06 DIAGNOSIS — Z86718 Personal history of other venous thrombosis and embolism: Secondary | ICD-10-CM | POA: Diagnosis not present

## 2020-06-06 LAB — POCT INR
INR: 2.7 (ref 2.0–3.0)
PT: 32.4

## 2020-06-06 MED ORDER — HYDROCHLOROTHIAZIDE 25 MG PO TABS
25.0000 mg | ORAL_TABLET | Freq: Every day | ORAL | 1 refills | Status: DC
Start: 2020-06-06 — End: 2020-07-04

## 2020-06-06 NOTE — Progress Notes (Signed)
Established patient visit   [name]   DOB: April 11, 1951   70 y.o. Male  MRN: [id number] Visit Date: 06/06/2020    Today's healthcare provider: Lelon Huh, MD   Chief Complaint  Patient presents with  . Hypertension   Subjective    HPI HPI    Hypertension    This is a chronic problem.  Recent episode started more than a year ago.  The problem is uncontrolled.  Compliance with treatment is excellent.  Anxiety: Present.  Blurred vision: Absent.  Chest pain: Absent.  Chest pressure/discomfort: Absent.  Dyspnea: Absent.  Headaches: Absent.  Lower extremity edema: Present.  Orthopnea: Absent.  Palpitations: Absent.  Paroxysmal nocturnal dyspnea: Absent.  Syncope: Absent.  The patient exercises daily.  Patient's diet is generally healthy.       Last edited by Sylvester Harder, [place] on 06/06/2020  8:58 AM. (History)      Hypertension, follow-up  BP Readings from Last 3 Encounters:  06/06/20 (!) 148/90  05/09/20 (!) 157/98  06/17/19 98/60   Wt Readings from Last 3 Encounters:  05/09/20 220 lb (99.8 kg)  06/17/19 220 lb 12.8 oz (100.2 kg)  05/04/18 215 lb (97.5 kg)     He was last seen for hypertension 1 months ago.  BP at that visit was 157/98. Management since that visit includes adding hydrochlorothiazide (HYDRODIURIL) 12.5 MG tablet; Take 1 tablet (12.5 mg total) by mouth daily.  He reports excellent compliance with treatment. He is not having side effects.  He is following a Regular diet. He is exercising. He does smoke.  Use of agents associated with hypertension: NSAIDS.   Outside blood pressures are N/A. Symptoms: No chest pain No chest pressure  No palpitations No syncope  No dyspnea No orthopnea  No paroxysmal nocturnal dyspnea Yes lower extremity edema - occassionally   Pertinent labs: Lab Results  Component Value Date   CHOL 160 06/17/2019   HDL 66 06/17/2019   LDLCALC 82 06/17/2019   TRIG 62 06/17/2019   CHOLHDL 2.4 06/17/2019    Lab Results  Component Value Date   NA 139 06/17/2019   K 4.1 06/17/2019   CREATININE 1.13 06/17/2019   GFRNONAA 66 06/17/2019   GFRAA 77 06/17/2019   GLUCOSE 57 (L) 06/17/2019     The 10-year ASCVD risk score Mikey Bussing DC Jr., et al., 2013) is: 26.2%   ---------------------------------------------------------------------------------------------------      Medications: Outpatient Medications Prior to Visit  Medication Sig  . acetaminophen (TYLENOL) 325 MG tablet Take 2 tablets (650 mg total) by mouth every 6 (six) hours as needed for pain.  Marland Kitchen amLODipine-benazepril (LOTREL) 5-10 MG capsule TAKE 1 CAPSULE EVERY DAY  . busPIRone (BUSPAR) 30 MG tablet Take 1 tablet (30 mg total) by mouth 2 (two) times daily.  . chlorhexidine (PERIDEX) 0.12 % solution   . DULoxetine (CYMBALTA) 60 MG capsule Take 1 capsule (60 mg total) by mouth daily.  . fluticasone (FLONASE) 50 MCG/ACT nasal spray Place 1 spray into both nostrils as needed.   . hydrochlorothiazide (HYDRODIURIL) 12.5 MG tablet Take 1 tablet (12.5 mg total) by mouth daily.  . hydrOXYzine (VISTARIL) 25 MG capsule Take 1 capsule (25 mg total) by mouth daily as needed for anxiety (for sever panic sx).  . montelukast (SINGULAIR) 10 MG tablet TAKE 1 TABLET DAILY FOR COUGH, NASAL DRAINAGE AND ALLERGIES  . traMADol (ULTRAM) 50 MG tablet   . warfarin (COUMADIN) 5 MG tablet TAKE 1 OR 2 TABLETS (  5 OR 10MG ) DAILY AS DIRECTED BY PHYSICIAN  . amoxicillin (AMOXIL) 500 MG tablet TAKE 4 TABLETS BY MOUTH 1 HOUR PRIOR TO DENTAL APPOINTMENT (Patient not taking: Reported on 06/06/2020)   No facility-administered medications prior to visit.    Review of Systems     Objective    BP (!) 148/90   Pulse 62   Temp (!) 95.6 F (35.3 C) (Temporal)   Ht 5\' 10"  (1.778 m)   Wt 219 lb (99.3 kg)   BMI 31.42 kg/m     Physical Exam    General: Appearance:    Well developed, well nourished male in no acute distress  Eyes:    PERRL, conjunctiva/corneas clear,  EOM's intact       Lungs:     Clear to auscultation bilaterally, respirations unlabored  Heart:    Normal heart rate. Normal rhythm. No murmurs, rubs, or gallops.   MS:   All extremities are intact.   Neurologic:   Awake, alert, oriented x 3. No apparent focal neurological           defect.         Results for orders placed or performed in visit on 06/06/20  POCT INR  Result Value Ref Range   INR 2.7 2.0 - 3.0   PT 32.4     Assessment & Plan     1. Essential hypertension Improved with initiation of hctz, but not to goal. Increase from 12.5 to - hydrochlorothiazide (HYDRODIURIL) 25 MG tablet; Take 1 tablet (25 mg total) by mouth daily.  Dispense: 30 tablet; Refill: 1  2. History of DVT in adulthood Therapeutic PT/INR.   Future Appointments  Date Time Provider [place]  06/19/2020  8:20 AM [facility] HEALTH ADVISOR [facility]  07/04/2020  8:20 AM Birdie Sons, MD [facility]  07/23/2020  4:00 PM Ursula Alert, MD [facility] None         The entirety of the information documented in the History of Present Illness, Review of Systems and Physical Exam were personally obtained by me. Portions of this information were initially documented by the CMA and reviewed by me for thoroughness and accuracy.      Lelon Huh, MD  [facility] [phone] (phone) [phone] (fax)  [place]

## 2020-06-06 NOTE — Patient Instructions (Signed)
Description   Dx: History of DVT Current Coumadin dose:  5mg  daily, except 10mg  on M,W, F INR: 2.7 PT: 32.4 Today's Changes: No Change Recheck: 4 weeks

## 2020-06-18 NOTE — Progress Notes (Unsigned)
Subjective:   Chase [name] is a 70 y.o. male who presents for Medicare Annual/Subsequent preventive examination.  I connected with Chase [name] today by telephone and verified that I am speaking with the correct person using two identifiers. Location patient: home Location provider: work Persons participating in the virtual visit: patient, provider.   I discussed the limitations, risks, security and privacy concerns of performing an evaluation and management service by telephone and the availability of in person appointments. I also discussed with the patient that there may be a patient responsible charge related to this service. The patient expressed understanding and verbally consented to this telephonic visit.    Interactive audio and video telecommunications were attempted between this provider and patient, however failed, due to patient having technical difficulties OR patient did not have access to video capability.  We continued and completed visit with audio only.   Review of Systems    N/A  Cardiac Risk Factors include: advanced age (>66mn, >>74women);hypertension;male gender;smoking/ tobacco exposure;sedentary lifestyle     Objective:    Today's Vitals   06/19/20 03329 PainSc: 2    There is no height or weight on file to calculate BMI.  Advanced Directives 06/19/2020 04/25/2019 04/19/2018 04/10/2017 06/30/2012 06/29/2012 01/19/2012  Does Patient Have a Medical Advance Directive? Yes Yes Yes Yes Patient has advance directive, copy not in chart Patient has advance directive, copy not in chart Patient has advance directive, copy not in chart  Type of Advance Directive [place] - Living will Living will Living will;Healthcare Power of AB[place]ain Chart? Yes - validated most recent copy scanned in chart (See row information)  Yes - validated most recent copy scanned in chart (See row information) Yes - validated most recent copy scanned in chart (See row information) - Copy requested from family Copy requested from family -  Pre-existing out of facility DNR order (yellow form or pink MOST form) - - - - No No No  Some encounter information is confidential and restricted. Go to Review Flowsheets activity to see all data.    Current Medications (verified) Outpatient Encounter Medications as of 06/19/2020  Medication Sig  . acetaminophen (TYLENOL) 325 MG tablet Take 2 tablets (650 mg total) by mouth every 6 (six) hours as needed for pain.  .Marland KitchenamLODipine-benazepril (LOTREL) 5-10 MG capsule TAKE 1 CAPSULE EVERY DAY  . amoxicillin (AMOXIL) 500 MG tablet Take by mouth as directed. 1 hour prior to dental procedures  . busPIRone (BUSPAR) 30 MG tablet Take 1 tablet (30 mg total) by mouth 2 (two) times daily.  . DULoxetine (CYMBALTA) 60 MG capsule Take 1 capsule (60 mg total) by mouth daily.  . fluticasone (FLONASE) 50 MCG/ACT nasal spray Place 1 spray into both nostrils as needed.   . hydrochlorothiazide (HYDRODIURIL) 25 MG tablet Take 1 tablet (25 mg total) by mouth daily.  . hydrOXYzine (VISTARIL) 25 MG capsule Take 1 capsule (25 mg total) by mouth daily as needed for anxiety (for sever panic sx).  . montelukast (SINGULAIR) 10 MG tablet TAKE 1 TABLET DAILY FOR COUGH, NASAL DRAINAGE AND ALLERGIES  . warfarin (COUMADIN) 5 MG tablet TAKE 1 OR 2 TABLETS (5 OR 10MG) DAILY AS DIRECTED BY PHYSICIAN  . chlorhexidine (PERIDEX) 0.12 % solution  (Patient not taking: Reported on 06/19/2020)  . traMADol (ULTRAM) 50 MG tablet  (Patient not taking: Reported on 06/19/2020)  No facility-administered encounter medications on file as of 06/19/2020.    Allergies (verified) Chantix [varenicline tartrate]   History: Past Medical History:  Diagnosis Date  . Cerebrovascular accident (CVA) ([place]) 10/09/2011  . Clotting disorder ([place])   . DVT  (deep venous thrombosis) ([place])   . Hearing deficit   . History of ischemic vertebrobasilar artery thalamic stroke 2001  . History of stroke   . Hyperlipidemia   . Hypertension   . Patent foramen ovale   . Right knee DJD   . Stroke [facility]) 2001   Past Surgical History:  Procedure Laterality Date  . APPENDECTOMY    . FRACTURE SURGERY     R leg  . INCISION AND DRAINAGE Right 06/30/2012   Procedure: INCISION AND DRAINAGE;  Surgeon: Lorn Junes, MD;  Location: [place];  Service: Orthopedics;  Laterality: Right;  . KNEE ARTHROSCOPY     right knee  . KNEE BURSECTOMY Right 06/30/2012   Procedure: KNEE BURSECTOMY;  Surgeon: Lorn Junes, MD;  Location: [place];  Service: Orthopedics;  Laterality: Right;  Pre-patellar with wound closure  . TOTAL KNEE ARTHROPLASTY  01/26/2012   Procedure: TOTAL KNEE ARTHROPLASTY;  Surgeon: Lorn Junes, MD;  Location: [place];  Service: Orthopedics;  Laterality: Right;   Family History  Problem Relation Age of Onset  . Cancer Father   . Seizures Brother    Social History   Socioeconomic History  . Marital status: Married    Spouse name: debra  . Number of children: 0  . Years of education: 56  . Highest education level: Master's degree (e.g., MA, MS, MEng, MEd, MSW, MBA)  Occupational History  . Occupation: retired  . Occupation: disabled @ age 83  Tobacco Use  . Smoking status: Heavy Tobacco Smoker    Packs/day: 2.00    Years: 49.00    Pack years: 98.00    Types: Cigarettes    Last attempt to quit: 06/13/2016    Years since quitting: 4.0  . Smokeless tobacco: Never Used  . Tobacco comment: started smoking at age 68  Vaping Use  . Vaping Use: Never used  Substance and Sexual Activity  . Alcohol use: Yes    Alcohol/week: 28.0 - 42.0 standard drinks    Types: 28 - 42 Cans of beer per week    Comment: 4-6 beers a day  . Drug use: No  . Sexual activity: Not on file  Other Topics Concern  . Not on file  Social History Narrative  . Not on  file   Social Determinants of Health   Financial Resource Strain: Low Risk   . Difficulty of Paying Living Expenses: Not hard at all  Food Insecurity: No Food Insecurity  . Worried About Charity fundraiser in the Last Year: Never true  . Ran Out of Food in the Last Year: Never true  Transportation Needs: No Transportation Needs  . Lack of Transportation (Medical): No  . Lack of Transportation (Non-Medical): No  Physical Activity: Inactive  . Days of Exercise per Week: 0 days  . Minutes of Exercise per Session: 0 min  Stress: No Stress Concern Present  . Feeling of Stress : Not at all  Social Connections: Moderately Isolated  . Frequency of Communication with Friends and Family: Once a week  . Frequency of Social Gatherings with Friends and Family: More than three times a week  . Attends Religious Services: Never  . Active Member of Clubs or Organizations: No  .  Attends Archivist Meetings: Never  . Marital Status: Married    Tobacco Counseling Ready to quit: No Counseling given: No Comment: started smoking at age 46   Clinical Intake:  Pre-visit preparation completed: Yes  Pain : 0-10 Pain Score: 2  Pain Type: Chronic pain Pain Location: Back Pain Orientation: Lower Pain Descriptors / Indicators: Aching Pain Frequency: Constant Pain Relieving Factors: Takes Ibuprofen daily for pain.  Pain Relieving Factors: Takes Ibuprofen daily for pain.  Nutritional Risks: None Diabetes: No  How often do you need to have someone help you when you read instructions, pamphlets, or other written materials from your doctor or pharmacy?: 1 - Never  Diabetic?No  Interpreter Needed?: No  Information entered by :: [facility], LPN   Activities of Daily Living In your present state of health, do you have any difficulty performing the following activities: 06/19/2020 05/09/2020  Hearing? N N  Vision? N N  Difficulty concentrating or making decisions? Y N  Comment Due to  short term memory loss from previous stroke. -  Walking or climbing stairs? N N  Dressing or bathing? N N  Doing errands, shopping? N N  Preparing Food and eating ? N -  Using the Toilet? N -  In the past six months, have you accidently leaked urine? N -  Do you have problems with loss of bowel control? N -  Managing your Medications? N -  Managing your Finances? N -  Housekeeping or managing your Housekeeping? N -  Some recent data might be hidden    Patient Care Team: Birdie Sons, MD as PCP - General (Family Medicine) Ursula Alert, MD as Consulting Physician (Psychiatry) [employer] [facility])  Indicate any recent Medical Services you may have received from other than [facility] providers in the past year (date may be approximate).     Assessment:   This is a routine wellness examination for Dawit.  Hearing/Vision screen No exam data present  Dietary issues and exercise activities discussed: Current Exercise Habits: The patient does not participate in regular exercise at present, Exercise limited by: None identified  Goals    . Quit Smoking     Recommend to continue efforts to reduce smoking habits until no longer smoking.      . Reduce alcohol intake     Recommend to cut back on alcohol in take by half.      Depression Screen PHQ 2/9 Scores 06/19/2020 05/09/2020 04/25/2019 04/25/2019 05/04/2018 04/19/2018 04/10/2017  PHQ - 2 Score 0 1 0 0 '2 4 6  ' PHQ- 9 Score - 1 - - '3 5 15  ' Some encounter information is confidential and restricted. Go to Review Flowsheets activity to see all data.    Fall Risk Fall Risk  06/19/2020 05/09/2020 04/25/2019 04/19/2018 04/10/2017  Falls in the past year? 0 0 0 0 No  Number falls in past yr: 0 0 0 0 -  Injury with Fall? 0 0 0 0 -  Follow up - Falls evaluation completed - - -    FALL RISK PREVENTION PERTAINING TO THE HOME:  Any stairs in or around the home? Yes  If so, are there any without handrails? No Home free of loose  throw rugs in walkways, pet beds, electrical cords, etc? Yes  Adequate lighting in your home to reduce risk of falls? Yes   ASSISTIVE DEVICES UTILIZED TO PREVENT FALLS:  Life alert? No  Use of a cane, walker or w/c? No  Grab bars in the  bathroom? Yes  Shower chair or bench in shower? No  Elevated toilet seat or a handicapped toilet? Yes    Cognitive Function: Declined screening today.      6CIT Screen 04/10/2017  What Year? 4 points  What month? 0 points  What time? 0 points  Count back from 20 0 points  Months in reverse 0 points  Repeat phrase 10 points  Total Score 14    Immunizations Immunization History  Administered Date(s) Administered  . Fluad Quad(high Dose 65+) 04/11/2020  . Influenza Split 01/27/2012  . Influenza, High Dose Seasonal PF 12/18/2016, 01/15/2018  . Influenza, Quadrivalent, Recombinant, Inj, Pf 01/31/2019  . Influenza,inj,Quad PF,6+ Mos 04/06/2015  . PFIZER(Purple Top)SARS-COV-2 Vaccination 05/17/2019, 06/07/2019, 02/01/2020  . Pneumococcal Conjugate-13 12/18/2016  . Pneumococcal Polysaccharide-23 01/27/2012, 05/04/2018  . Td 11/23/2015  . Tdap 11/10/2005    TDAP status: Up to date  Flu Vaccine status: Up to date  Pneumococcal vaccine status: Up to date  Covid-19 vaccine status: Completed vaccines  Qualifies for Shingles Vaccine? Yes   Zostavax completed No   Shingrix Completed?: No.    Education has been provided regarding the importance of this vaccine. Patient has been advised to call insurance company to determine out of pocket expense if they have not yet received this vaccine. Advised may also receive vaccine at local pharmacy or Health Dept. Verbalized acceptance and understanding.  Screening Tests Health Maintenance  Topic Date Due  . COLONOSCOPY (Pts 45-28yr Insurance coverage will need to be confirmed)  Never done  . TETANUS/TDAP  11/22/2025  . INFLUENZA VACCINE  Completed  . COVID-19 Vaccine  Completed  . Hepatitis C  Screening  Completed  . PNA vac Low Risk Adult  Completed    Health Maintenance  Health Maintenance Due  Topic Date Due  . COLONOSCOPY (Pts 45-451yrInsurance coverage will need to be confirmed)  Never done    Colorectal cancer screening: Cologuard currently due. Pt has kit at home and is going to check the expiration date and will CB if needs a new order. Pt plans to complete kit if not expired.   Lung Cancer Screening: (Low Dose CT Chest recommended if Age 70-80ears, 30 pack-year currently smoking OR have quit w/in 15years.) does qualify.   Lung Cancer Screening Referral: An Epic message has been sent to ShBurgess EstelleRN (Oncology Nurse Navigator) regarding the possible need for this exam. ShRaquel Sarnaill review the patient's chart to determine if the patient truly qualifies for the exam. If the patient qualifies, ShRaquel Sarnaill order the Low Dose CT of the chest to facilitate the scheduling of this exam.  Additional Screening:  Hepatitis C Screening: Up to date  Vision Screening: Recommended annual ophthalmology exams for early detection of glaucoma and other disorders of the eye. Is the patient up to date with their annual eye exam?  Yes  Who is the provider or what is the name of the office in which the patient attends annual eye exams? Dr RiMarvel Planf pt is not established with a provider, would they like to be referred to a provider to establish care? No .   Dental Screening: Recommended annual dental exams for proper oral hygiene  Community Resource Referral / Chronic Care Management: CRR required this visit?  No   CCM required this visit?  No      Plan:     I have personally reviewed and noted the following in the patient's chart:   . Medical and social history .  Use of alcohol, tobacco or illicit drugs  . Current medications and supplements . Functional ability and status . Nutritional status . Physical activity . Advanced directives . List of other  physicians . Hospitalizations, surgeries, and ER visits in previous 12 months . Vitals . Screenings to include cognitive, depression, and falls . Referrals and appointments  In addition, I have reviewed and discussed with patient certain preventive protocols, quality metrics, and best practice recommendations. A written personalized care plan for preventive services as well as general preventive health recommendations were provided to patient.     Jemmie Ledgerwood [place], [place]   05/28/4386   Nurse Notes: Cologuard currently due. Pt has a kit at home and is going to check the expiration date and will CB if needs a new order. Pt plans to complete kit if not expired.

## 2020-06-19 ENCOUNTER — Ambulatory Visit (INDEPENDENT_AMBULATORY_CARE_PROVIDER_SITE_OTHER): Payer: Medicare Other

## 2020-06-19 ENCOUNTER — Other Ambulatory Visit: Payer: Self-pay

## 2020-06-19 DIAGNOSIS — Z0000 Encounter for general adult medical examination without abnormal findings: Secondary | ICD-10-CM

## 2020-06-19 NOTE — Patient Instructions (Signed)
Chase [name] , Thank you for taking time to come for your Medicare Wellness Visit. I appreciate your ongoing commitment to your health goals. Please review the following plan we discussed and let me know if I can assist you in the future.   Screening recommendations/referrals: Colonoscopy: Cologuard currently due. Pt has kit at home and is going to check the expiration date and will CB if needs a new order. Pt plans to complete kit if not expired.  Recommended yearly ophthalmology/optometry visit for glaucoma screening and checkup Recommended yearly dental visit for hygiene and checkup  Vaccinations: Influenza vaccine: Done 04/11/20 Pneumococcal vaccine: Completed series Tdap vaccine: Up to date, due 10/2025 Shingles vaccine: Shingrix discussed. Please contact your pharmacy for coverage information.     Advanced directives: Currently on file.  Conditions/risks identified: Smoking and alcohol cessation discussed today.   Next appointment: 07/04/20 @ 8:20 AM with Dr Caryn Section   Preventive Care 69 Years and Older, Male Preventive care refers to lifestyle choices and visits with your health care provider that can promote health and wellness. What does preventive care include?  A yearly physical exam. This is also called an annual well check.  Dental exams once or twice a year.  Routine eye exams. Ask your health care provider how often you should have your eyes checked.  Personal lifestyle choices, including:  Daily care of your teeth and gums.  Regular physical activity.  Eating a healthy diet.  Avoiding tobacco and drug use.  Limiting alcohol use.  Practicing safe sex.  Taking low doses of aspirin every day.  Taking vitamin and mineral supplements as recommended by your health care provider. What happens during an annual well check? The services and screenings done by your health care provider during your annual well check will depend on your age, overall health, lifestyle  risk factors, and family history of disease. Counseling  Your health care provider may ask you questions about your:  Alcohol use.  Tobacco use.  Drug use.  Emotional well-being.  Home and relationship well-being.  Sexual activity.  Eating habits.  History of falls.  Memory and ability to understand (cognition).  Work and work Statistician. Screening  You may have the following tests or measurements:  Height, weight, and BMI.  Blood pressure.  Lipid and cholesterol levels. These may be checked every 5 years, or more frequently if you are over 36 years old.  Skin check.  Lung cancer screening. You may have this screening every year starting at age 91 if you have a 30-pack-year history of smoking and currently smoke or have quit within the past 15 years.  Fecal occult blood test (FOBT) of the stool. You may have this test every year starting at age 10.  Flexible sigmoidoscopy or colonoscopy. You may have a sigmoidoscopy every 5 years or a colonoscopy every 10 years starting at age 45.  Prostate cancer screening. Recommendations will vary depending on your family history and other risks.  Hepatitis C blood test.  Hepatitis B blood test.  Sexually transmitted disease (STD) testing.  Diabetes screening. This is done by checking your blood sugar (glucose) after you have not eaten for a while (fasting). You may have this done every 1-3 years.  Abdominal aortic aneurysm (AAA) screening. You may need this if you are a current or former smoker.  Osteoporosis. You may be screened starting at age 34 if you are at high risk. Talk with your health care provider about your test results, treatment options, and if  necessary, the need for more tests. Vaccines  Your health care provider may recommend certain vaccines, such as:  Influenza vaccine. This is recommended every year.  Tetanus, diphtheria, and acellular pertussis (Tdap, Td) vaccine. You may need a Td booster every 10  years.  Zoster vaccine. You may need this after age 20.  Pneumococcal 13-valent conjugate (PCV13) vaccine. One dose is recommended after age 62.  Pneumococcal polysaccharide (PPSV23) vaccine. One dose is recommended after age 70. Talk to your health care provider about which screenings and vaccines you need and how often you need them. This information is not intended to replace advice given to you by your health care provider. Make sure you discuss any questions you have with your health care provider. Document Released: 05/11/2015 Document Revised: 01/02/2016 Document Reviewed: 02/13/2015 Elsevier Interactive Patient Education  2017 [place] Prevention in the Home Falls can cause injuries. They can happen to people of all ages. There are many things you can do to make your home safe and to help prevent falls. What can I do on the outside of my home?  Regularly fix the edges of walkways and driveways and fix any cracks.  Remove anything that might make you trip as you walk through a door, such as a raised step or threshold.  Trim any bushes or trees on the path to your home.  Use bright outdoor lighting.  Clear any walking paths of anything that might make someone trip, such as rocks or tools.  Regularly check to see if handrails are loose or broken. Make sure that both sides of any steps have handrails.  Any raised decks and porches should have guardrails on the edges.  Have any leaves, snow, or ice cleared regularly.  Use sand or salt on walking paths during winter.  Clean up any spills in your garage right away. This includes oil or grease spills. What can I do in the bathroom?  Use night lights.  Install grab bars by the toilet and in the tub and shower. Do not use towel bars as grab bars.  Use non-skid mats or decals in the tub or shower.  If you need to sit down in the shower, use a plastic, non-slip stool.  Keep the floor dry. Clean up any water that  spills on the floor as soon as it happens.  Remove soap buildup in the tub or shower regularly.  Attach bath mats securely with double-sided non-slip rug tape.  Do not have throw rugs and other things on the floor that can make you trip. What can I do in the bedroom?  Use night lights.  Make sure that you have a light by your bed that is easy to reach.  Do not use any sheets or blankets that are too big for your bed. They should not hang down onto the floor.  Have a firm chair that has side arms. You can use this for support while you get dressed.  Do not have throw rugs and other things on the floor that can make you trip. What can I do in the kitchen?  Clean up any spills right away.  Avoid walking on wet floors.  Keep items that you use a lot in easy-to-reach places.  If you need to reach something above you, use a strong step stool that has a grab bar.  Keep electrical cords out of the way.  Do not use floor polish or wax that makes floors slippery. If you must  use wax, use non-skid floor wax.  Do not have throw rugs and other things on the floor that can make you trip. What can I do with my stairs?  Do not leave any items on the stairs.  Make sure that there are handrails on both sides of the stairs and use them. Fix handrails that are broken or loose. Make sure that handrails are as long as the stairways.  Check any carpeting to make sure that it is firmly attached to the stairs. Fix any carpet that is loose or worn.  Avoid having throw rugs at the top or bottom of the stairs. If you do have throw rugs, attach them to the floor with carpet tape.  Make sure that you have a light switch at the top of the stairs and the bottom of the stairs. If you do not have them, ask someone to add them for you. What else can I do to help prevent falls?  Wear shoes that:  Do not have high heels.  Have rubber bottoms.  Are comfortable and fit you well.  Are closed at the  toe. Do not wear sandals.  If you use a stepladder:  Make sure that it is fully opened. Do not climb a closed stepladder.  Make sure that both sides of the stepladder are locked into place.  Ask someone to hold it for you, if possible.  Clearly mark and make sure that you can see:  Any grab bars or handrails.  First and last steps.  Where the edge of each step is.  Use tools that help you move around (mobility aids) if they are needed. These include:  Canes.  Walkers.  Scooters.  Crutches.  Turn on the lights when you go into a dark area. Replace any light bulbs as soon as they burn out.  Set up your furniture so you have a clear path. Avoid moving your furniture around.  If any of your floors are uneven, fix them.  If there are any pets around you, be aware of where they are.  Review your medicines with your doctor. Some medicines can make you feel dizzy. This can increase your chance of falling. Ask your doctor what other things that you can do to help prevent falls. This information is not intended to replace advice given to you by your health care provider. Make sure you discuss any questions you have with your health care provider. Document Released: 02/08/2009 Document Revised: 09/20/2015 Document Reviewed: 05/19/2014 Elsevier Interactive Patient Education  2017 [employer].

## 2020-06-23 ENCOUNTER — Other Ambulatory Visit: Payer: Self-pay | Admitting: Family Medicine

## 2020-06-23 DIAGNOSIS — J301 Allergic rhinitis due to pollen: Secondary | ICD-10-CM

## 2020-06-26 ENCOUNTER — Other Ambulatory Visit: Payer: Self-pay | Admitting: Family Medicine

## 2020-06-26 DIAGNOSIS — I10 Essential (primary) hypertension: Secondary | ICD-10-CM

## 2020-07-02 ENCOUNTER — Other Ambulatory Visit: Payer: Self-pay | Admitting: Family Medicine

## 2020-07-02 ENCOUNTER — Telehealth: Payer: Self-pay

## 2020-07-02 DIAGNOSIS — I10 Essential (primary) hypertension: Secondary | ICD-10-CM

## 2020-07-02 MED ORDER — AMLODIPINE BESY-BENAZEPRIL HCL 5-10 MG PO CAPS: 1.0000 | ORAL_CAPSULE | Freq: Every day | ORAL | 0 refills | Status: DC

## 2020-07-02 NOTE — Telephone Encounter (Signed)
[facility] faxed refill request for the following medications:  amLODipine-benazepril (LOTREL) 5-10 MG capsule  Please advise.

## 2020-07-04 ENCOUNTER — Ambulatory Visit (INDEPENDENT_AMBULATORY_CARE_PROVIDER_SITE_OTHER): Payer: Medicare Other | Admitting: Family Medicine

## 2020-07-04 ENCOUNTER — Telehealth: Payer: Self-pay | Admitting: *Deleted

## 2020-07-04 ENCOUNTER — Other Ambulatory Visit: Payer: Self-pay

## 2020-07-04 VITALS — BP 131/78 | HR 69 | Ht 71.0 in | Wt 216.0 lb

## 2020-07-04 DIAGNOSIS — Z86718 Personal history of other venous thrombosis and embolism: Secondary | ICD-10-CM

## 2020-07-04 DIAGNOSIS — Z87891 Personal history of nicotine dependence: Secondary | ICD-10-CM

## 2020-07-04 DIAGNOSIS — Z122 Encounter for screening for malignant neoplasm of respiratory organs: Secondary | ICD-10-CM

## 2020-07-04 DIAGNOSIS — I10 Essential (primary) hypertension: Secondary | ICD-10-CM | POA: Diagnosis not present

## 2020-07-04 DIAGNOSIS — F17200 Nicotine dependence, unspecified, uncomplicated: Secondary | ICD-10-CM

## 2020-07-04 DIAGNOSIS — Q211 Atrial septal defect: Secondary | ICD-10-CM

## 2020-07-04 DIAGNOSIS — Z125 Encounter for screening for malignant neoplasm of prostate: Secondary | ICD-10-CM

## 2020-07-04 DIAGNOSIS — Q2112 Patent foramen ovale: Secondary | ICD-10-CM

## 2020-07-04 DIAGNOSIS — Z7901 Long term (current) use of anticoagulants: Secondary | ICD-10-CM

## 2020-07-04 LAB — POCT INR
INR: 2.4 (ref 2.0–3.0)
PT: 28.3

## 2020-07-04 MED ORDER — HYDROCHLOROTHIAZIDE 25 MG PO TABS: 25.0000 mg | ORAL_TABLET | Freq: Every day | ORAL | 4 refills | Status: DC

## 2020-07-04 NOTE — Patient Instructions (Signed)
Description   Dx: History of DVT Current Coumadin dose:  5mg  daily, except 10mg  on M,W, F INR: 2.4 PT: 28.3 Today's Changes: No Change Recheck: 4 weeks
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
## 2020-07-04 NOTE — Telephone Encounter (Signed)
Received referral for initial lung cancer screening scan. Contacted patient and obtained smoking history,(current, 98 pack yeara) as well as answering questions related to screening process. Patient denies signs of lung cancer such as weight loss or hemoptysis. Patient denies comorbidity that would prevent curative treatment if lung cancer were found. Patient is scheduled for shared decision making visit and CT scan on 07/17/20 at 130pm.

## 2020-07-05 LAB — CBC
Hematocrit: 42 % (ref 37.5–51.0)
Hemoglobin: 14.9 g/dL (ref 13.0–17.7)
MCH: 32.7 pg (ref 26.6–33.0)
MCHC: 35.5 g/dL (ref 31.5–35.7)
MCV: 92 fL (ref 79–97)
Platelets: 219 10*3/uL (ref 150–450)
RBC: 4.56 x10E6/uL (ref 4.14–5.80)
RDW: 13.1 % (ref 11.6–15.4)
WBC: 5.4 10*3/uL (ref 3.4–10.8)

## 2020-07-05 LAB — COMPREHENSIVE METABOLIC PANEL
ALT: 20 IU/L (ref 0–44)
AST: 25 IU/L (ref 0–40)
Albumin/Globulin Ratio: 1.9 (ref 1.2–2.2)
Albumin: 4.5 g/dL (ref 3.8–4.8)
Alkaline Phosphatase: 51 IU/L (ref 44–121)
BUN/Creatinine Ratio: 10 (ref 10–24)
BUN: 11 mg/dL (ref 8–27)
Bilirubin Total: 0.5 mg/dL (ref 0.0–1.2)
CO2: 22 mmol/L (ref 20–29)
Calcium: 9.7 mg/dL (ref 8.6–10.2)
Chloride: 96 mmol/L (ref 96–106)
Creatinine, Ser: 1.05 mg/dL (ref 0.76–1.27)
Globulin, Total: 2.4 g/dL (ref 1.5–4.5)
Glucose: 106 mg/dL — ABNORMAL HIGH (ref 65–99)
Potassium: 5.2 mmol/L (ref 3.5–5.2)
Sodium: 135 mmol/L (ref 134–144)
Total Protein: 6.9 g/dL (ref 6.0–8.5)
eGFR: 77 mL/min/{1.73_m2} (ref >59)

## 2020-07-05 LAB — LIPID PANEL
Chol/HDL Ratio: 2.7 ratio (ref 0.0–5.0)
Cholesterol, Total: 178 mg/dL (ref 100–199)
HDL: 67 mg/dL (ref >39)
LDL Chol Calc (NIH): 102 mg/dL — ABNORMAL HIGH (ref 0–99)
Triglycerides: 47 mg/dL (ref 0–149)
VLDL Cholesterol Cal: 9 mg/dL (ref 5–40)

## 2020-07-05 LAB — PSA TOTAL (REFLEX TO FREE): Prostate Specific Ag, Serum: 1.3 ng/mL (ref 0.0–4.0)

## 2020-07-17 ENCOUNTER — Inpatient Hospital Stay: Payer: Medicare Other | Attending: Nurse Practitioner | Admitting: Nurse Practitioner

## 2020-07-17 ENCOUNTER — Other Ambulatory Visit: Payer: Self-pay

## 2020-07-17 ENCOUNTER — Ambulatory Visit
Admission: RE | Admit: 2020-07-17 | Discharge: 2020-07-17 | Disposition: A | Discharge status: Home / Self Care | Payer: Medicare Other | Source: Ambulatory Visit | Attending: Nurse Practitioner | Admitting: Nurse Practitioner

## 2020-07-17 DIAGNOSIS — Z122 Encounter for screening for malignant neoplasm of respiratory organs: Secondary | ICD-10-CM

## 2020-07-17 DIAGNOSIS — Z87891 Personal history of nicotine dependence: Secondary | ICD-10-CM | POA: Diagnosis not present

## 2020-07-17 DIAGNOSIS — F17200 Nicotine dependence, unspecified, uncomplicated: Secondary | ICD-10-CM | POA: Diagnosis not present

## 2020-07-17 NOTE — Progress Notes (Signed)
Virtual Visit via Video Enabled Telemedicine Note   I connected with Chase [name] on 07/17/20 at 1:30 PM EST by video enabled telemedicine visit and verified that I am speaking with the correct person using two identifiers.   I discussed the limitations, risks, security and privacy concerns of performing an evaluation and management service by telemedicine and the availability of in-person appointments. I also discussed with the patient that there may be a patient responsible charge related to this service. The patient expressed understanding and agreed to proceed.   Other persons participating in the visit and their role in the encounter: Burgess Estelle, RN- checking in patient & navigation  Patient's location: [place]  Provider's location: Clinic  Chief Complaint: Low Dose CT Screening  Patient agreed to evaluation by telemedicine to discuss shared decision making for consideration of low dose CT lung cancer screening.    In accordance with CMS guidelines, patient has met eligibility criteria including age, absence of signs or symptoms of lung cancer.  Social History   Tobacco Use  . Smoking status: Heavy Tobacco Smoker    Packs/day: 2.00    Years: 49.00    Pack years: 98.00    Types: Cigarettes    Last attempt to quit: 06/13/2016    Years since quitting: 4.0  . Smokeless tobacco: Never Used  . Tobacco comment: started smoking at age 55  Substance Use Topics  . Alcohol use: Yes    Alcohol/week: 28.0 - 42.0 standard drinks    Types: 28 - 42 Cans of beer per week    Comment: 4-6 beers a day     A shared decision-making session was conducted prior to the performance of CT scan. This includes one or more decision aids, includes benefits and harms of screening, follow-up diagnostic testing, over-diagnosis, false positive rate, and total radiation exposure.   Counseling on the importance of adherence to annual lung cancer LDCT screening, impact of co-morbidities, and  ability or willingness to undergo diagnosis and treatment is imperative for compliance of the program.   Counseling on the importance of continued smoking cessation for former smokers; the importance of smoking cessation for current smokers, and information about tobacco cessation interventions have been given to patient including [place] and [facility] [place] programs.   Written order for lung cancer screening with LDCT has been given to the patient and any and all questions have been answered to the best of my abilities.    Yearly follow up will be coordinated by Burgess Estelle, Thoracic Navigator.  I discussed the assessment and treatment plan with the patient. The patient was provided an opportunity to ask questions and all were answered. The patient agreed with the plan and demonstrated an understanding of the instructions.   The patient was advised to call back or seek an in-person evaluation if the symptoms worsen or if the condition fails to improve as anticipated.   I provided 15 minutes of face-to-face video visit time dedicated to the care of this patient on the date of this encounter to include pre-visit review of smoking history, face-to-face time with the patient, and post visit ordering of testing/documentation.   Beckey Rutter, DNP, AGNP-C [place] at [facility] [phone] (clinic)

## 2020-07-23 ENCOUNTER — Other Ambulatory Visit: Payer: Self-pay

## 2020-07-23 ENCOUNTER — Encounter: Payer: Self-pay | Admitting: Psychiatry

## 2020-07-23 ENCOUNTER — Telehealth (INDEPENDENT_AMBULATORY_CARE_PROVIDER_SITE_OTHER): Payer: Medicare Other | Admitting: Psychiatry

## 2020-07-23 DIAGNOSIS — F09 Unspecified mental disorder due to known physiological condition: Secondary | ICD-10-CM

## 2020-07-23 DIAGNOSIS — F1010 Alcohol abuse, uncomplicated: Secondary | ICD-10-CM

## 2020-07-23 DIAGNOSIS — F17200 Nicotine dependence, unspecified, uncomplicated: Secondary | ICD-10-CM | POA: Diagnosis not present

## 2020-07-23 DIAGNOSIS — F0631 Mood disorder due to known physiological condition with depressive features: Secondary | ICD-10-CM

## 2020-07-23 DIAGNOSIS — F410 Panic disorder [episodic paroxysmal anxiety] without agoraphobia: Secondary | ICD-10-CM

## 2020-07-23 MED ORDER — BUSPIRONE HCL 30 MG PO TABS: 15.0000 mg | ORAL_TABLET | ORAL | 1 refills | Status: DC

## 2020-07-23 NOTE — Progress Notes (Signed)
Virtual Visit via Video Note  I connected with Chase [name] on 07/23/20 at  9:00 AM EDT by a video enabled telemedicine application and verified that I am speaking with the correct person using two identifiers.  Location Provider Location : [facility] Patient Location : Home  Participants: Patient , Spouse,Provider   I discussed the limitations of evaluation and management by telemedicine and the availability of in person appointments. The patient expressed understanding and agreed to proceed.     I discussed the assessment and treatment plan with the patient. The patient was provided an opportunity to ask questions and all were answered. The patient agreed with the plan and demonstrated an understanding of the instructions.   The patient was advised to call back or seek an in-person evaluation if the symptoms worsen or if the condition fails to improve as anticipated.  [place] MD OP Progress Note  07/23/2020 9:23 AM Chase [name]  MRN:  [id number]  Chief Complaint:  Chief Complaint    Follow-up; Anxiety     HPI: Chase [name] is a 70 year old Caucasian male who has a history of depression, panic attacks, cognitive disorder, alcohol use disorder, tobacco use disorder was evaluated by telemedicine today.  Patient today reports he is currently doing well. He does report occasional sadness however he is able to cope with it.  He denies any hopelessness, crying spells.  He reports appetite is fair.  He reports energy level is good.  He has been out working in his garden and that is therapeutic.  He continues to have good support system from his wife.  Reports sleep is good.  Patient denies any suicidality, homicidality or perceptual disturbances.  Patient continues to drink alcohol 3-4 drinks per day and is not interested in cutting back.  Patient is also not willing to cut back or quit his smoking habits.  Patient reports he is interested in cutting back the BuSpar if possible  since his symptoms has been stable.  Patient denies any other concerns today.  Visit Diagnosis:    ICD-10-CM   1. 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 Panic attacks  F41.0 busPIRone (BUSPAR) 30 MG tablet  3. Tobacco use disorder  F17.200   4. Alcohol use disorder, mild, abuse  F10.10   5. Mild cognitive disorder  F09     Past Psychiatric History: I have reviewed past psychiatric history from my progress note on 08/10/2017.  Past trials of Effexor, Wellbutrin, Cymbalta, Xanax, Klonopin  Past Medical History:  Past Medical History:  Diagnosis Date  . Cerebrovascular accident (CVA) ([place]) 10/09/2011  . Clotting disorder ([place])   . DVT (deep venous thrombosis) ([place])   . Hearing deficit   . History of ischemic vertebrobasilar artery thalamic stroke 2001  . History of stroke   . Hyperlipidemia   . Hypertension   . Patent foramen ovale   . Right knee DJD   . Stroke [facility]) 2001    Past Surgical History:  Procedure Laterality Date  . APPENDECTOMY    . FRACTURE SURGERY     R leg  . INCISION AND DRAINAGE Right 06/30/2012   Procedure: INCISION AND DRAINAGE;  Surgeon: Lorn Junes, MD;  Location: [place];  Service: Orthopedics;  Laterality: Right;  . KNEE ARTHROSCOPY     right knee  . KNEE BURSECTOMY Right 06/30/2012   Procedure: KNEE BURSECTOMY;  Surgeon: Lorn Junes, MD;  [facility];  Service: Orthopedics;  Laterality: Right;  Pre-patellar with wound closure  . TOTAL KNEE ARTHROPLASTY  01/26/2012   Procedure: TOTAL KNEE ARTHROPLASTY;  Surgeon: Lorn Junes, MD;  Location: [place];  Service: Orthopedics;  Laterality: Right;    Family Psychiatric History: I have reviewed family psychiatric history from my progress note on 08/10/2017  Family History:  Family History  Problem Relation Age of Onset  . Cancer Father   . Seizures Brother     Social History: Reviewed social history from my progress note on  08/10/2017 Social History   Socioeconomic History  . Marital status: Married    Spouse name: debra  . Number of children: 0  . Years of education: 35  . Highest education level: Master's degree (e.g., MA, MS, MEng, MEd, MSW, MBA)  Occupational History  . Occupation: retired  . Occupation: disabled @ age 72  Tobacco Use  . Smoking status: Heavy Tobacco Smoker    Packs/day: 2.00    Years: 49.00    Pack years: 98.00    Types: Cigarettes    Last attempt to quit: 06/13/2016    Years since quitting: 4.1  . Smokeless tobacco: Never Used  . Tobacco comment: started smoking at age 30  Vaping Use  . Vaping Use: Never used  Substance and Sexual Activity  . Alcohol use: Yes    Alcohol/week: 28.0 - 42.0 standard drinks    Types: 28 - 42 Cans of beer per week    Comment: 4-6 beers a day  . Drug use: No  . Sexual activity: Not on file  Other Topics Concern  . Not on file  Social History Narrative  . Not on file   Social Determinants of Health   Financial Resource Strain: Low Risk   . Difficulty of Paying Living Expenses: Not hard at all  Food Insecurity: No Food Insecurity  . Worried About Charity fundraiser in the Last Year: Never true  . Ran Out of Food in the Last Year: Never true  Transportation Needs: No Transportation Needs  . Lack of Transportation (Medical): No  . Lack of Transportation (Non-Medical): No  Physical Activity: Inactive  . Days of Exercise per Week: 0 days  . Minutes of Exercise per Session: 0 min  Stress: No Stress Concern Present  . Feeling of Stress : Not at all  Social Connections: Moderately Isolated  . Frequency of Communication with Friends and Family: Once a week  . Frequency of Social Gatherings with Friends and Family: More than three times a week  . Attends Religious Services: Never  . Active Member of Clubs or Organizations: No  . Attends Archivist Meetings: Never  . Marital Status: Married    Allergies:  Allergies  Allergen  Reactions  . Chantix [Varenicline Tartrate]     confusion    Metabolic Disorder Labs: No results found for: HGBA1C, MPG No results found for: PROLACTIN Lab Results  Component Value Date   CHOL 178 07/04/2020   TRIG 47 07/04/2020   HDL 67 07/04/2020   CHOLHDL 2.7 07/04/2020   LDLCALC 102 (H) 07/04/2020   LDLCALC 82 06/17/2019   Lab Results  Component Value Date   TSH 2.25 03/13/2017    Therapeutic Level Labs: No results found for: LITHIUM No results found for: VALPROATE No components found for:  CBMZ  Current Medications: Current Outpatient Medications  Medication Sig Dispense Refill  . amLODipine-benazepril (LOTREL) 5-10 MG capsule Take 1 capsule by mouth daily.  90 capsule 0  . DULoxetine (CYMBALTA) 60 MG capsule Take 1 capsule (60 mg total) by mouth daily. 90 capsule 1  . hydrochlorothiazide (HYDRODIURIL) 25 MG tablet Take 1 tablet (25 mg total) by mouth daily. 90 tablet 4  . hydrOXYzine (VISTARIL) 25 MG capsule Take 1 capsule (25 mg total) by mouth daily as needed for anxiety (for sever panic sx). 90 capsule 1  . montelukast (SINGULAIR) 10 MG tablet TAKE 1 TABLET DAILY FOR COUGH, NASAL DRAINAGE AND ALLERGIES 90 tablet 0  . traMADol (ULTRAM) 50 MG tablet     . warfarin (COUMADIN) 5 MG tablet TAKE 1 OR 2 TABLETS (5 OR 10MG ) DAILY AS DIRECTED BY PHYSICIAN 180 tablet 3  . acetaminophen (TYLENOL) 325 MG tablet Take 2 tablets (650 mg total) by mouth every 6 (six) hours as needed for pain. 60 tablet 0  . amoxicillin (AMOXIL) 500 MG tablet Take by mouth as directed. 1 hour prior to dental procedures (Patient not taking: No sig reported)    . busPIRone (BUSPAR) 30 MG tablet Take 0.5-1 tablets (15-30 mg total) by mouth as directed. Start taking 30 mg daily in the AM and 15 mg daily PM - DOSE REDUCTION 180 tablet 1  . chlorhexidine (PERIDEX) 0.12 % solution  (Patient not taking: No sig reported)    . fluticasone (FLONASE) 50 MCG/ACT nasal spray Place 1 spray into both nostrils as  needed.      No current facility-administered medications for this visit.     Musculoskeletal: Strength & Muscle Tone: UTA Gait & Station: UTA Patient leans: N/A  Psychiatric Specialty Exam: Review of Systems  Psychiatric/Behavioral: Negative for agitation, behavioral problems, confusion, decreased concentration, dysphoric mood, hallucinations, self-injury, sleep disturbance and suicidal ideas. The patient is not nervous/anxious and is not hyperactive.   All other systems reviewed and are negative.   There were no vitals taken for this visit.There is no height or weight on file to calculate BMI.  General Appearance: Casual  Eye Contact:  Fair  Speech:  Normal Rate  Volume:  Normal  Mood:  Euthymic  Affect:  Congruent  Thought Process:  Goal Directed and Descriptions of Associations: Intact  Orientation:  Full (Time, Place, and Person), patient reports he checked the date prior to appointment which helped to remember and he was able to read out his address which helped to answer correctly.  Thought Content: Logical   Suicidal Thoughts:  No  Homicidal Thoughts:  No  Memory:  Immediate;   Fair Recent;   Fair Remote;   Limited  Judgement:  Fair  Insight:  Fair  Psychomotor Activity:  Normal  Concentration:  Concentration: Fair and Attention Span: Fair  Recall:  [employer] of Knowledge: Fair  Language: Fair  Akathisia:  No  Handed:  Right  AIMS (if indicated):UTA  Assets:  Communication Skills Desire for Improvement Housing Social Support  ADL's:  Intact  Cognition: Mild  Sleep:  Fair   Screenings: AUDIT   Flowsheet Row Office Visit from 07/04/2020 in [place] from 06/19/2020 in [place] from [facility]/2019 [facility]  Alcohol Use Disorder Identification Test Final Score (AUDIT) 12 13 10     GAD-7   Flowsheet Row Video Visit from 07/23/2020 in [place]  Office Visit from 02/23/2019 in [place]  Total GAD-7 Score 1 4    PHQ2-9   Flowsheet Row Video Visit from 07/23/2020 in [place]  Visit from 07/04/2020 in [place] from 06/19/2020 in [place] Visit from 05/09/2020 in [facility] Video Visit from 04/12/2020 in [place]  PHQ-2 Total Score 1 0 0 1 0  PHQ-9 Total Score -- 0 -- 1 0       Assessment and Plan: ALANO BLASCO is a 70 year old Caucasian male who has a history of depression, panic attacks, cognitive impairment, alcohol use disorder, tobacco use disorder was evaluated by telemedicine today.  Patient with history of left-sided thalamic stroke, cognitive impairment currently stable on current medication regimen is currently stable with regards to his mood.  Patient however continues to have comorbid alcoholism.  Plan as noted below.  Plan Depressive disorder due to medical condition-left-sided stroke in remission PHQ 2-1 Cymbalta 60 mg p.o. daily Reduce BuSpar to 30 mg p.o. daily in the morning and 15 mg p.o. daily in the evening.  If patient does well with the dose reduction he could stop taking the 15 mg in the evening after a month or so.  However if he notices any worsening symptoms he could go back to BuSpar 30 mg p.o. twice daily.  Panic attacks-stable BuSpar as prescribed Cymbalta 60 mg p.o. daily Hydroxyzine 25 mg daily as needed for severe anxiety attacks only  Alcohol use disorder-mild-unstable He continues to use 4 beers per day, 12 ounces. Provided counseling.  Tobacco use disorder-unstable He is not willing to cut back or.  Collateral information obtained from wife who is supportive and reports patient does remaining stable.  This note was generated in part or whole with voice recognition software. Voice recognition is usually quite accurate but  there are transcription errors that can and very often do occur. I apologize for any typographical errors that were not detected and corrected.       Ursula Alert, MD 07/23/2020, 9:23 AM

## 2020-07-24 ENCOUNTER — Telehealth: Payer: Self-pay

## 2020-07-24 ENCOUNTER — Telehealth: Payer: Self-pay | Admitting: *Deleted

## 2020-07-24 DIAGNOSIS — I712 Thoracic aortic aneurysm, without rupture, unspecified: Secondary | ICD-10-CM

## 2020-07-24 DIAGNOSIS — J432 Centrilobular emphysema: Secondary | ICD-10-CM

## 2020-07-24 DIAGNOSIS — J439 Emphysema, unspecified: Secondary | ICD-10-CM | POA: Insufficient documentation

## 2020-07-24 DIAGNOSIS — I700 Atherosclerosis of aorta: Secondary | ICD-10-CM

## 2020-07-24 NOTE — Addendum Note (Signed)
Addended by: Birdie Sons on: 07/24/2020 04:22 PM   Modules accepted: Orders

## 2020-07-24 NOTE — Telephone Encounter (Signed)
Notified patient's wife of LDCT lung cancer screening program results with recommendation for 12 month follow up imaging. Also notified of incidental findings noted below and is encouraged to discuss further with PCP who will receive a copy of this note and/or the CT report. Patient's wife verbalizes understanding. Particularly, the importance of follow up of aneurysm.  IMPRESSION: 1. Lung-RADS 2, benign appearance or behavior. Continue annual screening with low-dose chest CT without contrast in 12 months. 2. 1.5 cm medial left upper lobe nodule has demonstrable macroscopic fat both qualitatively and quantitatively. This nodule was present on the CT scan from more than 8 years ago and is only minimally increased in the interval (from 1.2 cm on the remote exam), consistent with benign etiology. Imaging features are compatible with pulmonary hamartoma. 3. 4.5 cm diameter ascending thoracic aorta. Ascending thoracic aortic aneurysm. Recommend semi-annual imaging followup by CTA or MRA and referral to cardiothoracic surgery if not already obtained. This recommendation follows 2010 ACCF/AHA/AATS/ACR/ASA/SCA/SCAI/SIR/STS/SVM Guidelines for the Diagnosis and Management of Patients With Thoracic Aortic Disease. Circulation. 2010; 121: S438-P779. Aortic aneurysm NOS (ICD10-I71.9) 4.  Emphysema (ICD10-J43.9) and Aortic Atherosclerosis (ICD10-170.0)

## 2020-07-24 NOTE — Telephone Encounter (Signed)
Please advise patient's wife that CT scan show aneurysm of aorta in his chest. It is probably not large enough to require surgery, but needs to be follow up a thoracic surgeon. Have placed referral order to see [facility].

## 2020-07-24 NOTE — Telephone Encounter (Signed)
I called pt and pt's wife, [name], answered. [name] verbalized understanding of information below.

## 2020-08-01 ENCOUNTER — Other Ambulatory Visit: Payer: Self-pay

## 2020-08-01 ENCOUNTER — Ambulatory Visit (INDEPENDENT_AMBULATORY_CARE_PROVIDER_SITE_OTHER): Payer: Medicare Other | Admitting: *Deleted

## 2020-08-01 DIAGNOSIS — Z86718 Personal history of other venous thrombosis and embolism: Secondary | ICD-10-CM

## 2020-08-01 NOTE — Patient Instructions (Signed)
Dx: History of DVT Current Coumadin dose:  5mg  daily, except 10mg  on M,W, F INR: 2.5 PT: 30.1 Today's Changes: No Change Recheck: 4 weeks

## 2020-08-06 ENCOUNTER — Ambulatory Visit (INDEPENDENT_AMBULATORY_CARE_PROVIDER_SITE_OTHER): Payer: Medicare Other | Admitting: Vascular Surgery

## 2020-08-06 ENCOUNTER — Other Ambulatory Visit: Payer: Self-pay

## 2020-08-06 ENCOUNTER — Encounter (INDEPENDENT_AMBULATORY_CARE_PROVIDER_SITE_OTHER): Payer: Self-pay | Admitting: Vascular Surgery

## 2020-08-06 VITALS — BP 136/76 | HR 86 | Resp 16 | Ht 70.0 in | Wt 211.0 lb

## 2020-08-06 DIAGNOSIS — I712 Thoracic aortic aneurysm, without rupture, unspecified: Secondary | ICD-10-CM

## 2020-08-06 DIAGNOSIS — J432 Centrilobular emphysema: Secondary | ICD-10-CM | POA: Diagnosis not present

## 2020-08-06 DIAGNOSIS — I10 Essential (primary) hypertension: Secondary | ICD-10-CM | POA: Diagnosis not present

## 2020-08-06 NOTE — Progress Notes (Signed)
MRN : [id number]  Chase [name] is a 70 y.o. ([birth year]) male who presents with chief complaint of No chief complaint on file. Marland Kitchen  History of Present Illness:   The patient presents to the office for evaluation of a thoracic aortic aneurysm. The aneurysm was found incidentally by CT scan. Patient denies chest pain or unusual back pain, no other pulmonary complaints.  No history of an acute onset of painful blue discoloration of the toes.     No family history of AAA/TAA.   Patient denies amaurosis fugax or TIA symptoms. There is no history of claudication or rest pain symptoms of the lower extremities.  The patient denies angina or shortness of breath.  CT scan dated 07/17/2020 shows a TAA that measures 4.6 cm in the ascending and 3.5 cm from the descending  No outpatient medications have been marked as taking for the 08/06/20 encounter (Appointment) with Delana Meyer, Dolores Lory, MD.    Past Medical History:  Diagnosis Date  . Cerebrovascular accident (CVA) ([place]) 10/09/2011  . Clotting disorder ([place])   . DVT (deep venous thrombosis) ([place])   . Hearing deficit   . History of ischemic vertebrobasilar artery thalamic stroke 2001  . History of stroke   . Hyperlipidemia   . Hypertension   . Patent foramen ovale   . Right knee DJD   . Stroke [facility]) 2001    Past Surgical History:  Procedure Laterality Date  . APPENDECTOMY    . FRACTURE SURGERY     R leg  . INCISION AND DRAINAGE Right 06/30/2012   Procedure: INCISION AND DRAINAGE;  Surgeon: Lorn Junes, MD;  Location: [place];  Service: Orthopedics;  Laterality: Right;  . KNEE ARTHROSCOPY     right knee  . KNEE BURSECTOMY Right 06/30/2012   Procedure: KNEE BURSECTOMY;  Surgeon: Lorn Junes, MD;  Location: [place];  Service: Orthopedics;  Laterality: Right;  Pre-patellar with wound closure  . TOTAL KNEE ARTHROPLASTY  01/26/2012   Procedure: TOTAL KNEE ARTHROPLASTY;  Surgeon: Lorn Junes, MD;  Location: [place];  Service: Orthopedics;   Laterality: Right;    Social History Social History   Tobacco Use  . Smoking status: Heavy Tobacco Smoker    Packs/day: 2.00    Years: 49.00    Pack years: 98.00    Types: Cigarettes    Last attempt to quit: 06/13/2016    Years since quitting: 4.1  . Smokeless tobacco: Never Used  . Tobacco comment: started smoking at age 50  Vaping Use  . Vaping Use: Never used  Substance Use Topics  . Alcohol use: Yes    Alcohol/week: 28.0 - 42.0 standard drinks    Types: 28 - 42 Cans of beer per week    Comment: 4-6 beers a day  . Drug use: No    Family History Family History  Problem Relation Age of Onset  . Cancer Father   . Seizures Brother   No family history of bleeding/clotting disorders, porphyria or autoimmune disease   Allergies  Allergen Reactions  . Chantix [Varenicline Tartrate]     confusion     REVIEW OF SYSTEMS (Negative unless checked)  Constitutional: [] Weight loss  [] Fever  [] Chills Cardiac: [] Chest pain   [] Chest pressure   [] Palpitations   [] Shortness of breath when laying flat   [] Shortness of breath with exertion. Vascular:  [] Pain in legs with walking   [] Pain in legs at rest  [] History of DVT   [] Phlebitis   []   Swelling in legs   [] Varicose veins   [] Non-healing ulcers Pulmonary:   [] Uses home oxygen   [] Productive cough   [] Hemoptysis   [] Wheeze  [] COPD   [] Asthma Neurologic:  [] Dizziness   [] Seizures   [] History of stroke   [] History of TIA  [] Aphasia   [] Vissual changes   [] Weakness or numbness in arm   [] Weakness or numbness in leg Musculoskeletal:   [] Joint swelling   [] Joint pain   [] Low back pain Hematologic:  [] Easy bruising  [] Easy bleeding   [] Hypercoagulable state   [] Anemic Gastrointestinal:  [] Diarrhea   [] Vomiting  [] Gastroesophageal reflux/heartburn   [] Difficulty swallowing. Genitourinary:  [] Chronic kidney disease   [] Difficult urination  [] Frequent urination   [] Blood in urine Skin:  [] Rashes   [] Ulcers  Psychological:  [] History of  anxiety   []  History of major depression.  Physical Examination  There were no vitals filed for this visit. There is no height or weight on file to calculate BMI. Gen: WD/WN, NAD Head: [place]/AT, No temporalis wasting.  Ear/Nose/Throat: Hearing grossly intact, nares w/o erythema or drainage, poor dentition Eyes: PER, EOMI, sclera nonicteric.  Neck: Supple, no masses.  No bruit or JVD.  Pulmonary:  Good air movement, clear to auscultation bilaterally, no use of accessory muscles.  Cardiac: RRR, normal S1, S2, no Murmurs. Vascular:  Vessel Right Left  Radial Palpable Palpable  Popliteal  broadened palpable  broadened palpable  Gastrointestinal: soft, non-distended. No guarding/no peritoneal signs.  Musculoskeletal: M/S 5/5 throughout.  No deformity or atrophy.  Neurologic: CN 2-12 intact. Pain and light touch intact in extremities.  Symmetrical.  Speech is fluent. Motor exam as listed above. Psychiatric: Judgment intact, Mood & affect appropriate for pt's clinical situation. Dermatologic: No rashes or ulcers noted.  No changes consistent with cellulitis. Lymph : No Cervical lymphadenopathy, no lichenification or skin changes of chronic lymphedema.  CBC Lab Results  Component Value Date   WBC 5.4 07/04/2020   HGB 14.9 07/04/2020   HCT 42.0 07/04/2020   MCV 92 07/04/2020   PLT 219 07/04/2020    BMET    Component Value Date/Time   NA 135 07/04/2020 0914   K 5.2 07/04/2020 0914   CL 96 07/04/2020 0914   CO2 22 07/04/2020 0914   GLUCOSE 106 (H) 07/04/2020 0914   GLUCOSE 81 03/13/2017 1538   BUN 11 07/04/2020 0914   CREATININE 1.05 07/04/2020 0914   CREATININE 1.08 03/13/2017 1538   CALCIUM 9.7 07/04/2020 0914   GFRNONAA 66 06/17/2019 1434   GFRNONAA 71 03/13/2017 1538   GFRAA 77 06/17/2019 1434   GFRAA 82 03/13/2017 1538   CrCl cannot be calculated (Patient's most recent lab result is older than the maximum 21 days allowed.).  COAG Lab Results  Component Value Date   INR  2.4 07/04/2020   INR 2.7 06/06/2020   INR 2.2 05/09/2020    Radiology CT CHEST LUNG CANCER SCREENING LOW DOSE WO CONTRAST  Result Date: 07/18/2020 CLINICAL DATA:  70 year old male with 15 pack-year history of smoking. Lung cancer screening. EXAM: CT CHEST WITHOUT CONTRAST LOW-DOSE FOR LUNG CANCER SCREENING TECHNIQUE: Multidetector CT imaging of the chest was performed following the standard protocol without IV contrast. COMPARISON:  None. FINDINGS: Cardiovascular: The heart size is normal. No substantial pericardial effusion. Coronary artery calcification is evident. Atherosclerotic calcification is noted in the wall of the thoracic aorta. Ascending thoracic aorta measures 4.5 cm diameter. Mediastinum/Nodes: No mediastinal lymphadenopathy. No evidence for gross hilar lymphadenopathy although assessment is limited  by the lack of intravenous contrast on today's study. The esophagus has normal imaging features. There is no axillary lymphadenopathy. Lungs/Pleura: Centrilobular emphsyema noted. Right apical pleuroparenchymal scarring evident. 5.8 mm right middle lobe perifissural nodule is stable since 04/13/2012 CTA Chest. 1.5 cm medial left upper lobe nodule (image 174/series 3) is not substantially changed from 1.2 cm on CTA Chest 04/13/2012. On both exams, this nodule has demonstrable macroscopic intralesional fat both quantitatively and qualitatively. Multiple additional tiny left pulmonary nodules measure up to maximum volume derived equivalent diameter of 5.3 mm. Most of these were present on the remote CTA Chest although a subpleural left upper lobe medial nodule on 100/3 is new in the interval. No focal airspace consolidation. No pleural effusion. Upper Abdomen: Unremarkable. Musculoskeletal: No worrisome lytic or sclerotic osseous abnormality. IMPRESSION: 1. Lung-RADS 2, benign appearance or behavior. Continue annual screening with low-dose chest CT without contrast in 12 months. 2. 1.5 cm medial left  upper lobe nodule has demonstrable macroscopic fat both qualitatively and quantitatively. This nodule was present on the CT scan from more than 8 years ago and is only minimally increased in the interval (from 1.2 cm on the remote exam), consistent with benign etiology. Imaging features are compatible with pulmonary hamartoma. 3. 4.5 cm diameter ascending thoracic aorta. Ascending thoracic aortic aneurysm. Recommend semi-annual imaging followup by CTA or MRA and referral to cardiothoracic surgery if not already obtained. This recommendation follows 2010 ACCF/AHA/AATS/ACR/ASA/SCA/SCAI/SIR/STS/SVM Guidelines for the Diagnosis and Management of Patients With Thoracic Aortic Disease. Circulation. 2010; 121: E831-D176. Aortic aneurysm NOS (ICD10-I71.9) 4.  Emphysema (ICD10-J43.9) and Aortic Atherosclerosis (ICD10-170.0) Electronically Signed   By: Misty Stanley M.D.   On: 07/18/2020 13:11     Assessment/Plan 1. Thoracic aortic aneurysm without rupture ([place]) No surgery or intervention at this time. The patient has an asymptomatic ascending thoracic aortic aneurysm that is less than 4 cm in maximal diameter.  I have discussed the natural history of abdominal aortic aneurysm and the small risk of rupture for aneurysm less than 6 cm in size.  However, as these small aneurysms tend to enlarge over time, continued surveillance with CT scan is mandatory.  I have also discussed optimizing medical management with hypertension and lipid control and the importance of abstinence from tobacco.  The patient is also encouraged to exercise a minimum of 30 minutes 4 times a week.  Should the patient develop new onset chest or back pain or signs of peripheral embolization they are instructed to seek medical attention immediately and to alert the physician providing care that they have an aneurysm.  The patient voices their understanding. The patient will return in 12 months with a noncontrasted CT  Considering the finding of  prominent impulses in his popliteals I will obtain a duplex ultrasound of the lower extremities as well as a abdominal aortic ultrasound.  - VAS US AORTA/IVC/ILIACS; Future - VAS [place] LOWER EXTREMITY ARTERIAL DUPLEX; Future  2. Primary hypertension Continue antihypertensive medications as already ordered, these medications have been reviewed and there are no changes at this time.   3. Centrilobular emphysema ([place]) Continue pulmonary medications and aerosols as already ordered, these medications have been reviewed and there are no changes at this time.      Hortencia Pilar, MD  08/06/2020 11:55 AM

## 2020-08-29 ENCOUNTER — Other Ambulatory Visit: Payer: Self-pay

## 2020-08-29 ENCOUNTER — Ambulatory Visit (INDEPENDENT_AMBULATORY_CARE_PROVIDER_SITE_OTHER): Payer: Medicare Other

## 2020-08-29 DIAGNOSIS — Z86718 Personal history of other venous thrombosis and embolism: Secondary | ICD-10-CM | POA: Diagnosis not present

## 2020-08-29 LAB — POCT INR
INR: 2.5 (ref 2.0–3.0)
PT: 30.6

## 2020-09-06 ENCOUNTER — Other Ambulatory Visit: Payer: Self-pay | Admitting: Family Medicine

## 2020-09-06 DIAGNOSIS — J301 Allergic rhinitis due to pollen: Secondary | ICD-10-CM

## 2020-09-06 NOTE — Telephone Encounter (Signed)
Requested Prescriptions  Pending Prescriptions Disp Refills  . montelukast (SINGULAIR) 10 MG tablet [Pharmacy Med Name: MONTELUKAST SODIUM 10 MG Tablet] 90 tablet 0    Sig: TAKE 1 TABLET DAILY FOR COUGH, NASAL DRAINAGE AND ALLERGIES     Pulmonology:  Leukotriene Inhibitors Passed - 09/06/2020  5:52 AM      Passed - Valid encounter within last 12 months    Recent Outpatient Visits          2 months ago Primary hypertension   [facility] Birdie Sons, MD   3 months ago Essential hypertension   [facility] Birdie Sons, MD   4 months ago Essential hypertension   [facility] Birdie Sons, MD   1 year ago Essential hypertension   [facility] Birdie Sons, MD   2 years ago Essential hypertension   [facility] Birdie Sons, MD      Future Appointments            In 2 months Fisher, Kirstie Peri, MD [facility], [place]

## 2020-09-10 ENCOUNTER — Ambulatory Visit (INDEPENDENT_AMBULATORY_CARE_PROVIDER_SITE_OTHER): Payer: Medicare Other | Admitting: Vascular Surgery

## 2020-09-10 ENCOUNTER — Encounter (INDEPENDENT_AMBULATORY_CARE_PROVIDER_SITE_OTHER): Payer: Self-pay | Admitting: Vascular Surgery

## 2020-09-10 ENCOUNTER — Ambulatory Visit (INDEPENDENT_AMBULATORY_CARE_PROVIDER_SITE_OTHER): Payer: Medicare Other

## 2020-09-10 ENCOUNTER — Other Ambulatory Visit: Payer: Self-pay

## 2020-09-10 VITALS — BP 157/84 | HR 65 | Ht 70.0 in | Wt 208.0 lb

## 2020-09-10 DIAGNOSIS — I712 Thoracic aortic aneurysm, without rupture, unspecified: Secondary | ICD-10-CM

## 2020-09-10 DIAGNOSIS — I10 Essential (primary) hypertension: Secondary | ICD-10-CM | POA: Diagnosis not present

## 2020-09-10 DIAGNOSIS — J432 Centrilobular emphysema: Secondary | ICD-10-CM

## 2020-09-10 NOTE — Progress Notes (Signed)
MRN : [id number]  Chase [name] is a 70 y.o. (Oct 17, 1950) male who presents with chief complaint of  Chief Complaint  Patient presents with  . Follow-up    Pt conv. Aorta iliac . LE arterial   .  History of Present Illness:  The patient returns to the office for followup and review of the noninvasive studies. There have been no interval changes in lower extremity symptoms. No interval shortening of the patient's claudication distance or development of rest pain symptoms. No new ulcers or wounds have occurred since the last visit.  There have been no significant changes to the patient's overall health care.  The patient denies amaurosis fugax or recent TIA symptoms. There are no recent neurological changes noted. The patient denies history of DVT, PE or superficial thrombophlebitis. The patient denies recent episodes of angina or shortness of breath.   Duplex ultrasound of the abdominal aorta shows the infrarenal aorta measures 2.52 cm.  The lower extremities are widely patent and there are no aneurysms noted   Current Meds  Medication Sig  . acetaminophen (TYLENOL) 325 MG tablet Take 2 tablets (650 mg total) by mouth every 6 (six) hours as needed for pain.  Marland Kitchen amLODipine-benazepril (LOTREL) 5-10 MG capsule Take 1 capsule by mouth daily.  Marland Kitchen amoxicillin (AMOXIL) 500 MG tablet Take by mouth as directed. 1 hour prior to dental procedures  . busPIRone (BUSPAR) 30 MG tablet Take 0.5-1 tablets (15-30 mg total) by mouth as directed. Start taking 30 mg daily in the AM and 15 mg daily PM - DOSE REDUCTION  . chlorhexidine (PERIDEX) 0.12 % solution   . DULoxetine (CYMBALTA) 60 MG capsule Take 1 capsule (60 mg total) by mouth daily.  . fluticasone (FLONASE) 50 MCG/ACT nasal spray Place 1 spray into both nostrils as needed.   . hydrochlorothiazide (HYDRODIURIL) 25 MG tablet Take 1 tablet (25 mg total) by mouth daily.  . hydrOXYzine (VISTARIL) 25 MG capsule Take 1 capsule (25 mg total) by mouth  daily as needed for anxiety (for sever panic sx).  . montelukast (SINGULAIR) 10 MG tablet TAKE 1 TABLET DAILY FOR COUGH, NASAL DRAINAGE AND ALLERGIES  . traMADol (ULTRAM) 50 MG tablet   . warfarin (COUMADIN) 5 MG tablet TAKE 1 OR 2 TABLETS (5 OR 10MG ) DAILY AS DIRECTED BY PHYSICIAN    Past Medical History:  Diagnosis Date  . Cerebrovascular accident (CVA) ([place]) 10/09/2011  . Clotting disorder ([place])   . DVT (deep venous thrombosis) ([place])   . Hearing deficit   . History of ischemic vertebrobasilar artery thalamic stroke 2001  . History of stroke   . Hyperlipidemia   . Hypertension   . Patent foramen ovale   . Right knee DJD   . Stroke [facility]) 2001    Past Surgical History:  Procedure Laterality Date  . APPENDECTOMY    . FRACTURE SURGERY     R leg  . INCISION AND DRAINAGE Right 06/30/2012   Procedure: INCISION AND DRAINAGE;  Surgeon: Lorn Junes, MD;  Location: [place];  Service: Orthopedics;  Laterality: Right;  . KNEE ARTHROSCOPY     right knee  . KNEE BURSECTOMY Right 06/30/2012   Procedure: KNEE BURSECTOMY;  Surgeon: Lorn Junes, MD;  Location: [place];  Service: Orthopedics;  Laterality: Right;  Pre-patellar with wound closure  . TOTAL KNEE ARTHROPLASTY  01/26/2012   Procedure: TOTAL KNEE ARTHROPLASTY;  Surgeon: Lorn Junes, MD;  Location: [place];  Service: Orthopedics;  Laterality: Right;  Social History Social History   Tobacco Use  . Smoking status: Heavy Tobacco Smoker    Packs/day: 2.00    Years: 49.00    Pack years: 98.00    Types: Cigarettes    Last attempt to quit: 06/13/2016    Years since quitting: 4.2  . Smokeless tobacco: Never Used  . Tobacco comment: started smoking at age 36  Vaping Use  . Vaping Use: Never used  Substance Use Topics  . Alcohol use: Yes    Alcohol/week: 28.0 - 42.0 standard drinks    Types: 28 - 42 Cans of beer per week    Comment: 4-6 beers a day  . Drug use: No    Family History Family History  Problem Relation Age of  Onset  . Cancer Father   . Seizures Brother     Allergies  Allergen Reactions  . Chantix [Varenicline Tartrate]     confusion     REVIEW OF SYSTEMS (Negative unless checked)  Constitutional: [] Weight loss  [] Fever  [] Chills Cardiac: [] Chest pain   [] Chest pressure   [] Palpitations   [] Shortness of breath when laying flat   [] Shortness of breath with exertion. Vascular:  [] Pain in legs with walking   [] Pain in legs at rest  [] History of DVT   [] Phlebitis   [] Swelling in legs   [] Varicose veins   [] Non-healing ulcers Pulmonary:   [] Uses home oxygen   [] Productive cough   [] Hemoptysis   [] Wheeze  [] COPD   [] Asthma Neurologic:  [] Dizziness   [] Seizures   [] History of stroke   [] History of TIA  [] Aphasia   [] Vissual changes   [] Weakness or numbness in arm   [] Weakness or numbness in leg Musculoskeletal:   [] Joint swelling   [x] Joint pain   [] Low back pain Hematologic:  [] Easy bruising  [] Easy bleeding   [] Hypercoagulable state   [] Anemic Gastrointestinal:  [] Diarrhea   [] Vomiting  [] Gastroesophageal reflux/heartburn   [] Difficulty swallowing. Genitourinary:  [] Chronic kidney disease   [] Difficult urination  [] Frequent urination   [] Blood in urine Skin:  [] Rashes   [] Ulcers  Psychological:  [] History of anxiety   []  History of major depression.  Physical Examination  Vitals:   09/10/20 0936  BP: (!) 157/84  Pulse: 65  Weight: 208 lb (94.3 kg)  Height: 5\' 10"  (1.778 m)   Body mass index is 29.84 kg/m. Gen: WD/WN, NAD Head: [place]/AT, No temporalis wasting.  Ear/Nose/Throat: Hearing grossly intact, nares w/o erythema or drainage Eyes: PER, EOMI, sclera nonicteric.  Neck: Supple, no large masses.   Pulmonary:  Good air movement, no audible wheezing bilaterally, no use of accessory muscles.  Cardiac: RRR, no JVD Vascular: Vessel Right Left  Radial Palpable Palpable  PT Palpable Palpable  DP Palpable Palpable  Gastrointestinal: Non-distended. No guarding/no peritoneal signs.   Musculoskeletal: M/S 5/5 throughout.  No deformity or atrophy.  Neurologic: CN 2-12 intact. Symmetrical.  Speech is fluent. Motor exam as listed above. Psychiatric: Judgment intact, Mood & affect appropriate for pt's clinical situation. Dermatologic: No rashes or ulcers noted.  No changes consistent with cellulitis. Lymph : No lichenification or skin changes of chronic lymphedema.  CBC Lab Results  Component Value Date   WBC 5.4 07/04/2020   HGB 14.9 07/04/2020   HCT 42.0 07/04/2020   MCV 92 07/04/2020   PLT 219 07/04/2020    BMET    Component Value Date/Time   NA 135 07/04/2020 0914   K 5.2 07/04/2020 0914   CL 96 07/04/2020 0914  CO2 22 07/04/2020 0914   GLUCOSE 106 (H) 07/04/2020 0914   GLUCOSE 81 03/13/2017 1538   BUN 11 07/04/2020 0914   CREATININE 1.05 07/04/2020 0914   CREATININE 1.08 03/13/2017 1538   CALCIUM 9.7 07/04/2020 0914   GFRNONAA 66 06/17/2019 1434   GFRNONAA 71 03/13/2017 1538   GFRAA 77 06/17/2019 1434   GFRAA 82 03/13/2017 1538   CrCl cannot be calculated (Patient's most recent lab result is older than the maximum 21 days allowed.).  COAG Lab Results  Component Value Date   INR 2.5 08/29/2020   INR 2.4 07/04/2020   INR 2.7 06/06/2020    Radiology No results found.   Assessment/Plan 1. Thoracic aortic aneurysm without rupture ([place]) No surgery or intervention at this time. The patient has an asymptomatic ascending thoracic aortic aneurysm that is less than 4 cm in maximal diameter.  I have discussed the natural history of abdominal aortic aneurysm and the small risk of rupture for aneurysm less than 6 cm in size.  However, as these small aneurysms tend to enlarge over time, continued surveillance with CT scan is mandatory.  I have also discussed optimizing medical management with hypertension and lipid control and the importance of abstinence from tobacco.  The patient is also encouraged to exercise a minimum of 30 minutes 4 times a week.   Should the patient develop new onset chest or back pain or signs of peripheral embolization they are instructed to seek medical attention immediately and to alert the physician providing care that they have an aneurysm.  The patient voices their understanding. The patient will return in 12 months with a noncontrasted CT  Considering the finding of prominent impulses in his popliteals I will obtain a duplex ultrasound of the lower extremities as well as a abdominal aortic ultrasound.  2. Primary hypertension Continue antihypertensive medications as already ordered, these medications have been reviewed and there are no changes at this time.   3. Centrilobular emphysema ([place]) Continue pulmonary medications and aerosols as already ordered, these medications have been reviewed and there are no changes at this time.      Hortencia Pilar, MD  09/10/2020 9:52 AM

## 2020-09-11 DIAGNOSIS — L918 Other hypertrophic disorders of the skin: Secondary | ICD-10-CM | POA: Diagnosis not present

## 2020-09-11 DIAGNOSIS — D225 Melanocytic nevi of trunk: Secondary | ICD-10-CM | POA: Diagnosis not present

## 2020-09-11 DIAGNOSIS — D2262 Melanocytic nevi of left upper limb, including shoulder: Secondary | ICD-10-CM | POA: Diagnosis not present

## 2020-09-11 DIAGNOSIS — L538 Other specified erythematous conditions: Secondary | ICD-10-CM | POA: Diagnosis not present

## 2020-09-11 DIAGNOSIS — L821 Other seborrheic keratosis: Secondary | ICD-10-CM | POA: Diagnosis not present

## 2020-09-11 DIAGNOSIS — D2271 Melanocytic nevi of right lower limb, including hip: Secondary | ICD-10-CM | POA: Diagnosis not present

## 2020-09-11 DIAGNOSIS — D2272 Melanocytic nevi of left lower limb, including hip: Secondary | ICD-10-CM | POA: Diagnosis not present

## 2020-09-11 DIAGNOSIS — D2261 Melanocytic nevi of right upper limb, including shoulder: Secondary | ICD-10-CM | POA: Diagnosis not present

## 2020-09-14 ENCOUNTER — Other Ambulatory Visit: Payer: Self-pay | Admitting: Family Medicine

## 2020-09-14 DIAGNOSIS — I10 Essential (primary) hypertension: Secondary | ICD-10-CM

## 2020-09-14 NOTE — Telephone Encounter (Signed)
Requested Prescriptions  Pending Prescriptions Disp Refills  . amLODipine-benazepril (LOTREL) 5-10 MG capsule [Pharmacy Med Name: AMLODIPINE BESYLATE/BENAZEPRIL HYDROCHLORIDE 5-10 MG Capsule] 90 capsule 1    Sig: TAKE 1 CAPSULE EVERY DAY (NEED MD APPOINTMENT)     Cardiovascular: CCB + ACEI Combos Failed - 09/14/2020  3:30 AM      Failed - Last BP in normal range    BP Readings from Last 1 Encounters:  09/10/20 (!) 157/84         Passed - Cr in normal range and within 180 days    Creat  Date Value Ref Range Status  03/13/2017 1.08 0.70 - 1.25 mg/dL Final    Comment:    For patients >16 years of age, the reference limit for Creatinine is approximately 13% higher for people identified as African-American. .    Creatinine, Ser  Date Value Ref Range Status  07/04/2020 1.05 0.76 - 1.27 mg/dL Final         Passed - K in normal range and within 180 days    Potassium  Date Value Ref Range Status  07/04/2020 5.2 3.5 - 5.2 mmol/L Final         Passed - Patient is not pregnant      Passed - Valid encounter within last 6 months    Recent Outpatient Visits          2 months ago Primary hypertension   [facility] Birdie Sons, MD   3 months ago Essential hypertension   [facility] Birdie Sons, MD   4 months ago Essential hypertension   [facility] Birdie Sons, MD   1 year ago Essential hypertension   [facility] Birdie Sons, MD   2 years ago Essential hypertension   [facility] Birdie Sons, MD      Future Appointments            In 2 months Fisher, Kirstie Peri, MD [facility], [place]

## 2020-09-26 ENCOUNTER — Ambulatory Visit: Payer: Self-pay

## 2020-10-03 ENCOUNTER — Ambulatory Visit (INDEPENDENT_AMBULATORY_CARE_PROVIDER_SITE_OTHER): Payer: Medicare Other

## 2020-10-03 ENCOUNTER — Other Ambulatory Visit: Payer: Self-pay

## 2020-10-03 DIAGNOSIS — Z86718 Personal history of other venous thrombosis and embolism: Secondary | ICD-10-CM

## 2020-10-03 LAB — POCT INR
INR: 2.1 (ref 2.0–3.0)
Prothrombin Time: 25.3

## 2020-10-03 NOTE — Patient Instructions (Signed)
Description    Today's Changes: No Change Recheck: 4 weeks

## 2020-10-16 ENCOUNTER — Ambulatory Visit (INDEPENDENT_AMBULATORY_CARE_PROVIDER_SITE_OTHER): Payer: Medicare Other | Admitting: Psychiatry

## 2020-10-16 ENCOUNTER — Encounter: Payer: Self-pay | Admitting: Psychiatry

## 2020-10-16 ENCOUNTER — Other Ambulatory Visit: Payer: Self-pay

## 2020-10-16 VITALS — BP 170/91 | HR 66 | Temp 98.1°F | Wt 210.8 lb

## 2020-10-16 DIAGNOSIS — F17200 Nicotine dependence, unspecified, uncomplicated: Secondary | ICD-10-CM | POA: Diagnosis not present

## 2020-10-16 DIAGNOSIS — I10 Essential (primary) hypertension: Secondary | ICD-10-CM

## 2020-10-16 DIAGNOSIS — F410 Panic disorder [episodic paroxysmal anxiety] without agoraphobia: Secondary | ICD-10-CM

## 2020-10-16 DIAGNOSIS — F09 Unspecified mental disorder due to known physiological condition: Secondary | ICD-10-CM

## 2020-10-16 DIAGNOSIS — F1010 Alcohol abuse, uncomplicated: Secondary | ICD-10-CM | POA: Diagnosis not present

## 2020-10-16 DIAGNOSIS — F0631 Mood disorder due to known physiological condition with depressive features: Secondary | ICD-10-CM

## 2020-10-16 DIAGNOSIS — G2401 Drug induced subacute dyskinesia: Secondary | ICD-10-CM

## 2020-10-16 MED ORDER — BUSPIRONE HCL 30 MG PO TABS: 15.0000 mg | ORAL_TABLET | Freq: Two times a day (BID) | ORAL | 1 refills | Status: DC

## 2020-10-16 MED ORDER — DULOXETINE HCL 60 MG PO CPEP: 60.0000 mg | ORAL_CAPSULE | Freq: Every day | ORAL | 1 refills | Status: DC

## 2020-10-16 NOTE — Patient Instructions (Signed)
Please start taking Buspar 15 mg twice a day . Dose reduction.  Please follow up with Primary care for elevated Blood pressure reading .

## 2020-10-16 NOTE — Progress Notes (Signed)
[facility] MD OP Progress Note  10/16/2020 3:54 PM Chase [name]  MRN:  [id number]  Chief Complaint:  Chief Complaint   Follow-up; Anxiety; Depression    HPI: Chase [name] is a 70 year old Caucasian male, has a history of depression, panic attacks, cognitive disorder, alcohol use disorder, tobacco use disorder was evaluated in office today.  Patient today reports he is overall doing well.  He has not had any significant anxiety attacks recently.  There was a few times when he felt anxious in social situations however the hydroxyzine as needed helps when he takes it to get through those kind of situations.  He does not use it much and has been limiting use.  Since being on the reduced dosage of BuSpar he has not had any worsening mood symptoms.  He is tolerating the tapering process well.  Patient reports sleep is overall okay.  Patient denies any suicidality, homicidality or perceptual disturbances.  Patient continues to drink alcohol 3-4 drinks per day-12 ounce beer and is not willing to cut back or quit.  He is also not willing to cut back on smoking cigarettes.  Patient does report that his wife may have observed some movements around his mouth however he is not aware of this, it does not distress him.  In session today patient was observed to have movements of his upper lip, minimal.  Patient is not interested in medications for the same, reports it does not bother him.  Patient appeared to be [name], oriented to the month, year, self and situation.  He does have memory problems and his wife supports him socially and manages his medications at home.  Patient however continues to be able to drive and is independent otherwise.  Collateral information obtained from wife who sent a note about how patient is doing.  According to wife patient is currently doing well and no significant mood symptoms or side effects to medications.  Visit Diagnosis:    ICD-10-CM   1. 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 Panic attacks  F41.0 busPIRone (BUSPAR) 30 MG tablet    DULoxetine (CYMBALTA) 60 MG capsule    3. Tobacco use disorder  F17.200     4. Alcohol use disorder, mild, abuse  F10.10     5. Tardive dyskinesia  G24.01     6. Elevated blood pressure reading with diagnosis of hypertension  I10     7. Mild cognitive disorder Chronic F09       Past Psychiatric History: Reviewed past psychiatric history from progress note on 08/10/2017.  Past trials of medications- Effexor Wellbutrin Cymbalta Xanax Klonopin  Past Medical History:  Past Medical History:  Diagnosis Date   Cerebrovascular accident (CVA) ([facility]) 10/09/2011   Clotting disorder ([place])    DVT (deep venous thrombosis) ([facility])    Hearing deficit    History of ischemic vertebrobasilar artery thalamic stroke 2001   History of stroke    Hyperlipidemia    Hypertension    Patent foramen ovale    Right knee DJD    Stroke ([place]) 2001    Past Surgical History:  Procedure Laterality Date   APPENDECTOMY     FRACTURE SURGERY     R leg   INCISION AND DRAINAGE Right 06/30/2012   Procedure: INCISION AND DRAINAGE;  Surgeon: Lorn Junes, MD;  Location: [facility];  Service: Orthopedics;  Laterality: Right;   KNEE ARTHROSCOPY  right knee   KNEE BURSECTOMY Right 06/30/2012   Procedure: KNEE BURSECTOMY;  Surgeon: Lorn Junes, MD;  Location: [facility];  Service: Orthopedics;  Laterality: Right;  Pre-patellar with wound closure   TOTAL KNEE ARTHROPLASTY  01/26/2012   Procedure: TOTAL KNEE ARTHROPLASTY;  Surgeon: Lorn Junes, MD;  Location: [facility];  Service: Orthopedics;  Laterality: Right;    Family Psychiatric History: Reviewed family psychiatric history from progress note on 08/10/2017  Family History:  Family History  Problem Relation Age of Onset   Cancer Father    Seizures Brother     Social History: Reviewed social history from progress note on  08/10/2017 Social History   Socioeconomic History   Marital status: Married    Spouse name: debra   Number of children: 0   Years of education: 12   Highest education level: Master's degree (e.g., MA, MS, MEng, MEd, MSW, MBA)  Occupational History   Occupation: retired   Occupation: disabled @ age 32  Tobacco Use   Smoking status: Heavy Smoker    Packs/day: 2.00    Years: 49.00    Pack years: 98.00    Types: Cigarettes    Last attempt to quit: 06/13/2016    Years since quitting: 4.3   Smokeless tobacco: Never   Tobacco comments:    started smoking at age 56  Vaping Use   Vaping Use: Never used  Substance and Sexual Activity   Alcohol use: Yes    Alcohol/week: 28.0 - 42.0 standard drinks    Types: 28 - 42 Cans of beer per week    Comment: 4-6 beers a day   Drug use: No   Sexual activity: Not on file  Other Topics Concern   Not on file  Social History Narrative   Not on file   Social Determinants of Health   Financial Resource Strain: Low Risk    Difficulty of Paying Living Expenses: Not hard at all  Food Insecurity: No Food Insecurity   Worried About Charity fundraiser in the Last Year: Never true   Ran Out of Food in the Last Year: Never true  Transportation Needs: No Transportation Needs   Lack of Transportation (Medical): No   Lack of Transportation (Non-Medical): No  Physical Activity: Inactive   Days of Exercise per Week: 0 days   Minutes of Exercise per Session: 0 min  Stress: No Stress Concern Present   Feeling of Stress : Not at all  Social Connections: Moderately Isolated   Frequency of Communication with Friends and Family: Once a week   Frequency of Social Gatherings with Friends and Family: More than three times a week   Attends Religious Services: Never   Marine scientist or Organizations: No   Attends Archivist Meetings: Never   Marital Status: Married    Allergies:  Allergies  Allergen Reactions   Chantix [Varenicline  Tartrate]     confusion    Metabolic Disorder Labs: No results found for: HGBA1C, MPG No results found for: PROLACTIN Lab Results  Component Value Date   CHOL 178 07/04/2020   TRIG 47 07/04/2020   HDL 67 07/04/2020   CHOLHDL 2.7 07/04/2020   LDLCALC 102 (H) 07/04/2020   LDLCALC 82 06/17/2019   Lab Results  Component Value Date   TSH 2.25 03/13/2017    Therapeutic Level Labs: No results found for: LITHIUM No results found for: VALPROATE No components found for:  CBMZ  Current Medications: Current Outpatient  Medications  Medication Sig Dispense Refill   acetaminophen (TYLENOL) 325 MG tablet Take 2 tablets (650 mg total) by mouth every 6 (six) hours as needed for pain. 60 tablet 0   amLODipine-benazepril (LOTREL) 5-10 MG capsule TAKE 1 CAPSULE EVERY DAY (NEED MD APPOINTMENT) 90 capsule 1   amoxicillin (AMOXIL) 500 MG tablet Take by mouth as directed. 1 hour prior to dental procedures     chlorhexidine (PERIDEX) 0.12 % solution      fluticasone (FLONASE) 50 MCG/ACT nasal spray Place 1 spray into both nostrils as needed.      hydrochlorothiazide (HYDRODIURIL) 25 MG tablet Take 1 tablet (25 mg total) by mouth daily. 90 tablet 4   hydrOXYzine (VISTARIL) 25 MG capsule Take 1 capsule (25 mg total) by mouth daily as needed for anxiety (for sever panic sx). 90 capsule 1   montelukast (SINGULAIR) 10 MG tablet TAKE 1 TABLET DAILY FOR COUGH, NASAL DRAINAGE AND ALLERGIES 90 tablet 0   traMADol (ULTRAM) 50 MG tablet      warfarin (COUMADIN) 5 MG tablet TAKE 1 OR 2 TABLETS (5 OR 10MG ) DAILY AS DIRECTED BY PHYSICIAN 180 tablet 3   busPIRone (BUSPAR) 30 MG tablet Take 0.5 tablets (15 mg total) by mouth 2 (two) times daily. Start taking 15 mg daily in the AM and 15 mg daily PM - DOSE REDUCTION 90 tablet 1   DULoxetine (CYMBALTA) 60 MG capsule Take 1 capsule (60 mg total) by mouth daily. 90 capsule 1   No current facility-administered medications for this visit.      Musculoskeletal: Strength & Muscle Tone: within normal limits Gait & Station: normal Patient leans: N/A  Psychiatric Specialty Exam: Review of Systems  Psychiatric/Behavioral:  Negative for agitation, behavioral problems, confusion, decreased concentration, dysphoric mood, hallucinations, self-injury, sleep disturbance and suicidal ideas. The patient is not nervous/anxious and is not hyperactive.   All other systems reviewed and are negative.  Blood pressure (!) 170/91, pulse 66, temperature 98.1 F (36.7 C), temperature source Temporal, weight 210 lb 12.8 oz (95.6 kg).Body mass index is 30.25 kg/m.  General Appearance: Casual  Eye Contact:  Good  Speech:  Clear and Coherent  Volume:  Normal  Mood:  Euthymic  Affect:  Congruent  Thought Process:  Goal Directed and Descriptions of Associations: Intact  Orientation:  Other:  self, month, year  Thought Content: Logical   Suicidal Thoughts:  No  Homicidal Thoughts:  No  Memory:  Immediate;   Good Recent;   Good Remote;   Poor  Judgement:  Fair  Insight:  Fair  Psychomotor Activity:  Normal  Concentration:  Concentration: Fair and Attention Span: Fair  Recall:  [employer] of Knowledge: Fair  Language: Fair  Akathisia:  No  Handed:  Right  AIMS (if indicated): done  Assets:  Communication Skills Desire for [place] Talents/Skills Vocational/Educational Others:  access to health care  ADL's:  Intact  Cognition: Impaired,  Mild  Sleep:  Fair   Screenings: [facility] Office Visit from 10/16/2020 in [facility] Total Score 2      AUDIT    [facility] Office Visit from 07/04/2020 in [facility] from 06/19/2020 in [facility] from 04/25/2019 in [facility]  Alcohol Use Disorder Identification Test Final Score (AUDIT) 12 13 10       [facility] Office Visit from 10/16/2020 in  [facility] Video Visit from 07/23/2020 in [facility] Office Visit from 02/23/2019 in [facility]  Total GAD-7 Score 3 1 4       PHQ2-9    [facility] Office Visit from 10/16/2020 in [facility] Video Visit from 07/23/2020 in [facility] Office Visit from 07/04/2020 in [facility] from 06/19/2020 in [facility] Visit from 05/09/2020 in [facility]  PHQ-2 Total Score 0 1 0 0 1  PHQ-9 Total Score 0 -- 0 -- [facility] Office Visit from 10/16/2020 in [facility] No Risk        Assessment and Plan: LOURDES MANNING is a 70 year old Caucasian male who has a history of depression, panic attacks, cognitive impairment, alcohol use disorder, tobacco use disorder was evaluated in office today.  Patient with history of cognitive impairment, left-sided thalamic stroke is currently stable with regards to his mood.  Plan as noted below.  Plan Depressive disorder in remission Cymbalta 60 mg p.o. daily We will reduce BuSpar to 15 mg p.o. twice daily.  We will continue to taper it off gradually.   Panic attacks-stable BuSpar at reduced dosage as noted above. Hydroxyzine 25 mg daily as needed for severe anxiety attacks only.  Patient to limit use. Cymbalta 60 mg p.o. daily  Alcohol use disorder mild-unstable Patient continues to use 4 beers per day, 12 ounces. Provided counseling Not ready to cut back or quit  Tobacco use disorder-unstable  Not willing to quit Provided counseling  Elevated blood pressure reading-unstable Provided education. Patient reports he was not taking one of his blood pressure medications for a week and just restarted taking it yesterday.  Patient to  monitor his blood pressure closely and follow up with primary care provider.  Tardive dyskinesia-unstable Patient is not aware of his movements around his mouth. Will monitor closely He is not interested in medication management.  Collateral information obtained from wife.  Follow-up in clinic in 3 months or sooner as needed.  This note was generated in part or whole with voice recognition software. Voice recognition is usually quite accurate but there are transcription errors that can and very often do occur. I apologize for any typographical errors that were not detected and corrected.      Chase Alert, MD 10/17/2020, 6:19 PM

## 2020-11-02 ENCOUNTER — Other Ambulatory Visit: Payer: Self-pay | Admitting: Psychiatry

## 2020-11-02 DIAGNOSIS — F410 Panic disorder [episodic paroxysmal anxiety] without agoraphobia: Secondary | ICD-10-CM

## 2020-11-07 ENCOUNTER — Other Ambulatory Visit: Payer: Self-pay

## 2020-11-07 ENCOUNTER — Ambulatory Visit (INDEPENDENT_AMBULATORY_CARE_PROVIDER_SITE_OTHER): Payer: Medicare Other

## 2020-11-07 DIAGNOSIS — Z86718 Personal history of other venous thrombosis and embolism: Secondary | ICD-10-CM

## 2020-11-07 LAB — POCT INR
INR: 3.1 — AB (ref 2.0–3.0)
PT: 37.5

## 2020-11-07 NOTE — Patient Instructions (Signed)
Description   Hold for 2 days then resume 5 mg daily and 10 mg on M-W-F F/U 3 weeks

## 2020-11-13 ENCOUNTER — Ambulatory Visit (INDEPENDENT_AMBULATORY_CARE_PROVIDER_SITE_OTHER): Payer: Medicare Other | Admitting: Family Medicine

## 2020-11-13 ENCOUNTER — Other Ambulatory Visit: Payer: Self-pay

## 2020-11-13 ENCOUNTER — Encounter: Payer: Self-pay | Admitting: Family Medicine

## 2020-11-13 VITALS — BP 129/81 | HR 69 | Temp 98.1°F | Resp 18 | Wt 212.0 lb

## 2020-11-13 DIAGNOSIS — I6990 Unspecified sequelae of unspecified cerebrovascular disease: Secondary | ICD-10-CM

## 2020-11-13 DIAGNOSIS — F320 Major depressive disorder, single episode, mild: Secondary | ICD-10-CM

## 2020-11-13 DIAGNOSIS — F1020 Alcohol dependence, uncomplicated: Secondary | ICD-10-CM | POA: Insufficient documentation

## 2020-11-13 DIAGNOSIS — I10 Essential (primary) hypertension: Secondary | ICD-10-CM

## 2020-11-13 NOTE — Progress Notes (Signed)
Established patient visit   [name]   DOB: 16-Jan-1951   70 y.o. Male  MRN: [id number] Visit Date: 11/13/2020  Today's healthcare provider: Lelon Huh, MD   Chief Complaint  Patient presents with   Hypertension   Subjective    HPI  Hypertension, follow-up  BP Readings from Last 3 Encounters:  11/13/20 129/81  09/10/20 (!) 157/84  08/06/20 136/76   Wt Readings from Last 3 Encounters:  11/13/20 212 lb (96.2 kg)  09/10/20 208 lb (94.3 kg)  08/06/20 211 lb (95.7 kg)     He was last seen for hypertension 4 months ago.  BP at that visit was 131/78. Management since that visit includes continue same medication.  He reports good compliance with treatment. He is not having side effects.  He is following a Regular diet. He is exercising. He does smoke.  Use of agents associated with hypertension: none.   Outside blood pressures are not checked. Symptoms: No chest pain No chest pressure  No palpitations No syncope  No dyspnea No orthopnea  No paroxysmal nocturnal dyspnea No lower extremity edema   Pertinent labs: Lab Results  Component Value Date   CHOL 178 07/04/2020   HDL 67 07/04/2020   LDLCALC 102 (H) 07/04/2020   TRIG 47 07/04/2020   CHOLHDL 2.7 07/04/2020   Lab Results  Component Value Date   NA 135 07/04/2020   K 5.2 07/04/2020   CREATININE 1.05 07/04/2020   GFRNONAA 66 06/17/2019   GFRAA 77 06/17/2019   GLUCOSE 106 (H) 07/04/2020     The 10-year ASCVD risk score Mikey Bussing DC Jr., et al., 2013) is: 21.3%   ---------------------------------------------------------------------------------------------------      Medications: Outpatient Medications Prior to Visit  Medication Sig   acetaminophen (TYLENOL) 325 MG tablet Take 2 tablets (650 mg total) by mouth every 6 (six) hours as needed for pain.   amLODipine-benazepril (LOTREL) 5-10 MG capsule TAKE 1 CAPSULE EVERY DAY (NEED MD APPOINTMENT)   busPIRone (BUSPAR) 30 MG tablet Take  0.5 tablets (15 mg total) by mouth 2 (two) times daily. Start taking 15 mg daily in the AM and 15 mg daily PM - DOSE REDUCTION   DULoxetine (CYMBALTA) 60 MG capsule Take 1 capsule (60 mg total) by mouth daily.   fluticasone (FLONASE) 50 MCG/ACT nasal spray Place 1 spray into both nostrils as needed.    hydrochlorothiazide (HYDRODIURIL) 25 MG tablet Take 1 tablet (25 mg total) by mouth daily.   hydrOXYzine (VISTARIL) 25 MG capsule Take 1 capsule (25 mg total) by mouth daily as needed for anxiety (for sever panic sx).   montelukast (SINGULAIR) 10 MG tablet TAKE 1 TABLET DAILY FOR COUGH, NASAL DRAINAGE AND ALLERGIES   traMADol (ULTRAM) 50 MG tablet    warfarin (COUMADIN) 5 MG tablet TAKE 1 OR 2 TABLETS (5 OR 10MG ) DAILY AS DIRECTED BY PHYSICIAN   amoxicillin (AMOXIL) 500 MG tablet Take by mouth as directed. 1 hour prior to dental procedures (Patient not taking: Reported on 11/13/2020)   chlorhexidine (PERIDEX) 0.12 % solution  (Patient not taking: Reported on 11/13/2020)   No facility-administered medications prior to visit.    Review of Systems  Constitutional:  Negative for appetite change, chills and fever.  Respiratory:  Negative for chest tightness, shortness of breath and wheezing.   Cardiovascular:  Negative for chest pain and palpitations.  Gastrointestinal:  Negative for abdominal pain, nausea and vomiting.      Objective    BP  129/81 (BP Location: Left Arm, Patient Position: Sitting, Cuff Size: Large)   Pulse 69   Temp 98.1 F (36.7 C) (Temporal)   Resp 18   Wt 212 lb (96.2 kg)   BMI 30.42 kg/m     Physical Exam   General: Appearance:    Mildly obese male in no acute distress  Eyes:    PERRL, conjunctiva/corneas clear, EOM's intact       Lungs:     Clear to auscultation bilaterally, respirations unlabored  Heart:    Normal heart rate. Normal rhythm. No murmurs, rubs, or gallops.    MS:   All extremities are intact.    Neurologic:   Awake, alert, oriented x 3. No apparent  focal neurological defect.         Assessment & Plan     1. Primary hypertension Very well controlled. Continue current medications.  Follow up in February 2023  2. MDD (major depressive disorder), single episode, mild ([place]) Doing very well on duloxetine. Continue current medications.    3. Cerebrovascular accident, late effects Stable for many years. On life long anticoagulation.         The entirety of the information documented in the History of Present Illness, Review of Systems and Physical Exam were personally obtained by me. Portions of this information were initially documented by the CMA and reviewed by me for thoroughness and accuracy.     Lelon Huh, MD  [facility] [phone] (phone) [phone] (fax)  [place]

## 2020-11-27 ENCOUNTER — Other Ambulatory Visit: Payer: Self-pay | Admitting: Family Medicine

## 2020-11-27 DIAGNOSIS — I10 Essential (primary) hypertension: Secondary | ICD-10-CM

## 2020-11-27 MED ORDER — AMLODIPINE BESY-BENAZEPRIL HCL 5-10 MG PO CAPS: 1.0000 | ORAL_CAPSULE | Freq: Every day | ORAL | 4 refills | Status: DC

## 2020-11-28 ENCOUNTER — Ambulatory Visit (INDEPENDENT_AMBULATORY_CARE_PROVIDER_SITE_OTHER): Payer: Medicare Other

## 2020-11-28 ENCOUNTER — Other Ambulatory Visit: Payer: Self-pay

## 2020-11-28 DIAGNOSIS — Z86718 Personal history of other venous thrombosis and embolism: Secondary | ICD-10-CM | POA: Diagnosis not present

## 2020-11-28 LAB — POCT INR
INR: 2.8 (ref 2.0–3.0)
PT: 33.2

## 2020-11-28 NOTE — Patient Instructions (Signed)
Description   5 mg daily and 10 mg on M-W-F F/U 4 weeks.

## 2020-12-24 DIAGNOSIS — Z23 Encounter for immunization: Secondary | ICD-10-CM | POA: Diagnosis not present

## 2020-12-26 ENCOUNTER — Other Ambulatory Visit: Payer: Self-pay

## 2020-12-26 ENCOUNTER — Ambulatory Visit (INDEPENDENT_AMBULATORY_CARE_PROVIDER_SITE_OTHER): Payer: Medicare Other

## 2020-12-26 DIAGNOSIS — Z86718 Personal history of other venous thrombosis and embolism: Secondary | ICD-10-CM

## 2020-12-26 LAB — POCT INR
INR: 2.5 (ref 2.0–3.0)
PT: 30.5

## 2020-12-26 NOTE — Patient Instructions (Signed)
Description   5 mg daily and 10 mg on M-W-F F/U 4 weeks.

## 2021-01-21 ENCOUNTER — Telehealth (INDEPENDENT_AMBULATORY_CARE_PROVIDER_SITE_OTHER): Payer: Medicare Other | Admitting: Psychiatry

## 2021-01-21 ENCOUNTER — Other Ambulatory Visit: Payer: Self-pay

## 2021-01-21 ENCOUNTER — Encounter: Payer: Self-pay | Admitting: Psychiatry

## 2021-01-21 DIAGNOSIS — F17200 Nicotine dependence, unspecified, uncomplicated: Secondary | ICD-10-CM

## 2021-01-21 DIAGNOSIS — F1010 Alcohol abuse, uncomplicated: Secondary | ICD-10-CM | POA: Diagnosis not present

## 2021-01-21 DIAGNOSIS — F09 Unspecified mental disorder due to known physiological condition: Secondary | ICD-10-CM

## 2021-01-21 DIAGNOSIS — G2401 Drug induced subacute dyskinesia: Secondary | ICD-10-CM

## 2021-01-21 DIAGNOSIS — F418 Other specified anxiety disorders: Secondary | ICD-10-CM

## 2021-01-21 DIAGNOSIS — F0631 Mood disorder due to known physiological condition with depressive features: Secondary | ICD-10-CM | POA: Diagnosis not present

## 2021-01-21 MED ORDER — BUSPIRONE HCL 15 MG PO TABS
15.0000 mg | ORAL_TABLET | Freq: Two times a day (BID) | ORAL | 1 refills | Status: DC
Start: 2021-01-21 — End: 2021-05-20

## 2021-01-21 NOTE — Progress Notes (Signed)
Virtual Visit via Video Note  I connected with Chase [name] on 01/21/21 at  2:00 PM EDT by a video enabled telemedicine application and verified that I am speaking with the correct person using two identifiers.  Location Provider Location : [facility] Patient Location : Home  Participants: Patient ,Spouse, Provider    I discussed the limitations of evaluation and management by telemedicine and the availability of in person appointments. The patient expressed understanding and agreed to proceed.   I discussed the assessment and treatment plan with the patient. The patient was provided an opportunity to ask questions and all were answered. The patient agreed with the plan and demonstrated an understanding of the instructions.   The patient was advised to call back or seek an in-person evaluation if the symptoms worsen or if the condition fails to improve as anticipated.   [facility] MD OP Progress Note  01/21/2021 2:22 PM Chase [name]  MRN:  [id number]  Chief Complaint:  Chief Complaint   Follow-up; Depression; Anxiety    HPI: Chase [name] is a 70 year old Caucasian male who has a history of depression, panic attacks, cognitive disorder, alcohol use disorder, tobacco use disorder was evaluated by telemedicine today.  Patient as well as spouse participated in evaluation today patient being a limited historian majority of information was obtained from spouse.  Patient reports he is currently doing well.  He does have anxiety attacks that last for 5 to 10 minutes on a daily basis however he is able to distract himself, use coping techniques and that gets better.  He reports since being on the lower dosage of BuSpar he has not noticed any worsening anxiety symptoms or depression.  He reports sleep is overall okay.  Denies suicidality, homicidality or perceptual disturbances.  Patient reports he is currently trying to cut back on his alcohol use.  He is not interested in cutting back on  smoking.  Patient with elevated blood pressure reading last visit reports he was noncompliant on one of his blood pressure medications since his wife had forgotten to put it in the pillbox.  However they were able to figure that out and his blood pressure at his primary care provider's office in July was back to normal since starting back on his antihypertensive medication.  Patient denies any other concerns.  According to spouse patient is currently doing well and is tolerating the lower dosage of BuSpar well.  Visit Diagnosis:    ICD-10-CM   1. 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 Other specified anxiety disorders  F41.8 busPIRone (BUSPAR) 15 MG tablet   with limited symptom attacks    3. Tobacco use disorder  F17.200     4. Alcohol use disorder, mild, abuse  F10.10     5. Tardive dyskinesia  G24.01     6. Mild cognitive disorder  F09       Past Psychiatric History: I have reviewed past psychiatric history from progress note on 08/10/2017.  Past trials of medications-Effexor, Wellbutrin, Cymbalta, Xanax, Klonopin  Past Medical History:  Past Medical History:  Diagnosis Date   Cerebrovascular accident (CVA) ([place]) 10/09/2011   Clotting disorder ([place])    DVT (deep venous thrombosis) ([place])    Hearing deficit    History of ischemic vertebrobasilar artery thalamic stroke 2001   History of stroke    Hyperlipidemia    Hypertension    Patent foramen ovale  Right knee DJD    Stroke [facility]) 2001    Past Surgical History:  Procedure Laterality Date   APPENDECTOMY     FRACTURE SURGERY     R leg   INCISION AND DRAINAGE Right 06/30/2012   Procedure: INCISION AND DRAINAGE;  Surgeon: Lorn Junes, MD;  Location: [facility];  Service: Orthopedics;  Laterality: Right;   KNEE ARTHROSCOPY     right knee   KNEE BURSECTOMY Right 06/30/2012   Procedure: KNEE BURSECTOMY;  Surgeon: Lorn Junes, MD;  Location: [facility];   Service: Orthopedics;  Laterality: Right;  Pre-patellar with wound closure   TOTAL KNEE ARTHROPLASTY  01/26/2012   Procedure: TOTAL KNEE ARTHROPLASTY;  Surgeon: Lorn Junes, MD;  Location: [facility];  Service: Orthopedics;  Laterality: Right;    Family Psychiatric History: Reviewed family psychiatric history from progress note on 08/10/2017  Family History:  Family History  Problem Relation Age of Onset   Cancer Father    Seizures Brother     Social History: Reviewed social history from progress note on 08/10/2017 Social History   Socioeconomic History   Marital status: Married    Spouse name: Chase [name]   Number of children: 0   Years of education: 12   Highest education level: Master's degree (e.g., MA, MS, MEng, MEd, MSW, MBA)  Occupational History   Occupation: retired   Occupation: disabled @ age 70  Tobacco Use   Smoking status: Heavy Smoker    Packs/day: 2.00    Years: 49.00    Pack years: 98.00    Types: Cigarettes    Last attempt to quit: 06/13/2016    Years since quitting: 4.6   Smokeless tobacco: Never   Tobacco comments:    started smoking at age 45  Vaping Use   Vaping Use: Never used  Substance and Sexual Activity   Alcohol use: Yes    Alcohol/week: 28.0 - 42.0 standard drinks    Types: 28 - 42 Cans of beer per week    Comment: 4-6 beers a day   Drug use: No   Sexual activity: Not on file  Other Topics Concern   Not on file  Social History Narrative   Not on file   Social Determinants of Health   Financial Resource Strain: Low Risk    Difficulty of Paying Living Expenses: Not hard at all  Food Insecurity: No Food Insecurity   Worried About Charity fundraiser in the Last Year: Never true   Ran Out of Food in the Last Year: Never true  Transportation Needs: No Transportation Needs   Lack of Transportation (Medical): No   Lack of Transportation (Non-Medical): No  Physical Activity: Inactive   Days of Exercise per Week: 0 days   Minutes of Exercise per  Session: 0 min  Stress: No Stress Concern Present   Feeling of Stress : Not at all  Social Connections: Moderately Isolated   Frequency of Communication with Friends and Family: Once a week   Frequency of Social Gatherings with Friends and Family: More than three times a week   Attends Religious Services: Never   Marine scientist or Organizations: No   Attends Archivist Meetings: Never   Marital Status: Married    Allergies:  Allergies  Allergen Reactions   Chantix [Varenicline Tartrate]     confusion    Metabolic Disorder Labs: No results found for: HGBA1C, MPG No results found for: PROLACTIN Lab Results  Component Value Date  CHOL 178 07/04/2020   TRIG 47 07/04/2020   HDL 67 07/04/2020   CHOLHDL 2.7 07/04/2020   LDLCALC 102 (H) 07/04/2020   LDLCALC 82 06/17/2019   Lab Results  Component Value Date   TSH 2.25 03/13/2017    Therapeutic Level Labs: No results found for: LITHIUM No results found for: VALPROATE No components found for:  CBMZ  Current Medications: Current Outpatient Medications  Medication Sig Dispense Refill   busPIRone (BUSPAR) 15 MG tablet Take 1 tablet (15 mg total) by mouth 2 (two) times daily. 180 tablet 1   acetaminophen (TYLENOL) 325 MG tablet Take 2 tablets (650 mg total) by mouth every 6 (six) hours as needed for pain. 60 tablet 0   amLODipine-benazepril (LOTREL) 5-10 MG capsule Take 1 capsule by mouth daily. 90 capsule 4   amoxicillin (AMOXIL) 500 MG tablet Take by mouth as directed. 1 hour prior to dental procedures (Patient not taking: Reported on 11/13/2020)     DULoxetine (CYMBALTA) 60 MG capsule Take 1 capsule (60 mg total) by mouth daily. 90 capsule 1   fluticasone (FLONASE) 50 MCG/ACT nasal spray Place 1 spray into both nostrils as needed.      hydrochlorothiazide (HYDRODIURIL) 25 MG tablet Take 1 tablet (25 mg total) by mouth daily. 90 tablet 4   hydrOXYzine (VISTARIL) 25 MG capsule Take 1 capsule (25 mg total) by  mouth daily as needed for anxiety (for sever panic sx). 90 capsule 1   montelukast (SINGULAIR) 10 MG tablet TAKE 1 TABLET DAILY FOR COUGH, NASAL DRAINAGE AND ALLERGIES 90 tablet 0   traMADol (ULTRAM) 50 MG tablet      warfarin (COUMADIN) 5 MG tablet TAKE 1 OR 2 TABLETS (5 OR 10MG ) DAILY AS DIRECTED BY PHYSICIAN 180 tablet 3   No current facility-administered medications for this visit.     Musculoskeletal: Strength & Muscle Tone:  UTA Gait & Station: normal Patient leans: N/A  Psychiatric Specialty Exam: Review of Systems  Psychiatric/Behavioral:  Negative for agitation, behavioral problems, confusion, decreased concentration, dysphoric mood, hallucinations, self-injury, sleep disturbance and suicidal ideas. The patient is nervous/anxious. The patient is not hyperactive.   All other systems reviewed and are negative.  There were no vitals taken for this visit.There is no height or weight on file to calculate BMI.  General Appearance: Casual  Eye Contact:  Fair  Speech:  Clear and Coherent  Volume:  Normal  Mood:  Anxious  Affect:  Congruent  Thought Process:  Goal Directed and Descriptions of Associations: Intact  Orientation:  Full (Time, Place, and Person)  Thought Content: Logical   Suicidal Thoughts:  No  Homicidal Thoughts:  No  Memory:  Immediate;   Fair Recent;   Limited Remote;   Limited  Judgement:  Fair  Insight:  Fair  Psychomotor Activity:  Normal  Concentration:  Concentration: Fair and Attention Span: Fair  Recall:  [employer] of Knowledge: Fair  Language: Fair  Akathisia:  No  Handed:  Right  AIMS (if indicated): done  Assets:  Communication Skills Desire for Improvement Housing Social Support  ADL's:  Intact  Cognition: baseline  Sleep:  Fair   Screenings: AIMS    Flowsheet Row Video Visit from 01/21/2021 in [place] Office Visit from 10/16/2020 in [place] Total Score 2 2       AUDIT    [place] Office Visit from 07/04/2020 in [place] from 06/19/2020 in [facility]  Clinical Support from 04/25/2019 in [facility]  Alcohol Use Disorder Identification Test Final Score (AUDIT) 12 13 10       GAD-7    Flowsheet Row Office Visit from 10/16/2020 in [place] Video Visit from 07/23/2020 in [place] Office Visit from 02/23/2019 in [place]  Total GAD-7 Score 3 1 4       PHQ2-9    [place] Office Visit from 10/16/2020 in [place] Video Visit from 07/23/2020 in [place] Office Visit from 07/04/2020 in [place] from 06/19/2020 in [place] Visit from 05/09/2020 in [facility]  PHQ-2 Total Score 0 1 0 0 1  PHQ-9 Total Score 0 -- 0 -- [place] Office Visit from 10/16/2020 in [place] No Risk        Assessment and Plan: VEDH PTACEK is a 70 year old Caucasian male who has a history of depression, panic attacks, cognitive impairment, alcohol use disorder, tobacco use disorder was evaluated by telemedicine today.  Patient with history of cognitive impairment, left-sided thalamic stroke, currently tolerating the lower dosage of BuSpar.  He does have good social support system from his wife.  Discussed plan as noted below.  Plan Depressive disorder in remission Cymbalta 60 mg p.o. daily BuSpar at reduced dose to 15 mg p.o. twice daily AIMS - 2  Other specified anxiety disorder with limited symptom attacks-stable BuSpar as prescribed Cymbalta 60 mg p.o. daily.  Alcohol use disorder mild -unstable Patient is currently trying to cut back  Tobacco use disorder-unstable Provided counseling for 1  minute.  Tardive dyskinesia-improving We will monitor closely  Collateral information obtained from wife.  As noted above.   History of elevated blood pressure reading-reviewed notes per primary care provider-Dr. Caryn Section dated 11/13/2020-blood pressure reading that day was 129/81-within normal limits.  Follow-up in clinic in 4 to 5 months or sooner if needed.  This note was generated in part or whole with voice recognition software. Voice recognition is usually quite accurate but there are transcription errors that can and very often do occur. I apologize for any typographical errors that were not detected and corrected.      Ursula Alert, MD 01/21/2021, 2:22 PM

## 2021-01-23 ENCOUNTER — Ambulatory Visit: Payer: Medicare Other

## 2021-01-23 ENCOUNTER — Telehealth: Payer: Self-pay

## 2021-01-23 ENCOUNTER — Other Ambulatory Visit: Payer: Self-pay

## 2021-01-23 DIAGNOSIS — H43313 Vitreous membranes and strands, bilateral: Secondary | ICD-10-CM | POA: Diagnosis not present

## 2021-01-23 LAB — POCT INR: INR: 2.5 (ref 2.0–3.0)

## 2021-01-23 MED ORDER — WARFARIN SODIUM 5 MG PO TABS: ORAL_TABLET | ORAL | 1 refills | Status: DC

## 2021-01-23 NOTE — Telephone Encounter (Signed)
Patient needs refills of Warfarin 5 mg send to [facility]

## 2021-01-23 NOTE — Patient Instructions (Signed)
Description   5 mg daily and 10 mg on M-W-F F/U 4 weeks.

## 2021-01-30 ENCOUNTER — Other Ambulatory Visit: Payer: Self-pay | Admitting: Family Medicine

## 2021-01-30 DIAGNOSIS — J301 Allergic rhinitis due to pollen: Secondary | ICD-10-CM

## 2021-02-20 ENCOUNTER — Other Ambulatory Visit: Payer: Self-pay

## 2021-02-20 ENCOUNTER — Ambulatory Visit (INDEPENDENT_AMBULATORY_CARE_PROVIDER_SITE_OTHER): Payer: Medicare Other

## 2021-02-20 DIAGNOSIS — Z23 Encounter for immunization: Secondary | ICD-10-CM

## 2021-02-20 LAB — POCT INR: INR: 2.4 (ref 2.0–3.0)

## 2021-02-20 NOTE — Patient Instructions (Signed)
Description   5 mg daily and 10 mg on M-W-F F/U 4 weeks.

## 2021-03-20 ENCOUNTER — Ambulatory Visit: Payer: Medicare Other

## 2021-03-27 ENCOUNTER — Other Ambulatory Visit: Payer: Self-pay

## 2021-03-27 ENCOUNTER — Ambulatory Visit (INDEPENDENT_AMBULATORY_CARE_PROVIDER_SITE_OTHER): Payer: Medicare Other

## 2021-03-27 DIAGNOSIS — Z86718 Personal history of other venous thrombosis and embolism: Secondary | ICD-10-CM

## 2021-03-27 LAB — POCT INR
INR: 2.4 (ref 2.0–3.0)
PT: 29.2

## 2021-03-27 NOTE — Patient Instructions (Signed)
Description   5 mg daily and 10 mg on M-W-F F/U 4 weeks.

## 2021-04-09 ENCOUNTER — Other Ambulatory Visit: Payer: Self-pay | Admitting: Psychiatry

## 2021-04-09 DIAGNOSIS — F410 Panic disorder [episodic paroxysmal anxiety] without agoraphobia: Secondary | ICD-10-CM

## 2021-04-24 ENCOUNTER — Other Ambulatory Visit: Payer: Self-pay

## 2021-04-24 ENCOUNTER — Ambulatory Visit (INDEPENDENT_AMBULATORY_CARE_PROVIDER_SITE_OTHER): Payer: Medicare Other

## 2021-04-24 DIAGNOSIS — Z86718 Personal history of other venous thrombosis and embolism: Secondary | ICD-10-CM

## 2021-04-24 LAB — POCT INR
INR: 2.6 (ref 2.0–3.0)
Prothrombin Time: 31.4

## 2021-04-24 NOTE — Patient Instructions (Signed)
Description    No changes. 5 mg daily and 10 mg on M-W-F F/U 4 weeks.

## 2021-05-03 ENCOUNTER — Telehealth: Payer: Medicare Other | Admitting: Psychiatry

## 2021-05-20 ENCOUNTER — Other Ambulatory Visit: Payer: Self-pay

## 2021-05-20 ENCOUNTER — Encounter: Payer: Self-pay | Admitting: Psychiatry

## 2021-05-20 ENCOUNTER — Telehealth (INDEPENDENT_AMBULATORY_CARE_PROVIDER_SITE_OTHER): Payer: Medicare Other | Admitting: Psychiatry

## 2021-05-20 DIAGNOSIS — F1010 Alcohol abuse, uncomplicated: Secondary | ICD-10-CM | POA: Diagnosis not present

## 2021-05-20 DIAGNOSIS — F17200 Nicotine dependence, unspecified, uncomplicated: Secondary | ICD-10-CM | POA: Diagnosis not present

## 2021-05-20 DIAGNOSIS — F418 Other specified anxiety disorders: Secondary | ICD-10-CM | POA: Diagnosis not present

## 2021-05-20 DIAGNOSIS — F0631 Mood disorder due to known physiological condition with depressive features: Secondary | ICD-10-CM

## 2021-05-20 DIAGNOSIS — G2401 Drug induced subacute dyskinesia: Secondary | ICD-10-CM

## 2021-05-20 DIAGNOSIS — F09 Unspecified mental disorder due to known physiological condition: Secondary | ICD-10-CM

## 2021-05-20 MED ORDER — BUSPIRONE HCL 15 MG PO TABS: 7.5000 mg | ORAL_TABLET | Freq: Two times a day (BID) | ORAL | 1 refills | Status: DC

## 2021-05-20 MED ORDER — HYDROXYZINE PAMOATE 25 MG PO CAPS: 25.0000 mg | ORAL_CAPSULE | Freq: Every day | ORAL | 1 refills | Status: DC | PRN

## 2021-05-20 NOTE — Progress Notes (Signed)
Virtual Visit via Video Note  I connected with Chase [name] on 05/20/21 at  9:00 AM EST by a video enabled telemedicine application and verified that I am speaking with the correct person using two identifiers.  Location Provider Location : [facility] Patient Location : Home  Participants: Patient , Spouse,Provider    I discussed the limitations of evaluation and management by telemedicine and the availability of in person appointments. The patient expressed understanding and agreed to proceed.    I discussed the assessment and treatment plan with the patient. The patient was provided an opportunity to ask questions and all were answered. The patient agreed with the plan and demonstrated an understanding of the instructions.   The patient was advised to call back or seek an in-person evaluation if the symptoms worsen or if the condition fails to improve as anticipated.    [facility] MD OP Progress Note  05/20/2021 5:13 PM ADRIANE [name]  MRN:  [id number]  Chief Complaint:  Chief Complaint   Follow-up; 71 year old Caucasian male with history of depression, panic attacks, cognitive disorder, alcohol and tobacco use disorder presented for a follow-up for medication management.    HPI: Chase [name] is a 71 year old Caucasian male who has a history of depression, panic attacks, cognitive disorder, alcohol use disorder, tobacco use disorder was evaluated by telemedicine today.  Patient being a limited historian, his spouse provided collateral information.  Patient reports holidays were stressful however he currently feels much better.  He does have anxiety symptoms, and often uses hydroxyzine as needed a couple of times a month only.  That does help him.  Patient reports he does have mild anxiety on and off however it is more manageable and has not had any recent panic attacks.  Patient reports sleep is overall okay.  Denies any appetite changes.  Has been able to cut back on his smoking  cigarettes.  He is not willing to cut back on alcohol.  Denies suicidality, homicidality or perceptual disturbances.  Keeps himself busy doing projects all day and that helps.  According to wife patient is currently doing fairly well.  Patient as well as wife agreeable to reducing the dosage of his BuSpar since he is currently stable.  Denies any other concerns today.  Visit Diagnosis:    ICD-10-CM   1. 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 Other specified anxiety disorders  F41.8 busPIRone (BUSPAR) 15 MG tablet   with limited symptom attacks    3. Tobacco use disorder  F17.200 hydrOXYzine (VISTARIL) 25 MG capsule    4. Alcohol use disorder, mild, abuse  F10.10     5. Tardive dyskinesia  G24.01     6. Mild cognitive disorder  F09       Past Psychiatric History: Reviewed past psychiatric history from progress note on 08/10/2017.  Past trials of medications-Effexor, Wellbutrin, Cymbalta, Xanax, Klonopin  Past Medical History:  Past Medical History:  Diagnosis Date   Cerebrovascular accident (CVA) ([place]) 10/09/2011   Clotting disorder ([place])    DVT (deep venous thrombosis) ([facility])    Hearing deficit    History of ischemic vertebrobasilar artery thalamic stroke 2001   History of stroke    Hyperlipidemia    Hypertension    Patent foramen ovale    Right knee DJD    Stroke ([place]) 2001    Past Surgical History:  Procedure Laterality Date   APPENDECTOMY  FRACTURE SURGERY     R leg   INCISION AND DRAINAGE Right 06/30/2012   Procedure: INCISION AND DRAINAGE;  Surgeon: Lorn Junes, MD;  Location: [facility];  Service: Orthopedics;  Laterality: Right;   KNEE ARTHROSCOPY     right knee   KNEE BURSECTOMY Right 06/30/2012   Procedure: KNEE BURSECTOMY;  Surgeon: Lorn Junes, MD;  Location: [facility];  Service: Orthopedics;  Laterality: Right;  Pre-patellar with wound closure   TOTAL KNEE ARTHROPLASTY  01/26/2012    Procedure: TOTAL KNEE ARTHROPLASTY;  Surgeon: Lorn Junes, MD;  Location: [facility];  Service: Orthopedics;  Laterality: Right;    Family Psychiatric History: Reviewed family psychiatric history from progress note on 08/10/2017  Family History:  Family History  Problem Relation Age of Onset   Cancer Father    Seizures Brother     Social History: Reviewed social history from progress note on 08/10/2017 Social History   Socioeconomic History   Marital status: Married    Spouse name: debra   Number of children: 0   Years of education: 12   Highest education level: Master's degree (e.g., MA, MS, MEng, MEd, MSW, MBA)  Occupational History   Occupation: retired   Occupation: disabled @ age 62  Tobacco Use   Smoking status: Heavy Smoker    Packs/day: 2.00    Years: 49.00    Pack years: 98.00    Types: Cigarettes    Last attempt to quit: 06/13/2016    Years since quitting: 4.9   Smokeless tobacco: Never   Tobacco comments:    started smoking at age 73  Vaping Use   Vaping Use: Never used  Substance and Sexual Activity   Alcohol use: Yes    Alcohol/week: 28.0 - 42.0 standard drinks    Types: 28 - 42 Cans of beer per week    Comment: 4-6 beers a day   Drug use: No   Sexual activity: Not on file  Other Topics Concern   Not on file  Social History Narrative   Not on file   Social Determinants of Health   Financial Resource Strain: Low Risk    Difficulty of Paying Living Expenses: Not hard at all  Food Insecurity: No Food Insecurity   Worried About Charity fundraiser in the Last Year: Never true   Ran Out of Food in the Last Year: Never true  Transportation Needs: No Transportation Needs   Lack of Transportation (Medical): No   Lack of Transportation (Non-Medical): No  Physical Activity: Inactive   Days of Exercise per Week: 0 days   Minutes of Exercise per Session: 0 min  Stress: No Stress Concern Present   Feeling of Stress : Not at all  Social Connections:  Moderately Isolated   Frequency of Communication with Friends and Family: Once a week   Frequency of Social Gatherings with Friends and Family: More than three times a week   Attends Religious Services: Never   Marine scientist or Organizations: No   Attends Archivist Meetings: Never   Marital Status: Married    Allergies:  Allergies  Allergen Reactions   Chantix [Varenicline Tartrate]     confusion    Metabolic Disorder Labs: No results found for: HGBA1C, MPG No results found for: PROLACTIN Lab Results  Component Value Date   CHOL 178 07/04/2020   TRIG 47 07/04/2020   HDL 67 07/04/2020   CHOLHDL 2.7 07/04/2020   LDLCALC 102 (H) 07/04/2020  [place] 82 06/17/2019   Lab Results  Component Value Date   TSH 2.25 03/13/2017    Therapeutic Level Labs: No results found for: LITHIUM No results found for: VALPROATE No components found for:  CBMZ  Current Medications: Current Outpatient Medications  Medication Sig Dispense Refill   acetaminophen (TYLENOL) 325 MG tablet Take 2 tablets (650 mg total) by mouth every 6 (six) hours as needed for pain. 60 tablet 0   amLODipine-benazepril (LOTREL) 5-10 MG capsule Take 1 capsule by mouth daily. 90 capsule 4   amoxicillin (AMOXIL) 500 MG tablet Take by mouth as directed. 1 hour prior to dental procedures (Patient not taking: Reported on 11/13/2020)     busPIRone (BUSPAR) 15 MG tablet Take 0.5 tablets (7.5 mg total) by mouth 2 (two) times daily. 90 tablet 1   DULoxetine (CYMBALTA) 60 MG capsule TAKE 1 CAPSULE BY MOUTH EVERY DAY 90 capsule 1   fluticasone (FLONASE) 50 MCG/ACT nasal spray Place 1 spray into both nostrils as needed.      hydrochlorothiazide (HYDRODIURIL) 25 MG tablet Take 1 tablet (25 mg total) by mouth daily. 90 tablet 4   hydrOXYzine (VISTARIL) 25 MG capsule Take 1 capsule (25 mg total) by mouth daily as needed for anxiety (for sever panic sx). 90 capsule 1   montelukast (SINGULAIR) 10 MG tablet TAKE 1  TABLET DAILY FOR COUGH, NASAL DRAINAGE AND ALLERGIES 90 tablet 3   traMADol (ULTRAM) 50 MG tablet      warfarin (COUMADIN) 5 MG tablet TAKE 1 OR 2 TABLETS (5 OR 10MG ) DAILY AS DIRECTED BY PHYSICIAN 180 tablet 1   No current facility-administered medications for this visit.     Musculoskeletal: Strength & Muscle Tone:  UTA Gait & Station:  Seated Patient leans: N/A  Psychiatric Specialty Exam: Review of Systems  Psychiatric/Behavioral:  The patient is nervous/anxious.   All other systems reviewed and are negative.  There were no vitals taken for this visit.There is no height or weight on file to calculate BMI.  General Appearance: Casual  Eye Contact:  Fair  Speech:  Clear and Coherent  Volume:  Normal  Mood:  Anxious  Affect:  Congruent  Thought Process:  Goal Directed and Descriptions of Associations: Intact  Orientation:  Other:  place, self, month, year  Thought Content: Logical   Suicidal Thoughts:  No  Homicidal Thoughts:  No  Memory:  Immediate;   Fair Recent;   Fair Remote;   limited  Judgement:  Fair  Insight:  Fair  Psychomotor Activity:  Normal  Concentration:  Concentration: Fair and Attention Span: Fair  Recall:  [employer] of Knowledge: Fair  Language: Fair  Akathisia:  No  Handed:  Right  AIMS (if indicated): done  Assets:  Communication Skills Desire for Improvement Housing Intimacy Social Support Transportation  ADL's:  Intact  Cognition: Impaired,  Mild chronic, baseline  Sleep:  Fair   Screenings: AIMS    Flowsheet Row Video Visit from 05/20/2021 in [place] Video Visit from 01/21/2021 in [place] Office Visit from 10/16/2020 in [place] Total Score 2 2 2       AUDIT    [place] Visit from 07/04/2020 in [place] from 06/19/2020 in [place] from [facility]/25/2019 in  [facility]  Alcohol Use Disorder Identification Test Final Score (AUDIT) 12 13 10       GAD-7    Flowsheet Row Video  Visit from 05/20/2021 in [place] Visit from 10/16/2020 in [place] Video Visit from 07/23/2020 in [place] Office Visit from 02/23/2019 in [place]  Total GAD-7 Score 4 3 1 4       PHQ2-9    Flowsheet Row Video Visit from 05/20/2021 in [place] Office Visit from 10/16/2020 in [place] Video Visit from 07/23/2020 in [place] Office Visit from 07/04/2020 in [place] from 06/19/2020 in [place]  PHQ-2 Total Score 0 0 1 0 0  PHQ-9 Total Score -- 0 -- 0 --      [place] Office Visit from 10/16/2020 in [place] No Risk        Assessment and Plan: DYMOND SPREEN is a 71 year old Caucasian male who has a history of depression, panic attacks, cognitive impairment, alcohol use disorder, tobacco use disorder was evaluated by telemedicine today.  Patient with history of cognitive impairment, left-sided thalamic stroke, currently agreeable to lowering the dosage of his BuSpar further.  Plan as noted below.  Plan Depressive disorder in remission Cymbalta 60 mg p.o. daily Reduce BuSpar to 7.5 mg p.o. twice daily Aims-2  Other specified anxiety disorder with limited symptom attacks-stable Reduce BuSpar to 7.5 mg p.o. twice daily Cymbalta 60 mg p.o. daily  Alcohol use disorder mild-unstable Provided counseling.  Patient is not ready to cut back further.  Tobacco use disorder-improving Provided counseling for 2 minutes.  Patient is cutting back.  Tardive dyskinesia-chronic-improving We will monitor closely. Aims equals  2  Collateral information obtained from wife as noted above.  Follow-up in clinic in 3 months or sooner if needed.  This note was generated in part or whole with voice recognition software. Voice recognition is usually quite accurate but there are transcription errors that can and very often do occur. I apologize for any typographical errors that were not detected and corrected.       Ursula Alert, MD 05/20/2021, 5:13 PM

## 2021-05-22 ENCOUNTER — Ambulatory Visit (INDEPENDENT_AMBULATORY_CARE_PROVIDER_SITE_OTHER): Payer: Medicare Other

## 2021-05-22 ENCOUNTER — Other Ambulatory Visit: Payer: Self-pay

## 2021-05-22 DIAGNOSIS — Z86718 Personal history of other venous thrombosis and embolism: Secondary | ICD-10-CM | POA: Diagnosis not present

## 2021-05-22 LAB — POCT INR
INR: 3.1 — AB (ref 2.0–3.0)
PT: 36.6

## 2021-06-12 ENCOUNTER — Ambulatory Visit: Payer: Medicare Other

## 2021-06-19 ENCOUNTER — Other Ambulatory Visit: Payer: Self-pay

## 2021-06-19 ENCOUNTER — Other Ambulatory Visit: Payer: Self-pay | Admitting: Family Medicine

## 2021-06-19 ENCOUNTER — Ambulatory Visit (INDEPENDENT_AMBULATORY_CARE_PROVIDER_SITE_OTHER): Payer: Medicare Other

## 2021-06-19 DIAGNOSIS — Z86718 Personal history of other venous thrombosis and embolism: Secondary | ICD-10-CM

## 2021-06-19 LAB — POCT INR
INR: 2.3 (ref 2.0–3.0)
PT: 27.6

## 2021-06-19 NOTE — Patient Instructions (Signed)
Description    No changes. 5 mg daily and 10 mg on M-W-F F/U 4 weeks.

## 2021-06-24 ENCOUNTER — Other Ambulatory Visit: Payer: Self-pay | Admitting: Family Medicine

## 2021-06-24 DIAGNOSIS — I10 Essential (primary) hypertension: Secondary | ICD-10-CM

## 2021-06-24 NOTE — Progress Notes (Signed)
I,Roshena L Chambers,acting as a scribe for Lelon Huh, MD.,have documented all relevant documentation on the behalf of Lelon Huh, MD,as directed by  Lelon Huh, MD while in the presence of Lelon Huh, MD.   Annual Wellness Visit     [name], Male    DOB: [birth year], 71 y.o.   MRN: [id number] Visit Date: 06/25/2021  Today's Provider: Lelon Huh, MD   Chief Complaint  Patient presents with   Medicare Wellness   Hypertension   Depression   Subjective    Chase [name] is a 71 y.o. male who presents today for his Annual Wellness Visit. He reports consuming a general diet. The patient does not participate in regular exercise at present. He generally feels fairly well. He reports sleeping fairly well. He does not have additional problems to discuss today.   HPI Hypertension, follow-up  BP Readings from Last 3 Encounters:  06/25/21 132/81  11/13/20 129/81  09/10/20 (!) 157/84   Wt Readings from Last 3 Encounters:  06/25/21 214 lb (97.1 kg)  11/13/20 212 lb (96.2 kg)  09/10/20 208 lb (94.3 kg)     He was last seen for hypertension 6 months ago.  BP at that visit was 129/81. Management since that visit includes continuing same medication.  He reports good compliance with treatment. He is not having side effects.  He is following a Regular diet. He is not exercising. He does smoke.  Use of agents associated with hypertension: none.   Outside blood pressures are not checked. Symptoms: No chest pain No chest pressure  No palpitations No syncope  No dyspnea No orthopnea  No paroxysmal nocturnal dyspnea No lower extremity edema   Pertinent labs: Lab Results  Component Value Date   CHOL 178 07/04/2020   HDL 67 07/04/2020   LDLCALC 102 (H) 07/04/2020   TRIG 47 07/04/2020   CHOLHDL 2.7 07/04/2020   Lab Results  Component Value Date   NA 135 07/04/2020   K 5.2 07/04/2020   CREATININE 1.05 07/04/2020   EGFR 77 07/04/2020    GLUCOSE 106 (H) 07/04/2020   TSH 2.25 03/13/2017     The 10-year ASCVD risk score (Arnett DK, et al., 2019) is: 22.1%   ---------------------------------------------------------------------------------------------------   Depression, Follow-up  He  was last seen for this 6 months ago. Changes made at last visit include none; continue Duloxetine.   He reports good compliance with treatment. He is not having side effects.   He reports good tolerance of treatment. Current symptoms include: depressed mood He feels he is Unchanged since last visit.  Depression screen [facility] 2/9 06/25/2021 07/04/2020 06/19/2020  Decreased Interest 0 0 0  Down, Depressed, Hopeless 0 0 0  PHQ - 2 Score 0 0 0  Altered sleeping 0 0 -  Tired, decreased energy 0 0 -  Change in appetite 0 0 -  Feeling bad or failure about yourself  0 0 -  Trouble concentrating 0 0 -  Moving slowly or fidgety/restless 0 0 -  Suicidal thoughts 0 0 -  PHQ-9 Score 0 0 -  Difficult doing work/chores Not difficult at all Not difficult at all -  Some encounter information is confidential and restricted. Go to Review Flowsheets activity to see all data.  Some recent data might be hidden    -----------------------------------------------------------------------------------------    Medications: Outpatient Medications Prior to Visit  Medication Sig   acetaminophen (TYLENOL) 325 MG tablet Take 2 tablets (650 mg total) by mouth  every 6 (six) hours as needed for pain.   amLODipine-benazepril (LOTREL) 5-10 MG capsule Take 1 capsule by mouth daily.   busPIRone (BUSPAR) 15 MG tablet Take 0.5 tablets (7.5 mg total) by mouth 2 (two) times daily.   DULoxetine (CYMBALTA) 60 MG capsule TAKE 1 CAPSULE BY MOUTH EVERY DAY   fluticasone (FLONASE) 50 MCG/ACT nasal spray Place 1 spray into both nostrils as needed.    hydrochlorothiazide (HYDRODIURIL) 25 MG tablet TAKE 1 TABLET (25 MG TOTAL) BY MOUTH DAILY.   hydrOXYzine (VISTARIL) 25 MG capsule  Take 1 capsule (25 mg total) by mouth daily as needed for anxiety (for sever panic sx).   montelukast (SINGULAIR) 10 MG tablet TAKE 1 TABLET DAILY FOR COUGH, NASAL DRAINAGE AND ALLERGIES   traMADol (ULTRAM) 50 MG tablet    warfarin (COUMADIN) 5 MG tablet TAKE 1 OR 2 TABLETS (5 OR 10MG) DAILY AS DIRECTED BY PHYSICIAN   amoxicillin (AMOXIL) 500 MG tablet Take by mouth as directed. 1 hour prior to dental procedures (Patient not taking: Reported on 06/25/2021)   No facility-administered medications prior to visit.    Allergies  Allergen Reactions   Chantix [Varenicline Tartrate]     confusion    Patient Care Team: Birdie Sons, MD as PCP - General (Family Medicine) Ursula Alert, MD as Consulting Physician (Psychiatry) Agapito Games (Optometry) Delana Meyer, Dolores Lory, MD (Vascular Surgery)  Review of Systems  Constitutional:  Negative for appetite change, chills, fatigue and fever.  HENT:  Negative for congestion, ear pain, hearing loss, nosebleeds and trouble swallowing.   Eyes:  Negative for pain and visual disturbance.  Respiratory:  Negative for cough, chest tightness and shortness of breath.   Cardiovascular:  Negative for chest pain, palpitations and leg swelling.  Gastrointestinal:  Negative for abdominal pain, blood in stool, constipation, diarrhea, nausea and vomiting.  Endocrine: Negative for polydipsia, polyphagia and polyuria.  Genitourinary:  Negative for dysuria and flank pain.  Musculoskeletal:  Positive for back pain. Negative for arthralgias, joint swelling, myalgias and neck stiffness.  Skin:  Negative for color change, rash and wound.  Neurological:  Negative for dizziness, tremors, seizures, speech difficulty, weakness, light-headedness and headaches.  Hematological:  Bruises/bleeds easily.  Psychiatric/Behavioral:  Negative for behavioral problems, confusion, decreased concentration, dysphoric mood and sleep disturbance. The patient is not nervous/anxious.    All other systems reviewed and are negative.      Objective    Vitals: BP 132/81 (BP Location: Right Arm, Patient Position: Sitting, Cuff Size: Large)    Pulse 68    Temp 98 F (36.7 C) (Oral)    Resp 16    Ht '5\' 10"'  (1.778 m)    Wt 214 lb (97.1 kg)    SpO2 100% Comment: room air   BMI 30.71 kg/m     Physical Exam   General Appearance:    Mildly obese male. Alert, cooperative, in no acute distress, appears stated age  Head:    Normocephalic, without obvious abnormality, atraumatic  Eyes:    PERRL, conjunctiva/corneas clear, EOM's intact, fundi    benign, both eyes       Ears:    Normal TM's and external ear canals, both ears  Neck:   Supple, symmetrical, trachea midline, no adenopathy;       thyroid:  No enlargement/tenderness/nodules; no carotid   bruit or JVD  Back:     Symmetric, no curvature, ROM normal, no CVA tenderness  Lungs:     Diffuse expiratory wheezes bilaterally, respirations  unlabored  Chest wall:    No tenderness or deformity  Heart:    Normal heart rate. Normal rhythm. No murmurs, rubs, or gallops.  S1 and S2 normal  Abdomen:     Soft, non-tender, bowel sounds active all four quadrants,    no masses, no organomegaly  Genitalia:    deferred  Rectal:    deferred  Extremities:   All extremities are intact. No cyanosis or edema  Pulses:   2+ and symmetric all extremities  Skin:   Skin color, texture, turgor normal, no rashes or lesions  Lymph nodes:   Cervical, supraclavicular, and axillary nodes normal  Neurologic:   CNII-XII intact. Normal strength, sensation and reflexes      throughout    Most recent functional status assessment: In your present state of health, do you have any difficulty performing the following activities: 06/25/2021  Hearing? N  Vision? N  Difficulty concentrating or making decisions? Y  Walking or climbing stairs? N  Dressing or bathing? N  Doing errands, shopping? N  Some recent data might be hidden   Most recent fall risk  assessment: Fall Risk  07/04/2020  Falls in the past year? 0  Number falls in past yr: 0  Injury with Fall? 0  Follow up -    Most recent depression screenings: PHQ 2/9 Scores 06/25/2021 07/04/2020  PHQ - 2 Score 0 0  PHQ- 9 Score 0 0  Some encounter information is confidential and restricted. Go to Review Flowsheets activity to see all data.   Most recent cognitive screening: 6CIT Screen 04/10/2017  What Year? 4 points  What month? 0 points  What time? 0 points  Count back from 20 0 points  Months in reverse 0 points  Repeat phrase 10 points  Total Score 14   Most recent Audit-C alcohol use screening Alcohol Use Disorder Test (AUDIT) 06/25/2021  1. How often do you have a drink containing alcohol? 4  2. How many drinks containing alcohol do you have on a typical day when you are drinking? 2  3. How often do you have six or more drinks on one occasion? 1  AUDIT-C Score 7  4. How often during the last year have you found that you were not able to stop drinking once you had started? 0  5. How often during the last year have you failed to do what was normally expected from you because of drinking? 0  6. How often during the last year have you needed a first drink in the morning to get yourself going after a heavy drinking session? 0  7. How often during the last year have you had a feeling of guilt of remorse after drinking? 0  8. How often during the last year have you been unable to remember what happened the night before because you had been drinking? 0  9. Have you or someone else been injured as a result of your drinking? 0  10. Has a relative or friend or a doctor or another health worker been concerned about your drinking or suggested you cut down? 4  Alcohol Use Disorder Identification Test Final Score (AUDIT) 11  Alcohol Brief Interventions/Follow-up -   A score of 3 or more in women, and 4 or more in men indicates increased risk for alcohol abuse, EXCEPT if all of the points  are from question 1   No results found for any visits on 06/25/21.  Assessment & Plan     Annual  wellness visit done today including the all of the following: Reviewed patient's Family Medical History Reviewed and updated list of patient's medical providers Assessment of cognitive impairment was done Assessed patient's functional ability Established a written schedule for health screening [place] Completed and Reviewed  Exercise Activities and Dietary recommendations  Goals      Quit Smoking     Recommend to continue efforts to reduce smoking habits until no longer smoking.       Reduce alcohol intake     Recommend to cut back on alcohol in take by half.        Immunization History  Administered Date(s) Administered   Fluad Quad(high Dose 65+) 04/11/2020, 02/20/2021   Influenza Split 01/27/2012   Influenza, High Dose Seasonal PF 12/18/2016, 01/15/2018   Influenza, Quadrivalent, Recombinant, Inj, Pf 01/31/2019   Influenza,inj,Quad PF,6+ Mos 04/06/2015   PFIZER(Purple Top)SARS-COV-2 Vaccination 05/17/2019, 06/07/2019, 02/01/2020   Pneumococcal Conjugate-13 12/18/2016   Pneumococcal Polysaccharide-23 01/27/2012, 05/04/2018   Td 11/23/2015   Tdap 11/10/2005    Health Maintenance  Topic Date Due   COLONOSCOPY (Pts 45-70yr Insurance coverage will need to be confirmed)  Never done   Zoster Vaccines- Shingrix (1 of 2) Never done   COVID-19 Vaccine (4 - Booster for Pfizer series) 03/28/2020   TETANUS/TDAP  11/22/2025   Pneumonia Vaccine 71 Years old  Completed   INFLUENZA VACCINE  Completed   Hepatitis C Screening  Completed   HPV VACCINES  Aged Out     Discussed health benefits of physical activity, and encouraged him to engage in regular exercise appropriate for his age and condition.    1. Centrilobular emphysema ([place] Encouraged smoking cessation.   2. MDD (major depressive disorder), [place] episode, [place] Doing well on current  dose of duloxetine.   3. Primary hypertension Fairly well controlled Continue current medications.   - CBC - Comprehensive metabolic panel - Lipid panel  4. Tobacco use disorder Encourage smoking cessation.   Had LDCT 06/2020 with incidental finding of thoracic AA. Per Dr SDelana Meyerneeds annual follow up. Expect patient to hear from Dr. SNino Parsleyoffice within the next month or two to schedule follow up.     The entirety of the information documented in the History of Present Illness, Review of Systems and Physical Exam were personally obtained by me. Portions of this information were initially documented by the CMA and reviewed by me for thoroughness and accuracy.     DLelon Huh MD  [phone](phone) [phone](fax)  [place]

## 2021-06-25 ENCOUNTER — Other Ambulatory Visit: Payer: Self-pay

## 2021-06-25 ENCOUNTER — Ambulatory Visit (INDEPENDENT_AMBULATORY_CARE_PROVIDER_SITE_OTHER): Payer: Medicare Other | Admitting: Family Medicine

## 2021-06-25 ENCOUNTER — Encounter: Payer: Self-pay | Admitting: Family Medicine

## 2021-06-25 VITALS — BP 132/81 | HR 68 | Temp 98.0°F | Resp 16 | Ht 70.0 in | Wt 214.0 lb

## 2021-06-25 DIAGNOSIS — F320 Major depressive disorder, single episode, mild: Secondary | ICD-10-CM | POA: Diagnosis not present

## 2021-06-25 DIAGNOSIS — Z0000 Encounter for general adult medical examination without abnormal findings: Secondary | ICD-10-CM | POA: Diagnosis not present

## 2021-06-25 DIAGNOSIS — I10 Essential (primary) hypertension: Secondary | ICD-10-CM

## 2021-06-25 DIAGNOSIS — F17200 Nicotine dependence, unspecified, uncomplicated: Secondary | ICD-10-CM | POA: Diagnosis not present

## 2021-06-25 DIAGNOSIS — J432 Centrilobular emphysema: Secondary | ICD-10-CM

## 2021-06-25 NOTE — Patient Instructions (Signed)
Please review the attached list of medications and notify my office if there are any errors.   Please cut down or quit smoking

## 2021-06-26 LAB — CBC
Hematocrit: 40.8 % (ref 37.5–51.0)
Hemoglobin: 14.3 g/dL (ref 13.0–17.7)
MCH: 33.4 pg — ABNORMAL HIGH (ref 26.6–33.0)
MCHC: 35 g/dL (ref 31.5–35.7)
MCV: 95 fL (ref 79–97)
Platelets: 206 10*3/uL (ref 150–450)
RBC: 4.28 x10E6/uL (ref 4.14–5.80)
RDW: 13.3 % (ref 11.6–15.4)
WBC: 5.6 10*3/uL (ref 3.4–10.8)

## 2021-06-26 LAB — LIPID PANEL
Chol/HDL Ratio: 3.1 ratio (ref 0.0–5.0)
Cholesterol, Total: 184 mg/dL (ref 100–199)
HDL: 60 mg/dL (ref >39)
LDL Chol Calc (NIH): 112 mg/dL — ABNORMAL HIGH (ref 0–99)
Triglycerides: 66 mg/dL (ref 0–149)
VLDL Cholesterol Cal: 12 mg/dL (ref 5–40)

## 2021-06-26 LAB — COMPREHENSIVE METABOLIC PANEL
ALT: 24 IU/L (ref 0–44)
AST: 25 IU/L (ref 0–40)
Albumin/Globulin Ratio: 2.3 — ABNORMAL HIGH (ref 1.2–2.2)
Albumin: 4.5 g/dL (ref 3.8–4.8)
Alkaline Phosphatase: 53 IU/L (ref 44–121)
BUN/Creatinine Ratio: 10 (ref 10–24)
BUN: 9 mg/dL (ref 8–27)
Bilirubin Total: 0.3 mg/dL (ref 0.0–1.2)
CO2: 23 mmol/L (ref 20–29)
Calcium: 9.3 mg/dL (ref 8.6–10.2)
Chloride: 99 mmol/L (ref 96–106)
Creatinine, Ser: 0.88 mg/dL (ref 0.76–1.27)
Globulin, Total: 2 g/dL (ref 1.5–4.5)
Glucose: 91 mg/dL (ref 70–99)
Potassium: 4.6 mmol/L (ref 3.5–5.2)
Sodium: 135 mmol/L (ref 134–144)
Total Protein: 6.5 g/dL (ref 6.0–8.5)
eGFR: 93 mL/min/{1.73_m2} (ref >59)

## 2021-07-01 ENCOUNTER — Telehealth (INDEPENDENT_AMBULATORY_CARE_PROVIDER_SITE_OTHER): Payer: Self-pay | Admitting: *Deleted

## 2021-07-01 NOTE — Telephone Encounter (Signed)
Pt. Wife called in reference to making an appointment for [facility] for a CT Scan.   ?

## 2021-07-11 ENCOUNTER — Telehealth: Payer: Self-pay | Admitting: Psychiatry

## 2021-07-11 ENCOUNTER — Other Ambulatory Visit: Payer: Self-pay | Admitting: Psychiatry

## 2021-07-11 MED ORDER — BUSPIRONE HCL 7.5 MG PO TABS: 7.5000 mg | ORAL_TABLET | Freq: Two times a day (BID) | ORAL | 1 refills | Status: DC

## 2021-07-11 NOTE — Telephone Encounter (Signed)
I have sent Buspar to pharmacy. ?

## 2021-07-17 ENCOUNTER — Other Ambulatory Visit: Payer: Self-pay

## 2021-07-17 ENCOUNTER — Ambulatory Visit (INDEPENDENT_AMBULATORY_CARE_PROVIDER_SITE_OTHER): Payer: Medicare Other

## 2021-07-17 ENCOUNTER — Other Ambulatory Visit (INDEPENDENT_AMBULATORY_CARE_PROVIDER_SITE_OTHER): Payer: Self-pay | Admitting: Nurse Practitioner

## 2021-07-17 DIAGNOSIS — Z86718 Personal history of other venous thrombosis and embolism: Secondary | ICD-10-CM | POA: Diagnosis not present

## 2021-07-17 DIAGNOSIS — I7121 Aneurysm of the ascending aorta, without rupture: Secondary | ICD-10-CM

## 2021-07-17 LAB — POCT INR: INR: 2.7 (ref 2.0–3.0)

## 2021-07-17 NOTE — Patient Instructions (Signed)
Description   ? No changes. 5 mg daily and 10 mg on M-W-F ?F/U 4 weeks.  ? ?  ? ? ?

## 2021-07-17 NOTE — Telephone Encounter (Signed)
Order placed

## 2021-07-23 ENCOUNTER — Telehealth (INDEPENDENT_AMBULATORY_CARE_PROVIDER_SITE_OTHER): Payer: Self-pay | Admitting: Vascular Surgery

## 2021-07-23 NOTE — Telephone Encounter (Signed)
Spoke with Medicare - no prior auth needed for CPT [phone]. Ref # [id number]. Spoke with pt's wife. Stated that they should be able to call scheduling and make an appt - and also I made appt (per pt's wife) for June with Dr. Delana Meyer for CT results. Pt's wife stated that she didn't want an appt until then. Nothing further is needed at this time.  ?

## 2021-07-23 NOTE — Telephone Encounter (Signed)
Patient's wife called and stated patient is supposed to have a Contrasted CT Scan done and they never received the order.  She would like the order faxed over to [phone] and then call her to let her know the order was sent. ?

## 2021-07-30 ENCOUNTER — Other Ambulatory Visit (INDEPENDENT_AMBULATORY_CARE_PROVIDER_SITE_OTHER): Payer: Self-pay | Admitting: Nurse Practitioner

## 2021-07-30 ENCOUNTER — Telehealth (INDEPENDENT_AMBULATORY_CARE_PROVIDER_SITE_OTHER): Payer: Self-pay

## 2021-07-30 DIAGNOSIS — I7121 Aneurysm of the ascending aorta, without rupture: Secondary | ICD-10-CM

## 2021-07-30 NOTE — Telephone Encounter (Signed)
Spoke with pt's wife [name] to advise that they can call radiology and schedule the CT. She stated that they had been on vacation for the last 4-5 days and forgot. Mrs. [name] states that she will call them now. I verified that she had radiology scheduling # and she confirmed. Nothing further needed at this time.  ?

## 2021-08-08 ENCOUNTER — Encounter: Payer: Self-pay | Admitting: Psychiatry

## 2021-08-08 ENCOUNTER — Telehealth (INDEPENDENT_AMBULATORY_CARE_PROVIDER_SITE_OTHER): Payer: Medicare Other | Admitting: Psychiatry

## 2021-08-08 ENCOUNTER — Telehealth: Payer: Self-pay | Admitting: Family Medicine

## 2021-08-08 DIAGNOSIS — F17200 Nicotine dependence, unspecified, uncomplicated: Secondary | ICD-10-CM | POA: Diagnosis not present

## 2021-08-08 DIAGNOSIS — F0631 Mood disorder due to known physiological condition with depressive features: Secondary | ICD-10-CM

## 2021-08-08 DIAGNOSIS — F1010 Alcohol abuse, uncomplicated: Secondary | ICD-10-CM | POA: Diagnosis not present

## 2021-08-08 DIAGNOSIS — F418 Other specified anxiety disorders: Secondary | ICD-10-CM | POA: Diagnosis not present

## 2021-08-08 DIAGNOSIS — F09 Unspecified mental disorder due to known physiological condition: Secondary | ICD-10-CM

## 2021-08-08 DIAGNOSIS — G2401 Drug induced subacute dyskinesia: Secondary | ICD-10-CM

## 2021-08-08 MED ORDER — BUSPIRONE HCL 7.5 MG PO TABS: 7.5000 mg | ORAL_TABLET | ORAL | 1 refills | Status: DC

## 2021-08-08 NOTE — Progress Notes (Signed)
Virtual Visit via Video Note ? ?I connected with Chase [name] on 08/08/21 at 10:20 AM EDT by a video enabled telemedicine application and verified that I am speaking with the correct person using two identifiers. ? ?Location ?Provider Location : [facility] ?Patient Location : Home ? ?Participants: Patient ,Spouse, Provider ? ?  ?I discussed the limitations of evaluation and management by telemedicine and the availability of in person appointments. The patient expressed understanding and agreed to proceed. ?  ?I discussed the assessment and treatment plan with the patient. The patient was provided an opportunity to ask questions and all were answered. The patient agreed with the plan and demonstrated an understanding of the instructions. ?  ?The patient was advised to call back or seek an in-person evaluation if the symptoms worsen or if the condition fails to improve as anticipated. ? ?[place] MD OP Progress Note ? ?08/08/2021 1:12 PM ?Chase [name]  ?MRN:  [id number] ? ?Chief Complaint:  ?Chief Complaint  ?Patient presents with  ? Follow-up : 71 year old Caucasian male with history of depression, panic attacks, cognitive disorder, alcohol and tobacco use disorder presented for medication management.  ? ?HPI: Chase [name] is a 71 year old Caucasian male who has a history of depression, panic attacks, cognitive disorder, alcohol use disorder, tobacco use disorder was evaluated by telemedicine today. ? ?Patient being a limited historian, spouse provided collateral information. ? ?Patient reports overall he is tolerating being tapered off of the BuSpar.  Does have anxiety symptoms, sporadic however manageable. ? ?Denies any depression. ? ?Reports sleep as good. ? ?Denies suicidality or homicidality or perceptual disturbances. ? ?Collateral information obtained from his wife who reports patient is fairly doing well on the lower dosage of BuSpar.  Patient as well as wife agreeable to tapering the BuSpar down  further. ? ?Patient appeared to be alert, oriented to person place time situation.  Continues to have cognitive issues, chronic has good support system from his wife. ? ?Continues to drink alcohol daily as well as smoke cigarettes, is not interested in cutting back. ? ?Visit Diagnosis:  ?  ICD-10-CM   ?1. 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 in remission  ?  ?2. Other specified anxiety disorders  F41.8   ? with limited symptom attacks  ?  ?3. Tobacco use disorder  F17.200 busPIRone (BUSPAR) 7.5 MG tablet  ?  ?4. Alcohol use disorder, mild, abuse  F10.10   ?  ?5. Tardive dyskinesia  G24.01   ?  ?6. Mild cognitive disorder  F09   ?  ? ? ?Past Psychiatric History: Reviewed past psychiatric history from progress note on 08/10/2017.  Past trials of medications-Effexor, Wellbutrin, Cymbalta, Xanax, Klonopin. ? ?Past Medical History:  ?Past Medical History:  ?Diagnosis Date  ? Cerebrovascular accident (CVA) ([place]) 10/09/2011  ? Clotting disorder ([place])   ? DVT (deep venous thrombosis) ([place])   ? Hearing deficit   ? History of ischemic vertebrobasilar artery thalamic stroke 2001  ? History of stroke   ? Hyperlipidemia   ? Hypertension   ? Patent foramen ovale   ? Right knee DJD   ? Stroke [facility]) 2001  ?  ?Past Surgical History:  ?Procedure Laterality Date  ? APPENDECTOMY    ? FRACTURE SURGERY    ? R leg  ? INCISION AND DRAINAGE Right 06/30/2012  ? Procedure: INCISION AND DRAINAGE;  Surgeon: Lorn Junes, MD;  Location: [place];  Service:  Orthopedics;  Laterality: Right;  ? KNEE ARTHROSCOPY    ? right knee  ? KNEE BURSECTOMY Right 06/30/2012  ? Procedure: KNEE BURSECTOMY;  Surgeon: Lorn Junes, MD;  Location: [place];  Service: Orthopedics;  Laterality: Right;  Pre-patellar with wound closure  ? TOTAL KNEE ARTHROPLASTY  01/26/2012  ? Procedure: TOTAL KNEE ARTHROPLASTY;  Surgeon: Lorn Junes, MD;  Location: [place];  Service: Orthopedics;  Laterality: Right;  ? ? ?Family  Psychiatric History: Reviewed family psychiatric history from progress note on 08/10/2017. ? ?Family History:  ?Family History  ?Problem Relation Age of Onset  ? Cancer Father   ? Seizures Brother   ? ? ?Social History: Reviewed social history from progress note on 08/10/2017. ?Social History  ? ?Socioeconomic History  ? Marital status: Married  ?  Spouse name: debra  ? Number of children: 0  ? Years of education: 47  ? Highest education level: Master's degree (e.g., MA, MS, MEng, MEd, MSW, MBA)  ?Occupational History  ? Occupation: retired  ? Occupation: disabled @ age 29  ?Tobacco Use  ? Smoking status: Heavy Smoker  ?  Packs/day: 2.00  ?  Years: 49.00  ?  Pack years: 98.00  ?  Types: Cigarettes  ?  Last attempt to quit: 06/13/2016  ?  Years since quitting: 5.1  ? Smokeless tobacco: Never  ? Tobacco comments:  ?  started smoking at age 19  ?Vaping Use  ? Vaping Use: Never used  ?Substance and Sexual Activity  ? Alcohol use: Yes  ?  Alcohol/week: 28.0 - 42.0 standard drinks  ?  Types: 28 - 42 Cans of beer per week  ?  Comment: 4-6 beers a day  ? Drug use: No  ? Sexual activity: Not on file  ?Other Topics Concern  ? Not on file  ?Social History Narrative  ? Not on file  ? ?Social Determinants of Health  ? ?Financial Resource Strain: Not on file  ?Food Insecurity: Not on file  ?Transportation Needs: Not on file  ?Physical Activity: Not on file  ?Stress: Not on file  ?Social Connections: Not on file  ? ? ?Allergies:  ?Allergies  ?Allergen Reactions  ? Chantix [Varenicline Tartrate]   ?  confusion  ? ? ?Metabolic Disorder Labs: ?No results found for: HGBA1C, MPG ?No results found for: PROLACTIN ?Lab Results  ?Component Value Date  ? CHOL 184 06/25/2021  ? TRIG 66 06/25/2021  ? HDL 60 06/25/2021  ? CHOLHDL 3.1 06/25/2021  ? LDLCALC 112 (H) 06/25/2021  ? LDLCALC 102 (H) 07/04/2020  ? ?Lab Results  ?Component Value Date  ? TSH 2.25 03/13/2017  ? ? ?Therapeutic Level Labs: ?No results found for: LITHIUM ?No results found for:  VALPROATE ?No components found for:  CBMZ ? ?Current Medications: ?Current Outpatient Medications  ?Medication Sig Dispense Refill  ? acetaminophen (TYLENOL) 325 MG tablet Take 2 tablets (650 mg total) by mouth every 6 (six) hours as needed for pain. 60 tablet 0  ? amLODipine-benazepril (LOTREL) 5-10 MG capsule Take 1 capsule by mouth daily. 90 capsule 4  ? amoxicillin (AMOXIL) 500 MG tablet Take by mouth as directed. 1 hour prior to dental procedures (Patient not taking: Reported on 06/25/2021)    ? busPIRone (BUSPAR) 7.5 MG tablet Take 1 tablet (7.5 mg total) by mouth as directed. Take half tablet twice a day 180 tablet 1  ? doxycycline (VIBRA-TABS) 100 MG tablet Take by mouth.    ? DULoxetine (CYMBALTA) 60 MG capsule  TAKE 1 CAPSULE BY MOUTH EVERY DAY 90 capsule 1  ? fluticasone (FLONASE) 50 MCG/ACT nasal spray Place 1 spray into both nostrils as needed.     ? hydrochlorothiazide (HYDRODIURIL) 25 MG tablet TAKE 1 TABLET (25 MG TOTAL) BY MOUTH DAILY. 90 tablet 4  ? hydrOXYzine (VISTARIL) 25 MG capsule Take 1 capsule (25 mg total) by mouth daily as needed for anxiety (for sever panic sx). 90 capsule 1  ? montelukast (SINGULAIR) 10 MG tablet TAKE 1 TABLET DAILY FOR COUGH, NASAL DRAINAGE AND ALLERGIES 90 tablet 3  ? traMADol (ULTRAM) 50 MG tablet  (Patient not taking: Reported on 08/08/2021)    ? traMADol-acetaminophen (ULTRACET) 37.5-325 MG tablet Take by mouth. (Patient not taking: Reported on 08/08/2021)    ? warfarin (COUMADIN) 5 MG tablet TAKE 1 OR 2 TABLETS (5 OR '10MG'$ ) DAILY AS DIRECTED BY PHYSICIAN 180 tablet 4  ? ?No current facility-administered medications for this visit.  ? ? ? ?Musculoskeletal: ?Strength & Muscle Tone:  UTA ?Gait & Station:  Seated ?Patient leans: N/A ? ?Psychiatric Specialty Exam: ?Review of Systems  ?Psychiatric/Behavioral:  The patient is nervous/anxious.   ?All other systems reviewed and are negative.  ?There were no vitals taken for this visit.There is no height or weight on file to  calculate BMI.  ?General Appearance: Casual  ?Eye Contact:  Fair  ?Speech:  Clear and Coherent  ?Volume:  Normal  ?Mood:  Anxious, coping well  ?Affect:  Congruent  ?Thought Process:  Goal Directed and Descriptions of [facility]

## 2021-08-14 ENCOUNTER — Ambulatory Visit: Payer: Medicare Other

## 2021-08-14 ENCOUNTER — Ambulatory Visit (INDEPENDENT_AMBULATORY_CARE_PROVIDER_SITE_OTHER): Payer: Medicare Other

## 2021-08-14 DIAGNOSIS — Z86718 Personal history of other venous thrombosis and embolism: Secondary | ICD-10-CM | POA: Diagnosis not present

## 2021-08-14 LAB — POCT INR
INR: 4.4 — AB (ref 2.0–3.0)
PT: 53

## 2021-08-14 NOTE — Patient Instructions (Signed)
Description   ? Hold for 3 days then resume to 5 mg daily and 10 mg on M-W-F ?F/U 2 weeks.  ? ?  ?  ?

## 2021-08-17 DIAGNOSIS — S8011XA Contusion of right lower leg, initial encounter: Secondary | ICD-10-CM | POA: Diagnosis not present

## 2021-08-21 ENCOUNTER — Ambulatory Visit: Payer: Medicare Other

## 2021-08-22 DIAGNOSIS — S8011XA Contusion of right lower leg, initial encounter: Secondary | ICD-10-CM | POA: Diagnosis not present

## 2021-08-26 ENCOUNTER — Ambulatory Visit
Admission: RE | Admit: 2021-08-26 | Discharge: 2021-08-26 | Disposition: A | Discharge status: Home / Self Care | Payer: Medicare Other | Source: Ambulatory Visit | Attending: Nurse Practitioner | Admitting: Nurse Practitioner

## 2021-08-26 DIAGNOSIS — I7121 Aneurysm of the ascending aorta, without rupture: Secondary | ICD-10-CM | POA: Diagnosis not present

## 2021-08-28 ENCOUNTER — Ambulatory Visit (INDEPENDENT_AMBULATORY_CARE_PROVIDER_SITE_OTHER): Payer: Medicare Other

## 2021-08-28 DIAGNOSIS — Z86718 Personal history of other venous thrombosis and embolism: Secondary | ICD-10-CM

## 2021-08-28 LAB — POCT INR
INR: 3.7 — AB (ref 2.0–3.0)
PT: 43.8

## 2021-08-28 NOTE — Patient Instructions (Signed)
Description   ?5 mg daily and 10 mg on M and F ?F/U 2 weeks.  ? ?  ?  ?

## 2021-08-31 ENCOUNTER — Other Ambulatory Visit: Payer: Self-pay | Admitting: Family Medicine

## 2021-08-31 DIAGNOSIS — I10 Essential (primary) hypertension: Secondary | ICD-10-CM

## 2021-09-05 DIAGNOSIS — T148XXA Other injury of unspecified body region, initial encounter: Secondary | ICD-10-CM | POA: Diagnosis not present

## 2021-09-05 DIAGNOSIS — S8011XA Contusion of right lower leg, initial encounter: Secondary | ICD-10-CM | POA: Diagnosis not present

## 2021-09-10 ENCOUNTER — Telehealth: Payer: Self-pay

## 2021-09-10 NOTE — Telephone Encounter (Signed)
left message that the medication changes he was tolerating well and wanted to speak with you about the medication.  ?

## 2021-09-11 ENCOUNTER — Ambulatory Visit (INDEPENDENT_AMBULATORY_CARE_PROVIDER_SITE_OTHER): Payer: Medicare Other

## 2021-09-11 DIAGNOSIS — Z86718 Personal history of other venous thrombosis and embolism: Secondary | ICD-10-CM | POA: Diagnosis not present

## 2021-09-11 LAB — POCT INR
INR: 2.8 (ref 2.0–3.0)
PT: 33.2

## 2021-09-11 NOTE — Telephone Encounter (Signed)
done

## 2021-09-11 NOTE — Telephone Encounter (Signed)
Returned call to patient, wife picked up the phone and said patient currently tolerating 7.5 mg twice a day, would like to reduce it to 7.5 mg daily. ? ?Discussed with patient's wife to give a trial of 7.5 mg daily and as long as he tolerates it he could stay on that dose for now. ? ? ?

## 2021-09-11 NOTE — Patient Instructions (Signed)
Description   5 mg daily and 10 mg on M and F F/U 3 weeks.       

## 2021-09-13 ENCOUNTER — Telehealth: Payer: Self-pay

## 2021-09-13 DIAGNOSIS — D225 Melanocytic nevi of trunk: Secondary | ICD-10-CM | POA: Diagnosis not present

## 2021-09-13 DIAGNOSIS — L570 Actinic keratosis: Secondary | ICD-10-CM | POA: Diagnosis not present

## 2021-09-13 DIAGNOSIS — L821 Other seborrheic keratosis: Secondary | ICD-10-CM | POA: Diagnosis not present

## 2021-09-13 DIAGNOSIS — D2262 Melanocytic nevi of left upper limb, including shoulder: Secondary | ICD-10-CM | POA: Diagnosis not present

## 2021-09-13 DIAGNOSIS — D2261 Melanocytic nevi of right upper limb, including shoulder: Secondary | ICD-10-CM | POA: Diagnosis not present

## 2021-09-13 NOTE — Telephone Encounter (Signed)
Copied from [place] [phone]. Topic: Appointment Scheduling - Scheduling Inquiry for Clinic >> Sep 13, 2021 11:43 AM Erick Blinks wrote: Reason for CRM: Pt wants to reschedule his coumadin clinic appt to the 14th of June, tried calling office. Please advise

## 2021-09-16 DIAGNOSIS — S8011XA Contusion of right lower leg, initial encounter: Secondary | ICD-10-CM | POA: Diagnosis not present

## 2021-09-16 DIAGNOSIS — T148XXA Other injury of unspecified body region, initial encounter: Secondary | ICD-10-CM | POA: Diagnosis not present

## 2021-09-30 ENCOUNTER — Ambulatory Visit (INDEPENDENT_AMBULATORY_CARE_PROVIDER_SITE_OTHER): Payer: Medicare Other | Admitting: Vascular Surgery

## 2021-09-30 ENCOUNTER — Encounter (INDEPENDENT_AMBULATORY_CARE_PROVIDER_SITE_OTHER): Payer: Self-pay | Admitting: Vascular Surgery

## 2021-09-30 VITALS — BP 131/86 | HR 73 | Resp 17 | Ht 70.5 in | Wt 209.0 lb

## 2021-09-30 DIAGNOSIS — M1711 Unilateral primary osteoarthritis, right knee: Secondary | ICD-10-CM

## 2021-09-30 DIAGNOSIS — J432 Centrilobular emphysema: Secondary | ICD-10-CM

## 2021-09-30 DIAGNOSIS — I10 Essential (primary) hypertension: Secondary | ICD-10-CM

## 2021-09-30 DIAGNOSIS — I7121 Aneurysm of the ascending aorta, without rupture: Secondary | ICD-10-CM

## 2021-09-30 NOTE — Progress Notes (Signed)
MRN : [id number]  Chase [name] is a 71 y.o. ([birth year]) male who presents with chief complaint of check circulation.  History of Present Illness:   The patient presents to the office for follow up evaluation of an ascending thoracic aortic aneurysm. The aneurysm was found incidentally by CT scan. Patient denies chest pain or unusual back pain, no other chest or abdominal complaints.  No history of an abrupt onset of a painful toe associated with blue discoloration.     No family history of TAA/AAA.   Patient denies amaurosis fugax or TIA symptoms.  There is no history of claudication or rest pain symptoms of the lower extremities.   The patient denies angina or shortness of breath.  CT scan dated 08/26/2021, shows an ascending TAA that measures 4.5 cm.  No change from previous CT scan.    Current Meds  Medication Sig   amLODipine-benazepril (LOTREL) 5-10 MG capsule TAKE 1 CAPSULE EVERY DAY (NEED MD APPOINTMENT)   amoxicillin (AMOXIL) 500 MG tablet Take by mouth as directed. 1 hour prior to dental procedures   busPIRone (BUSPAR) 7.5 MG tablet Take 1 tablet (7.5 mg total) by mouth as directed. Take half tablet twice a day   DULoxetine (CYMBALTA) 60 MG capsule TAKE 1 CAPSULE BY MOUTH EVERY DAY   fluticasone (FLONASE) 50 MCG/ACT nasal spray Place 1 spray into both nostrils as needed.    hydrochlorothiazide (HYDRODIURIL) 25 MG tablet TAKE 1 TABLET (25 MG TOTAL) BY MOUTH DAILY.   hydrOXYzine (VISTARIL) 25 MG capsule Take 1 capsule (25 mg total) by mouth daily as needed for anxiety (for sever panic sx).   montelukast (SINGULAIR) 10 MG tablet TAKE 1 TABLET DAILY FOR COUGH, NASAL DRAINAGE AND ALLERGIES   warfarin (COUMADIN) 5 MG tablet TAKE 1 OR 2 TABLETS (5 OR '10MG'$ ) DAILY AS DIRECTED BY PHYSICIAN    Past Medical History:  Diagnosis Date   Cerebrovascular accident (CVA) ([place]) 10/09/2011   Clotting disorder ([place])    DVT (deep venous thrombosis) ([place])    Hearing deficit    History of  ischemic vertebrobasilar artery thalamic stroke 2001   History of stroke    Hyperlipidemia    Hypertension    Patent foramen ovale    Right knee DJD    Stroke ([place]) 2001    Past Surgical History:  Procedure Laterality Date   APPENDECTOMY     FRACTURE SURGERY     R leg   INCISION AND DRAINAGE Right 06/30/2012   Procedure: INCISION AND DRAINAGE;  Surgeon: Lorn Junes, MD;  Location: [facility];  Service: Orthopedics;  Laterality: Right;   KNEE ARTHROSCOPY     right knee   KNEE BURSECTOMY Right 06/30/2012   Procedure: KNEE BURSECTOMY;  Surgeon: Lorn Junes, MD;  Location: [facility];  Service: Orthopedics;  Laterality: Right;  Pre-patellar with wound closure   TOTAL KNEE ARTHROPLASTY  01/26/2012   Procedure: TOTAL KNEE ARTHROPLASTY;  Surgeon: Lorn Junes, MD;  Location: [facility];  Service: Orthopedics;  Laterality: Right;    Social History Social History   Tobacco Use   Smoking status: Heavy Smoker    Packs/day: 2.00    Years: 49.00    Pack years: 98.00    Types: Cigarettes    Last attempt to quit: 06/13/2016    Years since quitting: 5.3   Smokeless tobacco: Never   Tobacco comments:    started smoking at age 47  Vaping Use   Vaping Use: Never  used  Substance Use Topics   Alcohol use: Yes    Alcohol/week: 28.0 - 42.0 standard drinks    Types: 28 - 42 Cans of beer per week    Comment: 4-6 beers a day   Drug use: No    Family History Family History  Problem Relation Age of Onset   Cancer Father    Seizures Brother     Allergies  Allergen Reactions   Chantix [Varenicline Tartrate]     confusion     REVIEW OF SYSTEMS (Negative unless checked)  Constitutional: '[]'$ Weight loss  '[]'$ Fever  '[]'$ Chills Cardiac: '[]'$ Chest pain   '[]'$ Chest pressure   '[]'$ Palpitations   '[]'$ Shortness of breath when laying flat   '[]'$ Shortness of breath with exertion. Vascular:  '[x]'$ Pain in legs with walking   '[]'$ Pain in legs at rest  '[]'$ History of DVT   '[]'$ Phlebitis   '[]'$ Swelling in legs   '[]'$ Varicose veins    '[]'$ Non-healing ulcers Pulmonary:   '[]'$ Uses home oxygen   '[]'$ Productive cough   '[]'$ Hemoptysis   '[]'$ Wheeze  '[]'$ COPD   '[]'$ Asthma Neurologic:  '[]'$ Dizziness   '[]'$ Seizures   '[]'$ History of stroke   '[]'$ History of TIA  '[]'$ Aphasia   '[]'$ Vissual changes   '[]'$ Weakness or numbness in arm   '[]'$ Weakness or numbness in leg Musculoskeletal:   '[]'$ Joint swelling   '[]'$ Joint pain   '[]'$ Low back pain Hematologic:  '[]'$ Easy bruising  '[]'$ Easy bleeding   '[]'$ Hypercoagulable state   '[]'$ Anemic Gastrointestinal:  '[]'$ Diarrhea   '[]'$ Vomiting  '[]'$ Gastroesophageal reflux/heartburn   '[]'$ Difficulty swallowing. Genitourinary:  '[]'$ Chronic kidney disease   '[]'$ Difficult urination  '[]'$ Frequent urination   '[]'$ Blood in urine Skin:  '[]'$ Rashes   '[]'$ Ulcers  Psychological:  '[]'$ History of anxiety   '[]'$  History of major depression.  Physical Examination  Vitals:   09/30/21 1101  BP: 131/86  Pulse: 73  Resp: 17  Weight: 209 lb (94.8 kg)  Height: 5' 10.5" (1.791 m)   Body mass index is 29.56 kg/m. Gen: WD/WN, NAD Head: [place]/AT, No temporalis wasting.  Ear/Nose/Throat: Hearing grossly intact, nares w/o erythema or drainage Eyes: PER, EOMI, sclera nonicteric.  Neck: Supple, no masses.  No bruit or JVD.  Pulmonary:  Good air movement, no audible wheezing, no use of accessory muscles.  Cardiac: RRR, normal S1, S2, no Murmurs. Vascular:  no open wounds Vessel Right Left  Radial Palpable Palpable  Gastrointestinal: soft, non-distended. No guarding/no peritoneal signs.  Musculoskeletal: M/S 5/5 throughout.  No visible deformity.  Neurologic: CN 2-12 intact. Pain and light touch intact in extremities.  Symmetrical.  Speech is fluent. Motor exam as listed above. Psychiatric: Judgment intact, Mood & affect appropriate for pt's clinical situation. Dermatologic: No rashes or ulcers noted.  No changes consistent with cellulitis.   CBC Lab Results  Component Value Date   WBC 5.6 06/25/2021   HGB 14.3 06/25/2021   HCT 40.8 06/25/2021   MCV 95 06/25/2021   PLT 206 06/25/2021     BMET    Component Value Date/Time   NA 135 06/25/2021 0934   K 4.6 06/25/2021 0934   CL 99 06/25/2021 0934   CO2 23 06/25/2021 0934   GLUCOSE 91 06/25/2021 0934   GLUCOSE 81 03/13/2017 1538   BUN 9 06/25/2021 0934   CREATININE 0.88 06/25/2021 0934   CREATININE 1.08 03/13/2017 1538   CALCIUM 9.3 06/25/2021 0934   GFRNONAA 66 06/17/2019 1434   GFRNONAA 71 03/13/2017 1538   GFRAA 77 06/17/2019 1434   GFRAA 82 03/13/2017 1538   CrCl cannot be calculated (Patient's  most recent lab result is older than the maximum 21 days allowed.).  COAG Lab Results  Component Value Date   INR 2.8 09/11/2021   INR 3.7 (A) 08/28/2021   INR 4.4 (A) 08/14/2021    Radiology No results found.   Assessment/Plan 1. Aneurysm of ascending aorta without rupture (HCC) Recommend:  No surgery or intervention is indicated at this time.  The patient has an asymptomatic thoracic aortic aneurysm that is less than 6.0 cm in maximal diameter.  I have discussed the natural history of thoracic aortic aneurysm and the small risk of rupture for aneurysm less than 6.5 cm in size.  However, as these small aneurysms tend to enlarge over time, continued surveillance with CT scan is mandatory.   I have also discussed optimizing medical management with hypertension and lipid control and the importance of abstinence from tobacco.  The patient is also encouraged to exercise a minimum of 30 minutes 4 times a week.   Should the patient develop new onset chest or back pain or signs of peripheral embolization they are instructed to seek medical attention immediately and to alert the physician providing care that they have an aneurysm in the chest.   The patient voices their understanding.  The patient will return as ordered in 2 years with a CT scan of the chest.  Patient is informed that we will schedule the CT chest without contrast prior to his visit and if he has not had his scan he should let us know as a follow up  without the scan is not helpful.   2. Primary hypertension Continue antihypertensive medications as already ordered, these medications have been reviewed and there are no changes at this time.   3. Centrilobular emphysema ([place]) Continue pulmonary medications and aerosols as already ordered, these medications have been reviewed and there are no changes at this time.    4. Primary osteoarthritis of right knee Continue NSAID medications as already ordered, these medications have been reviewed and there are no changes at this time.  Continued activity and therapy was stressed.     Hortencia Pilar, MD  09/30/2021 11:03 AM

## 2021-10-02 ENCOUNTER — Ambulatory Visit: Payer: Medicare Other

## 2021-10-03 ENCOUNTER — Telehealth: Payer: Self-pay

## 2021-10-03 DIAGNOSIS — F410 Panic disorder [episodic paroxysmal anxiety] without agoraphobia: Secondary | ICD-10-CM

## 2021-10-03 MED ORDER — DULOXETINE HCL 60 MG PO CPEP: ORAL_CAPSULE | ORAL | 1 refills | Status: DC

## 2021-10-03 NOTE — Telephone Encounter (Signed)
received fax requesting a refill on the duloxetine hcl dr. '60mg'$ 

## 2021-10-03 NOTE — Telephone Encounter (Signed)
I have sent Cymbalta to pharmacy. 

## 2021-10-06 ENCOUNTER — Encounter (INDEPENDENT_AMBULATORY_CARE_PROVIDER_SITE_OTHER): Payer: Self-pay | Admitting: Vascular Surgery

## 2021-10-09 ENCOUNTER — Ambulatory Visit (INDEPENDENT_AMBULATORY_CARE_PROVIDER_SITE_OTHER): Payer: Medicare Other

## 2021-10-09 DIAGNOSIS — Z86718 Personal history of other venous thrombosis and embolism: Secondary | ICD-10-CM

## 2021-10-09 LAB — POCT INR
INR: 2.4 (ref 2.0–3.0)
PT: 28.7

## 2021-11-06 ENCOUNTER — Ambulatory Visit (INDEPENDENT_AMBULATORY_CARE_PROVIDER_SITE_OTHER): Payer: Medicare Other

## 2021-11-06 ENCOUNTER — Telehealth: Payer: Self-pay

## 2021-11-06 DIAGNOSIS — Z86718 Personal history of other venous thrombosis and embolism: Secondary | ICD-10-CM

## 2021-11-06 LAB — POCT INR
INR: 2.8 (ref 2.0–3.0)
PT: 33.6

## 2021-11-06 MED ORDER — SCOPOLAMINE 1 MG/3DAYS TD PT72: 1.0000 | MEDICATED_PATCH | TRANSDERMAL | 0 refills | Status: DC

## 2021-11-06 NOTE — Addendum Note (Signed)
Addended by: Birdie Sons on: 11/06/2021 02:18 PM   Modules accepted: Orders

## 2021-11-06 NOTE — Patient Instructions (Signed)
Description   5 mg daily and 10 mg on M and F F/U 4 weeks.

## 2021-11-06 NOTE — Telephone Encounter (Signed)
Patient is going on a cruise in [place] for a week and would like to get Rx for motion sickness   [facility]

## 2021-11-09 DIAGNOSIS — Z23 Encounter for immunization: Secondary | ICD-10-CM | POA: Diagnosis not present

## 2021-11-12 ENCOUNTER — Encounter: Payer: Self-pay | Admitting: Psychiatry

## 2021-11-12 ENCOUNTER — Telehealth (INDEPENDENT_AMBULATORY_CARE_PROVIDER_SITE_OTHER): Payer: Medicare Other | Admitting: Psychiatry

## 2021-11-12 DIAGNOSIS — F418 Other specified anxiety disorders: Secondary | ICD-10-CM

## 2021-11-12 DIAGNOSIS — F1010 Alcohol abuse, uncomplicated: Secondary | ICD-10-CM | POA: Diagnosis not present

## 2021-11-12 DIAGNOSIS — F0631 Mood disorder due to known physiological condition with depressive features: Secondary | ICD-10-CM | POA: Diagnosis not present

## 2021-11-12 DIAGNOSIS — F40298 Other specified phobia: Secondary | ICD-10-CM

## 2021-11-12 DIAGNOSIS — G2401 Drug induced subacute dyskinesia: Secondary | ICD-10-CM

## 2021-11-12 DIAGNOSIS — F17200 Nicotine dependence, unspecified, uncomplicated: Secondary | ICD-10-CM | POA: Diagnosis not present

## 2021-11-12 DIAGNOSIS — F09 Unspecified mental disorder due to known physiological condition: Secondary | ICD-10-CM

## 2021-11-12 MED ORDER — CLONAZEPAM 0.5 MG PO TABS: 0.5000 mg | ORAL_TABLET | ORAL | 0 refills | Status: DC

## 2021-11-12 NOTE — Progress Notes (Unsigned)
Virtual Visit via Video Note  I connected with Chase [name] on 11/12/21 at  4:00 PM EDT by a video enabled telemedicine application and verified that I am speaking with the correct person using two identifiers.  Location Provider Location : [facility] Patient Location : Home  Participants: Patient , Spouse,Provider    I discussed the limitations of evaluation and management by telemedicine and the availability of in person appointments. The patient expressed understanding and agreed to proceed.  I discussed the assessment and treatment plan with the patient. The patient was provided an opportunity to ask questions and all were answered. The patient agreed with the plan and demonstrated an understanding of the instructions.   The patient was advised to call back or seek an in-person evaluation if the symptoms worsen or if the condition fails to improve as anticipated.   [facility] MD OP Progress Note  11/13/2021 12:54 PM Chase [name]  MRN:  [id number]  Chief Complaint:  Chief Complaint  Patient presents with   Follow-up: 71 year old Caucasian male with history of depression, panic attacks, cognitive disorder, alcohol and tobacco use disorder presented for medication management.   HPI: Chase [name] is a 71 year old Caucasian male who has a history of depression, panic attacks, cognitive disorder, alcohol use disorder, tobacco use disorder was evaluated by telemedicine today.  Patient being a limited historian, spouse provided collateral information.  Patient today appeared to be alert, oriented to person situation.  Patient reports overall he is currently doing well with regards to his mood.  Currently has several projects going on, trying to fix his deck which has been frustrating however that keeps him distracted and busy.  That has been helpful with managing his mood better since he has something to do every day.  Patient reports sleep is good.  He does wake up anxious in the morning  however that does not last too long and he feels better after a few minutes.  Patient denies any sadness, hopelessness or anhedonia.  Denies any suicidality, homicidality.  Denies any perceptual disturbances.  As per collateral information obtained from wife-Chase [name]-they are planning to take a trip to [place] and [place].  Patient does have a fear of flying, does get anxious when he is on the flight especially long flights.  Would like to get something for his anxiety.  Discussed clonazepam, agreeable with the same.  Currently reports otherwise patient is doing well.  Denies any side effects to any of his medications.  Tolerating the lower dosage of BuSpar 7.5 mg which she takes in divided dosage.  Denies any other concerns today.  Visit Diagnosis:    ICD-10-CM   1. 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 Other specified anxiety disorders  F41.8 clonazePAM (KLONOPIN) 0.5 MG tablet   Limited symptom attack    3. Tobacco use disorder  F17.200     4. Alcohol use disorder, mild, abuse  F10.10     5. Tardive dyskinesia  G24.01     6. Mild cognitive disorder  F09     7. Specific phobia  F40.298    Flying      Past Psychiatric History: Reviewed past psychiatric history from progress note on 08/10/2017.  Past trials of medications-Effexor, Wellbutrin, Cymbalta, Xanax, Klonopin.  Past Medical History:  Past Medical History:  Diagnosis Date   Cerebrovascular accident (CVA) ([place]) 10/09/2011   Clotting disorder ([place])    DVT (deep  venous thrombosis) ([facility])    Hearing deficit    History of ischemic vertebrobasilar artery thalamic stroke 2001   History of stroke    Hyperlipidemia    Hypertension    Patent foramen ovale    Right knee DJD    Stroke ([place]) 2001    Past Surgical History:  Procedure Laterality Date   APPENDECTOMY     FRACTURE SURGERY     R leg   INCISION AND DRAINAGE Right 06/30/2012   Procedure: INCISION  AND DRAINAGE;  Surgeon: Lorn Junes, MD;  Location: [facility];  Service: Orthopedics;  Laterality: Right;   KNEE ARTHROSCOPY     right knee   KNEE BURSECTOMY Right 06/30/2012   Procedure: KNEE BURSECTOMY;  Surgeon: Lorn Junes, MD;  Location: [facility];  Service: Orthopedics;  Laterality: Right;  Pre-patellar with wound closure   TOTAL KNEE ARTHROPLASTY  01/26/2012   Procedure: TOTAL KNEE ARTHROPLASTY;  Surgeon: Lorn Junes, MD;  Location: [facility];  Service: Orthopedics;  Laterality: Right;    Family Psychiatric History: Reviewed family psychiatric history from progress note on 08/10/2017.  Family History:  Family History  Problem Relation Age of Onset   Cancer Father    Seizures Brother     Social History: Reviewed social history from progress note on 08/10/2017. Social History   Socioeconomic History   Marital status: Married    Spouse name: Chase [name]   Number of children: 0   Years of education: 12   Highest education level: Master's degree (e.g., MA, MS, MEng, MEd, MSW, MBA)  Occupational History   Occupation: retired   Occupation: disabled @ age 36  Tobacco Use   Smoking status: Heavy Smoker    Packs/day: 2.00    Years: 49.00    Total pack years: 98.00    Types: Cigarettes    Last attempt to quit: 06/13/2016    Years since quitting: 5.4   Smokeless tobacco: Never   Tobacco comments:    started smoking at age 89  Vaping Use   Vaping Use: Never used  Substance and Sexual Activity   Alcohol use: Yes    Alcohol/week: 28.0 - 42.0 standard drinks of alcohol    Types: 28 - 42 Cans of beer per week    Comment: 4-6 beers a day   Drug use: No   Sexual activity: Not on file  Other Topics Concern   Not on file  Social History Narrative   Not on file   Social Determinants of Health   Financial Resource Strain: Low Risk  (06/19/2020)   Overall Financial Resource Strain (CARDIA)    Difficulty of Paying Living Expenses: Not hard at all  Food Insecurity: No Food Insecurity  (06/19/2020)   Hunger Vital Sign    Worried About Running Out of Food in the Last Year: Never true    Ran Out of Food in the Last Year: Never true  Transportation Needs: No Transportation Needs (06/19/2020)   PRAPARE - Hydrologist (Medical): No    Lack of Transportation (Non-Medical): No  Physical Activity: Inactive (06/19/2020)   Exercise Vital Sign    Days of Exercise per Week: 0 days    Minutes of Exercise per Session: 0 min  Stress: No Stress Concern Present (06/19/2020)   [place]    Feeling of Stress : Not at all  Social Connections: Moderately Isolated (06/19/2020)   Social Connection and Isolation Panel [  NHANES]    Frequency of Communication with Friends and Family: Once a week    Frequency of Social Gatherings with Friends and Family: More than three times a week    Attends Religious Services: Never    Marine scientist or Organizations: No    Attends Archivist Meetings: Never    Marital Status: Married    Allergies:  Allergies  Allergen Reactions   Chantix [Varenicline Tartrate]     confusion    Metabolic Disorder Labs: No results found for: "HGBA1C", "MPG" No results found for: "PROLACTIN" Lab Results  Component Value Date   CHOL 184 06/25/2021   TRIG 66 06/25/2021   HDL 60 06/25/2021   CHOLHDL 3.1 06/25/2021   LDLCALC 112 (H) 06/25/2021   LDLCALC 102 (H) 07/04/2020   Lab Results  Component Value Date   TSH 2.25 03/13/2017    Therapeutic Level Labs: No results found for: "LITHIUM" No results found for: "VALPROATE" No results found for: "CBMZ"  Current Medications: Current Outpatient Medications  Medication Sig Dispense Refill   amLODipine-benazepril (LOTREL) 5-10 MG capsule TAKE 1 CAPSULE EVERY DAY (NEED MD APPOINTMENT) 90 capsule 4   busPIRone (BUSPAR) 7.5 MG tablet Take 1 tablet (7.5 mg total) by mouth as directed. Take half tablet twice a  day (Patient taking differently: Take 7.5 mg by mouth as directed. Take once daily) 180 tablet 1   clonazePAM (KLONOPIN) 0.5 MG tablet Take 1 tablet (0.5 mg total) by mouth as directed. Take 1 tablet as needed 20 minutes before a flight 4 tablet 0   DULoxetine (CYMBALTA) 60 MG capsule TAKE 1 CAPSULE BY MOUTH EVERY DAY 90 capsule 1   fluticasone (FLONASE) 50 MCG/ACT nasal spray Place 1 spray into both nostrils as needed.      hydrochlorothiazide (HYDRODIURIL) 25 MG tablet TAKE 1 TABLET (25 MG TOTAL) BY MOUTH DAILY. 90 tablet 4   hydrOXYzine (VISTARIL) 25 MG capsule Take 1 capsule (25 mg total) by mouth daily as needed for anxiety (for sever panic sx). 90 capsule 1   montelukast (SINGULAIR) 10 MG tablet TAKE 1 TABLET DAILY FOR COUGH, NASAL DRAINAGE AND ALLERGIES 90 tablet 3   warfarin (COUMADIN) 5 MG tablet TAKE 1 OR 2 TABLETS (5 OR '10MG'$ ) DAILY AS DIRECTED BY PHYSICIAN 180 tablet 4   amoxicillin (AMOXIL) 500 MG tablet Take by mouth as directed. 1 hour prior to dental procedures (Patient not taking: Reported on 11/12/2021)     scopolamine (TRANSDERM-SCOP) 1 MG/3DAYS Place 1 patch (1.5 mg total) onto the skin every 3 (three) days. (Patient not taking: Reported on 11/12/2021) 4 patch 0   No current facility-administered medications for this visit.     Musculoskeletal: Strength & Muscle Tone:  UTA Gait & Station:  Normal Patient leans: N/A  Psychiatric Specialty Exam: Review of Systems  Psychiatric/Behavioral: Negative.    All other systems reviewed and are negative.   There were no vitals taken for this visit.There is no height or weight on file to calculate BMI.  General Appearance: Casual  Eye Contact:  Fair  Speech:  Normal Rate  Volume:  Normal  Mood:  Euthymic  Affect:  Congruent  Thought Process:  Goal Directed and Descriptions of Associations: Intact  Orientation:  Other:  self, situation  Thought Content: Logical   Suicidal Thoughts:  No  Homicidal Thoughts:  No  Memory:   Immediate;   Fair Recent;   Limited Remote;   Limited  Judgement:  Fair  Insight:  Fair  Psychomotor Activity:  Normal  Concentration:  Concentration: Fair and Attention Span: Fair  Recall:   limited  Fund of Knowledge: Fair  Language: Fair  Akathisia:  No  Handed:  Right  AIMS (if indicated): done  Assets:  Communication Skills Desire for Improvement Housing Intimacy Social Support Transportation  ADL's:  Intact  Cognition: Baseline  Sleep:  Fair   Screenings: AIMS    Flowsheet Row Video Visit from 05/20/2021 in [facility] Video Visit from 01/21/2021 in [facility] Office Visit from 10/16/2020 in [facility] Total Score '2 2 2      '$ AUDIT    [facility] Visit from 06/25/2021 in [facility] Visit from 07/04/2020 in [facility] from 06/19/2020 in [facility] from 04/25/2019 in [facility]  Alcohol Use Disorder Identification Test Final Score (AUDIT) '11 12 13 10      '$ GAD-7    Flowsheet Row Video Visit from 05/20/2021 in [facility] Office Visit from 10/16/2020 in [facility] Video Visit from 07/23/2020 in [facility] Visit from 02/23/2019 in [facility]  Total GAD-7 Score '4 3 1 4      '$ PHQ2-9    Flowsheet Row Video Visit from 11/12/2021 in [facility] Video Visit from 08/08/2021 in [facility] Visit from 06/25/2021 in [facility] Video Visit from 05/20/2021 in [facility] Visit from 10/16/2020 in [facility]  PHQ-2 Total Score 0 0 0 0 0  PHQ-9 Total Score -- -- 0 -- 0      Flowsheet Row Video Visit from 11/12/2021 in  [facility] Office Visit from 10/16/2020 in [facility] No Risk No Risk        Assessment and Plan: Chase [name] is a 71 year old Caucasian male who has a history of depression, panic attacks, cognitive impairment, alcohol use disorder, tobacco use disorder was evaluated by telemedicine today.  Patient with cognitive impairment, left-sided thalamic stroke, currently stable on medications.  Patient however is currently planning to take a long flight and is interested in medication to help with his flight anxiety.  Plan as noted below.  Plan Depression in remission Cymbalta 60 mg p.o. daily Continue BuSpar 7.5 mg, half tablet twice a day.  Other specified anxiety disorder with limited symptom attack-stable BuSpar as prescribed Cymbalta 60 mg p.o. daily  Alcohol use disorder mild-unstable Provided counseling  Tobacco use disorder-unstable Will monitor closely  Tardive dyskinesia-chronic-Will monitor closely  Specific phobia-flying-unstable Start clonazepam 0.5 mg right before a flight-provided four pills. Reviewed [place] aware Provided education about  side effects, advised not to mix it with alcohol.  Collateral information obtained from wife as noted above.  Follow-up in clinic in 4 months or sooner if needed.   Consent: Patient/Guardian gives verbal consent for treatment and assignment of benefits for services provided during this visit. Patient/Guardian expressed understanding and agreed to proceed.   This note was generated in part or whole with voice recognition software. Voice recognition is usually quite accurate but there are transcription errors that can and very often do occur. I apologize for any typographical errors that were not detected and corrected.      Ursula Alert, MD 11/13/2021, 12:54 PM

## 2021-11-20 ENCOUNTER — Telehealth: Payer: Self-pay

## 2021-11-20 ENCOUNTER — Other Ambulatory Visit: Payer: Self-pay | Admitting: Psychiatry

## 2021-11-20 DIAGNOSIS — F17200 Nicotine dependence, unspecified, uncomplicated: Secondary | ICD-10-CM

## 2021-11-20 MED ORDER — HYDROXYZINE PAMOATE 25 MG PO CAPS: 25.0000 mg | ORAL_CAPSULE | Freq: Every day | ORAL | 0 refills | Status: AC | PRN

## 2021-11-20 NOTE — Telephone Encounter (Signed)
[facility] a [facility] for the hydroxyzine pam '25mg'$   Pt last seen on 11-12-21 next appt 03-11-22   hydrOXYzine (VISTARIL) 25 MG capsule Medication Date: 05/20/2021 Department: [facility] Ordering/Authorizing: Ursula Alert, MD   Order Providers  Prescribing Provider Encounter Provider  Ursula Alert, MD Ursula Alert, MD   Outpatient Medication Detail   Disp Refills Start End   hydrOXYzine (VISTARIL) 25 MG capsule 90 capsule 1 05/20/2021    Sig - Route: Take 1 capsule (25 mg total) by mouth daily as needed for anxiety (for sever panic sx). - Oral   Sent to pharmacy as: hydrOXYzine (VISTARIL) 25 MG capsule   E-Prescribing Status: Receipt confirmed by pharmacy (05/20/2021  9:12 AM EST)

## 2021-11-20 NOTE — Telephone Encounter (Signed)
Ordered

## 2021-11-21 ENCOUNTER — Telehealth: Payer: Medicare Other | Admitting: Psychiatry

## 2021-12-04 ENCOUNTER — Ambulatory Visit: Payer: Medicare Other

## 2021-12-11 ENCOUNTER — Ambulatory Visit (INDEPENDENT_AMBULATORY_CARE_PROVIDER_SITE_OTHER): Payer: Medicare Other

## 2021-12-11 DIAGNOSIS — Z86718 Personal history of other venous thrombosis and embolism: Secondary | ICD-10-CM | POA: Diagnosis not present

## 2021-12-11 LAB — POCT INR
INR: 2.1 (ref 2.0–3.0)
PT: 24.9

## 2021-12-11 NOTE — Patient Instructions (Signed)
Description   5 mg daily and 10 mg on M and F F/U 4 weeks.

## 2022-01-01 ENCOUNTER — Other Ambulatory Visit: Payer: Self-pay | Admitting: Psychiatry

## 2022-01-01 DIAGNOSIS — F17200 Nicotine dependence, unspecified, uncomplicated: Secondary | ICD-10-CM

## 2022-01-08 ENCOUNTER — Ambulatory Visit (INDEPENDENT_AMBULATORY_CARE_PROVIDER_SITE_OTHER): Payer: Medicare Other

## 2022-01-08 DIAGNOSIS — Z86718 Personal history of other venous thrombosis and embolism: Secondary | ICD-10-CM | POA: Diagnosis not present

## 2022-01-08 LAB — POCT INR
INR: 2.8 (ref 2.0–3.0)
PT: 33.7

## 2022-01-25 ENCOUNTER — Other Ambulatory Visit: Payer: Self-pay | Admitting: Family Medicine

## 2022-01-25 DIAGNOSIS — J301 Allergic rhinitis due to pollen: Secondary | ICD-10-CM

## 2022-02-05 ENCOUNTER — Ambulatory Visit (INDEPENDENT_AMBULATORY_CARE_PROVIDER_SITE_OTHER): Payer: Medicare Other

## 2022-02-05 DIAGNOSIS — Z86718 Personal history of other venous thrombosis and embolism: Secondary | ICD-10-CM | POA: Diagnosis not present

## 2022-02-05 LAB — POCT INR
INR: 2.9 (ref 2.0–3.0)
PT: 34.9

## 2022-02-05 NOTE — Patient Instructions (Signed)
Description   5 mg daily and 10 mg on M and F F/U 4 weeks.

## 2022-02-24 ENCOUNTER — Encounter (INDEPENDENT_AMBULATORY_CARE_PROVIDER_SITE_OTHER): Payer: Self-pay

## 2022-02-28 DIAGNOSIS — Z23 Encounter for immunization: Secondary | ICD-10-CM | POA: Diagnosis not present

## 2022-03-05 ENCOUNTER — Ambulatory Visit (INDEPENDENT_AMBULATORY_CARE_PROVIDER_SITE_OTHER): Payer: Medicare Other | Admitting: Physician Assistant

## 2022-03-05 DIAGNOSIS — Z86718 Personal history of other venous thrombosis and embolism: Secondary | ICD-10-CM | POA: Diagnosis not present

## 2022-03-05 LAB — POCT INR
POC INR: 3.2
PT: 38.7

## 2022-03-05 NOTE — Patient Instructions (Signed)
Description   5 mg daily and 10 mg on M and F F/U 3 weeks.

## 2022-03-11 ENCOUNTER — Encounter: Payer: Self-pay | Admitting: Psychiatry

## 2022-03-11 ENCOUNTER — Telehealth (INDEPENDENT_AMBULATORY_CARE_PROVIDER_SITE_OTHER): Payer: Medicare Other | Admitting: Psychiatry

## 2022-03-11 DIAGNOSIS — F09 Unspecified mental disorder due to known physiological condition: Secondary | ICD-10-CM

## 2022-03-11 DIAGNOSIS — F0631 Mood disorder due to known physiological condition with depressive features: Secondary | ICD-10-CM | POA: Diagnosis not present

## 2022-03-11 DIAGNOSIS — F1010 Alcohol abuse, uncomplicated: Secondary | ICD-10-CM

## 2022-03-11 DIAGNOSIS — F17200 Nicotine dependence, unspecified, uncomplicated: Secondary | ICD-10-CM

## 2022-03-11 DIAGNOSIS — F40298 Other specified phobia: Secondary | ICD-10-CM

## 2022-03-11 DIAGNOSIS — F418 Other specified anxiety disorders: Secondary | ICD-10-CM

## 2022-03-11 DIAGNOSIS — G2401 Drug induced subacute dyskinesia: Secondary | ICD-10-CM

## 2022-03-11 MED ORDER — BUSPIRONE HCL 7.5 MG PO TABS: 7.5000 mg | ORAL_TABLET | Freq: Two times a day (BID) | ORAL | 1 refills | Status: DC

## 2022-03-11 NOTE — Progress Notes (Unsigned)
Virtual Visit via Video Note  I connected with Chase [name] on 03/11/22 at 11:00 AM EST by a video enabled telemedicine application and verified that I am speaking with the correct person using two identifiers.  Location Provider Location : [facility] Patient Location : Home  Participants: Patient , Spouse,Provider   I discussed the limitations of evaluation and management by telemedicine and the availability of in person appointments. The patient expressed understanding and agreed to proceed.    I discussed the assessment and treatment plan with the patient. The patient was provided an opportunity to ask questions and all were answered. The patient agreed with the plan and demonstrated an understanding of the instructions.   The patient was advised to call back or seek an in-person evaluation if the symptoms worsen or if the condition fails to improve as anticipated.   [facility] MD OP Progress Note  03/12/2022 9:16 AM Chase [name]  MRN:  [id number]  Chief Complaint:  Chief Complaint  Patient presents with   Follow-up   Anxiety   Depression   Medication Refill   HPI: Chase [name] is a 71 year old Caucasian male who has a history of depression, anxiety attacks, tobacco use disorder, alcohol use disorder mild abuse, tardive dyskinesia was evaluated by telemedicine today.  Patient with cognitive problems, memory loss, and spouse provided collateral information, patient is a limited historian.  As per patient as well as spouse patient since being on the lower dosage of BuSpar 7.5 mg half tablet twice a day as discussed last visit has been getting worse with regards to anxiety.  Patient reports he feels as though he is going " a mile per hour " in the mornings since doing so.  He also tried the clonazepam as prescribed last visit he tried it on a day prior to his flight to make sure he tolerated it well.  Per spouse it was a good idea that he tried it prior to his day of flight since he  had a bad reaction to the clonazepam, had heart palpitation which lasted for several hours.  However it eventually resolved.  He does not want to try that medication anymore.  Currently denies any panic attacks.  Reports sleep is overall okay.  Patient appeared to be alert, oriented to person, situation.  Patient was also able to give his date of birth correctly.  All other information was provided by wife.  Denies any suicidality, homicidality or perceptual disturbances.  Agreeable to retrial of BuSpar at a higher dosage since he is not tolerating the lower dosage.  Denies any other concerns today.  Visit Diagnosis:    ICD-10-CM   1. 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 Other specified anxiety disorders  F41.8    Limited symptom attacks    3. Tobacco use disorder  F17.200 busPIRone (BUSPAR) 7.5 MG tablet    4. Alcohol use disorder, mild, abuse  F10.10     5. Tardive dyskinesia  G24.01     6. Mild cognitive disorder  F09     7. Specific phobia  F40.298    flying      Past Psychiatric History: Reviewed past psychiatric history from progress note on 08/10/2017.  Past trials of medications-Effexor, Wellbutrin, Cymbalta, Xanax, Klonopin.  Past Medical History:  Past Medical History:  Diagnosis Date   Cerebrovascular accident (CVA) ([facility]) 10/09/2011   Clotting disorder ([facility])    DVT (  deep venous thrombosis) ([facility])    Hearing deficit    History of ischemic vertebrobasilar artery thalamic stroke 2001   History of stroke    Hyperlipidemia    Hypertension    Patent foramen ovale    Right knee DJD    Stroke ([facility]) 2001    Past Surgical History:  Procedure Laterality Date   APPENDECTOMY     FRACTURE SURGERY     R leg   INCISION AND DRAINAGE Right 06/30/2012   Procedure: INCISION AND DRAINAGE;  Surgeon: Lorn Junes, MD;  Location: [facility];  Service: Orthopedics;  Laterality: Right;   KNEE ARTHROSCOPY      right knee   KNEE BURSECTOMY Right 06/30/2012   Procedure: KNEE BURSECTOMY;  Surgeon: Lorn Junes, MD;  Location: [facility];  Service: Orthopedics;  Laterality: Right;  Pre-patellar with wound closure   TOTAL KNEE ARTHROPLASTY  01/26/2012   Procedure: TOTAL KNEE ARTHROPLASTY;  Surgeon: Lorn Junes, MD;  Location: [facility];  Service: Orthopedics;  Laterality: Right;    Family Psychiatric History: Reviewed family psychiatric history from progress note on 08/10/2017.  Family History:  Family History  Problem Relation Age of Onset   Cancer Father    Seizures Brother     Social History: Reviewed social history from progress note on 08/10/2017. Social History   Socioeconomic History   Marital status: Married    Spouse name: debra   Number of children: 0   Years of education: 12   Highest education level: Master's degree (e.g., MA, MS, MEng, MEd, MSW, MBA)  Occupational History   Occupation: retired   Occupation: disabled @ age 88  Tobacco Use   Smoking status: Heavy Smoker    Packs/day: 2.00    Years: 49.00    Total pack years: 98.00    Types: Cigarettes    Last attempt to quit: 06/13/2016    Years since quitting: 5.7   Smokeless tobacco: Never   Tobacco comments:    started smoking at age 44  Vaping Use   Vaping Use: Never used  Substance and Sexual Activity   Alcohol use: Yes    Alcohol/week: 28.0 - 42.0 standard drinks of alcohol    Types: 28 - 42 Cans of beer per week    Comment: 4-6 beers a day   Drug use: No   Sexual activity: Not on file  Other Topics Concern   Not on file  Social History Narrative   Not on file   Social Determinants of Health   Financial Resource Strain: Low Risk  (06/19/2020)   Overall Financial Resource Strain (CARDIA)    Difficulty of Paying Living Expenses: Not hard at all  Food Insecurity: No Food Insecurity (06/19/2020)   Hunger Vital Sign    Worried About Running Out of Food in the Last Year: Never true    Ran Out of Food in the Last  Year: Never true  Transportation Needs: No Transportation Needs (06/19/2020)   PRAPARE - Hydrologist (Medical): No    Lack of Transportation (Non-Medical): No  Physical Activity: Inactive (06/19/2020)   Exercise Vital Sign    Days of Exercise per Week: 0 days    Minutes of Exercise per Session: 0 min  Stress: No Stress Concern Present (06/19/2020)   [place]    Feeling of Stress : Not at all  Social Connections: Moderately Isolated (06/19/2020)   Social Connection and Isolation  Panel [NHANES]    Frequency of Communication with Friends and Family: Once a week    Frequency of Social Gatherings with Friends and Family: More than three times a week    Attends Religious Services: Never    Marine scientist or Organizations: No    Attends Archivist Meetings: Never    Marital Status: Married    Allergies:  Allergies  Allergen Reactions   Klonopin [Clonazepam] Palpitations   Chantix [Varenicline Tartrate]     confusion    Metabolic Disorder Labs: No results found for: "HGBA1C", "MPG" No results found for: "PROLACTIN" Lab Results  Component Value Date   CHOL 184 06/25/2021   TRIG 66 06/25/2021   HDL 60 06/25/2021   CHOLHDL 3.1 06/25/2021   LDLCALC 112 (H) 06/25/2021   LDLCALC 102 (H) 07/04/2020   Lab Results  Component Value Date   TSH 2.25 03/13/2017    Therapeutic Level Labs: No results found for: "LITHIUM" No results found for: "VALPROATE" No results found for: "CBMZ"  Current Medications: Current Outpatient Medications  Medication Sig Dispense Refill   amLODipine-benazepril (LOTREL) 5-10 MG capsule TAKE 1 CAPSULE EVERY DAY (NEED MD APPOINTMENT) 90 capsule 4   amoxicillin (AMOXIL) 500 MG tablet Take by mouth as directed. 1 hour prior to dental procedures     DULoxetine (CYMBALTA) 60 MG capsule TAKE 1 CAPSULE BY MOUTH EVERY DAY 90 capsule 1   fluticasone  (FLONASE) 50 MCG/ACT nasal spray Place 1 spray into both nostrils as needed.      hydrochlorothiazide (HYDRODIURIL) 25 MG tablet TAKE 1 TABLET (25 MG TOTAL) BY MOUTH DAILY. 90 tablet 4   hydrOXYzine (VISTARIL) 25 MG capsule Take 25 mg by mouth daily as needed for anxiety.     montelukast (SINGULAIR) 10 MG tablet TAKE 1 TABLET DAILY FOR COUGH, NASAL DRAINAGE AND ALLERGIES 90 tablet 3   scopolamine (TRANSDERM-SCOP) 1 MG/3DAYS Place 1 patch (1.5 mg total) onto the skin every 3 (three) days. 4 patch 0   warfarin (COUMADIN) 5 MG tablet TAKE 1 OR 2 TABLETS (5 OR '10MG'$ ) DAILY AS DIRECTED BY PHYSICIAN 180 tablet 4   busPIRone (BUSPAR) 7.5 MG tablet Take 1 tablet (7.5 mg total) by mouth 2 (two) times daily. Take 1 tablet daily AM and half tablet daily PM for now , could change to Buspar 1 tablet twice a day if needed in 2 weeks 180 tablet 1   No current facility-administered medications for this visit.     Musculoskeletal: Strength & Muscle Tone:  UTA Gait & Station:  Seated Patient leans: N/A  Psychiatric Specialty Exam: Review of Systems  Psychiatric/Behavioral:  Positive for decreased concentration. The patient is nervous/anxious.   All other systems reviewed and are negative.   There were no vitals taken for this visit.There is no height or weight on file to calculate BMI.  General Appearance: Casual  Eye Contact:  Fair  Speech:  Clear and Coherent  Volume:  Normal  Mood:  Anxious  Affect:  Full Range  Thought Process:  Goal Directed and Descriptions of Associations: Intact  Orientation:  Other:  self, situation  Thought Content: Logical   Suicidal Thoughts:  No  Homicidal Thoughts:  No  Memory:  Immediate;   Fair Recent;   Poor Remote;   Poor  Judgement:  Fair  Insight:  Fair  Psychomotor Activity:  Normal  Concentration:  Concentration: Fair and Attention Span: Fair  Recall:  Poor  Fund of Knowledge: Fair  Language: Fair  Akathisia:  No  Handed:  Right  AIMS (if indicated):  not done  Assets:  Communication Skills Desire for Improvement Social Support Talents/Skills Transportation  ADL's:  Intact  Cognition: baseline  Sleep:  Fair   Screenings: AIMS    Flowsheet Row Video Visit from 05/20/2021 in [facility] Video Visit from 01/21/2021 in [facility] Office Visit from 10/16/2020 in [facility] Total Score '2 2 2      '$ AUDIT    [facility] Office Visit from 06/25/2021 in [facility] Visit from 07/04/2020 in [facility] from 06/19/2020 in [facility] from 04/25/2019 in [facility]  Alcohol Use Disorder Identification Test Final Score (AUDIT) '11 12 13 10      '$ GAD-7    Flowsheet Row Video Visit from 05/20/2021 in [facility] Office Visit from 10/16/2020 in [facility] Video Visit from 07/23/2020 in [facility] Office Visit from 02/23/2019 in [facility]  Total GAD-7 Score '4 3 1 4      '$ PHQ2-9    Flowsheet Row Video Visit from 11/12/2021 in [facility] Video Visit from 08/08/2021 in [facility] Visit from 06/25/2021 in [facility] Video Visit from 05/20/2021 in [facility] Office Visit from 10/16/2020 in [facility]  PHQ-2 Total Score 0 0 0 0 0  PHQ-9 Total Score -- -- 0 -- 0      Flowsheet Row Video Visit from 03/11/2022 in [facility] Video Visit from 11/12/2021 in [facility] Office Visit from 10/16/2020 in [facility] No Risk No Risk No Risk        Assessment and Plan: Chase [name] is a 71 year old Caucasian  male who has a history of depression, panic attacks, cognitive impairment, left thalamic stroke, alcohol use disorder, tobacco use disorder was evaluated by telemedicine today.  Patient is currently anxious, not tolerating the lower dosage of BuSpar, also had side effects to clonazepam as needed, will benefit from the following plan.  Plan Depression in remission Cymbalta 60 mg p.o. daily Continue BuSpar as prescribed  Other specified anxiety disorder with limited symptom attack-unstable Restart BuSpar 7.5 mg daily in the morning and half tablet of the 7.5 mg at bedtime.  If anxiety is not improved in the next 2 weeks could go back to BuSpar 7.5 mg p.o. twice daily Cymbalta 60 mg p.o. daily Continue hydroxyzine 25 mg daily as needed for severe anxiety attacks-limiting use Discontinue clonazepam due to side effects.  Alcohol use disorder mild-unstable Patient is not ready to quit.  Tobacco use disorder-unstable Will monitor closely  Tardive dyskinesia-chronic-we will monitor closely.  Patient does have a neurologist and could discuss with them also.  Specific phobia-flying-unstable Discontinue clonazepam due to side effects. Continue hydroxyzine as prescribed.  Collateral information obtained from wife as noted above.  Follow-up in clinic in 4 months or sooner in person.    Consent: Patient/Guardian gives verbal consent for treatment and assignment of benefits for services provided during this visit. Patient/Guardian expressed understanding and agreed to proceed.   This note was generated in part or whole with voice recognition software. Voice recognition is usually quite accurate but there are transcription errors that can and very often do occur. I apologize for any typographical errors that were not detected and  corrected.      Ursula Alert, MD 03/12/2022, 9:16 AM

## 2022-03-26 ENCOUNTER — Ambulatory Visit (INDEPENDENT_AMBULATORY_CARE_PROVIDER_SITE_OTHER): Payer: Medicare Other

## 2022-03-26 DIAGNOSIS — Z86718 Personal history of other venous thrombosis and embolism: Secondary | ICD-10-CM | POA: Diagnosis not present

## 2022-03-26 LAB — POCT INR
INR: 2.4 (ref 2.0–3.0)
POC INR: 2.4
PT: 28.4

## 2022-03-26 NOTE — Patient Instructions (Addendum)
Description   5 mg daily, except 10 mg on Monday and Friday F/U 4 weeks.

## 2022-04-02 ENCOUNTER — Other Ambulatory Visit: Payer: Self-pay | Admitting: Psychiatry

## 2022-04-02 DIAGNOSIS — F410 Panic disorder [episodic paroxysmal anxiety] without agoraphobia: Secondary | ICD-10-CM

## 2022-04-23 ENCOUNTER — Ambulatory Visit (INDEPENDENT_AMBULATORY_CARE_PROVIDER_SITE_OTHER): Payer: Medicare Other | Admitting: *Deleted

## 2022-04-23 DIAGNOSIS — Z86718 Personal history of other venous thrombosis and embolism: Secondary | ICD-10-CM

## 2022-04-23 LAB — POCT INR
INR: 2.7 (ref 2.0–3.0)
PT: 32.1

## 2022-04-23 NOTE — Patient Instructions (Signed)
5 mg daily, except 10 mg on Monday and Friday F/U 4 weeks.

## 2022-05-21 ENCOUNTER — Ambulatory Visit (INDEPENDENT_AMBULATORY_CARE_PROVIDER_SITE_OTHER): Payer: Medicare Other

## 2022-05-21 DIAGNOSIS — Z86718 Personal history of other venous thrombosis and embolism: Secondary | ICD-10-CM

## 2022-05-21 LAB — POCT INR
INR: 2.3 (ref 2.0–3.0)
POC INR: 2.3
PT: 28

## 2022-06-11 ENCOUNTER — Ambulatory Visit: Payer: Medicare Other

## 2022-06-16 ENCOUNTER — Telehealth: Payer: Self-pay | Admitting: Family Medicine

## 2022-06-16 NOTE — Telephone Encounter (Signed)
Called patient to schedule Medicare Annual Wellness Visit (AWV). Left message for patient to call back and schedule Medicare Annual Wellness Visit (AWV).  Last date of AWV: 06/25/2021   Please schedule an appointment at any time with [facility].  If any questions, please contact me at [phone].  Thank you ,  [facility] Direct Dial: [phone]

## 2022-06-18 ENCOUNTER — Ambulatory Visit (INDEPENDENT_AMBULATORY_CARE_PROVIDER_SITE_OTHER): Payer: Medicare Other

## 2022-06-18 DIAGNOSIS — Z86718 Personal history of other venous thrombosis and embolism: Secondary | ICD-10-CM | POA: Diagnosis not present

## 2022-06-18 LAB — POCT INR
INR: 3.6 — AB (ref 2.0–3.0)
POC INR: 3.6
PT: 42.7

## 2022-06-18 NOTE — Patient Instructions (Signed)
Description   Hold for 3 days then continue 5 mg daily, except 10 mg on Monday and Friday F/U 2 weeks.

## 2022-06-22 ENCOUNTER — Other Ambulatory Visit: Payer: Self-pay | Admitting: Family Medicine

## 2022-06-23 NOTE — Telephone Encounter (Signed)
Requested medications are due for refill today.  unsure  Requested medications are on the active medications list.  yes  Last refill. 06/19/2021 #180 4 rf  Future visit scheduled.   no  Notes to clinic.  Refill not delegated.    Requested Prescriptions  Pending Prescriptions Disp Refills   warfarin (COUMADIN) 5 MG tablet [Pharmacy Med Name: WARFARIN SODIUM 5 MG Tablet] 180 tablet 3    Sig: TAKE 1 OR 2 TABLETS (5 OR '10MG'$ ) DAILY AS DIRECTED BY PHYSICIAN     Hematology:  Anticoagulants - warfarin Failed - 06/22/2022  2:55 AM      Failed - Manual Review: If patient's warfarin is managed by Anti-Coag team, route request to them. If not, route request to the provider.      Failed - HCT in normal range and within 360 days    Hematocrit  Date Value Ref Range Status  06/25/2021 40.8 37.5 - 51.0 % Final         Failed - Valid encounter within last 3 months    Recent Outpatient Visits           12 months ago Medicare annual wellness visit, subsequent   [place], Donald E, MD   1 year ago Primary hypertension   [place], Donald E, MD   1 year ago Primary hypertension   [place], Donald E, MD   2 years ago Essential hypertension   [place], Donald E, MD   2 years ago Essential hypertension   [place], Donald E, MD              Passed - INR in normal range and within 30 days    POC INR  Date Value Ref Range Status  06/18/2022 3.6  Final   INR  Date Value Ref Range Status  06/18/2022 3.6 (A) 2.0 - 3.0 Final         Passed - Patient is not pregnant

## 2022-06-24 ENCOUNTER — Other Ambulatory Visit: Payer: Self-pay | Admitting: Family Medicine

## 2022-06-24 DIAGNOSIS — I10 Essential (primary) hypertension: Secondary | ICD-10-CM

## 2022-06-24 NOTE — Telephone Encounter (Signed)
Unable to refill per protocol, Rx request is too soon. Last refill 06/24/21 for 90 and 4 refills.  Requested Prescriptions  Pending Prescriptions Disp Refills   hydrochlorothiazide (HYDRODIURIL) 25 MG tablet [Pharmacy Med Name: HYDROCHLOROTHIAZIDE 25 MG Tablet] 90 tablet 3    Sig: TAKE 1 TABLET EVERY DAY     Cardiovascular: Diuretics - Thiazide Failed - 06/24/2022  7:49 AM      Failed - Cr in normal range and within 180 days    Creat  Date Value Ref Range Status  03/13/2017 1.08 0.70 - 1.25 mg/dL Final    Comment:    For patients >35 years of age, the reference limit for Creatinine is approximately 13% higher for people identified as African-American. .    Creatinine, Ser  Date Value Ref Range Status  06/25/2021 0.88 0.76 - 1.27 mg/dL Final         Failed - K in normal range and within 180 days    Potassium  Date Value Ref Range Status  06/25/2021 4.6 3.5 - 5.2 mmol/L Final         Failed - Na in normal range and within 180 days    Sodium  Date Value Ref Range Status  06/25/2021 135 134 - 144 mmol/L Final         Failed - Valid encounter within last 6 months    Recent Outpatient Visits           12 months ago Medicare annual wellness visit, subsequent   [place], Donald E, MD   1 year ago Primary hypertension   [place], Donald E, MD   1 year ago Primary hypertension   [place], Donald E, MD   2 years ago Essential hypertension   [place], Donald E, MD   2 years ago Essential hypertension   [place], MD              Passed - Last BP in normal range    BP Readings from Last 1 Encounters:  09/30/21 131/86

## 2022-07-01 ENCOUNTER — Other Ambulatory Visit: Payer: Self-pay | Admitting: Psychiatry

## 2022-07-01 DIAGNOSIS — F17200 Nicotine dependence, unspecified, uncomplicated: Secondary | ICD-10-CM

## 2022-07-02 ENCOUNTER — Ambulatory Visit (INDEPENDENT_AMBULATORY_CARE_PROVIDER_SITE_OTHER): Payer: Medicare Other | Admitting: Family Medicine

## 2022-07-02 DIAGNOSIS — Z86718 Personal history of other venous thrombosis and embolism: Secondary | ICD-10-CM

## 2022-07-02 LAB — POCT INR
INR: 2.9 (ref 2.0–3.0)
POC INR: 2.9
PT: 35.2

## 2022-07-02 NOTE — Patient Instructions (Signed)
Description   Continue 5 mg daily, except 10 mg on Monday and Friday F/U 3 weeks.

## 2022-07-10 ENCOUNTER — Encounter: Payer: Self-pay | Admitting: Psychiatry

## 2022-07-10 ENCOUNTER — Ambulatory Visit (INDEPENDENT_AMBULATORY_CARE_PROVIDER_SITE_OTHER): Payer: Medicare Other | Admitting: Psychiatry

## 2022-07-10 VITALS — BP 136/83 | HR 71 | Temp 97.5°F | Ht 70.5 in | Wt 213.8 lb

## 2022-07-10 DIAGNOSIS — F418 Other specified anxiety disorders: Secondary | ICD-10-CM

## 2022-07-10 DIAGNOSIS — F09 Unspecified mental disorder due to known physiological condition: Secondary | ICD-10-CM

## 2022-07-10 DIAGNOSIS — F0631 Mood disorder due to known physiological condition with depressive features: Secondary | ICD-10-CM | POA: Diagnosis not present

## 2022-07-10 DIAGNOSIS — F1010 Alcohol abuse, uncomplicated: Secondary | ICD-10-CM | POA: Diagnosis not present

## 2022-07-10 DIAGNOSIS — F17200 Nicotine dependence, unspecified, uncomplicated: Secondary | ICD-10-CM

## 2022-07-10 DIAGNOSIS — G2401 Drug induced subacute dyskinesia: Secondary | ICD-10-CM

## 2022-07-10 MED ORDER — HYDROXYZINE PAMOATE 25 MG PO CAPS: 25.0000 mg | ORAL_CAPSULE | Freq: Every day | ORAL | 1 refills | Status: DC | PRN

## 2022-07-10 MED ORDER — BUSPIRONE HCL 7.5 MG PO TABS: 7.5000 mg | ORAL_TABLET | ORAL | 1 refills | Status: DC

## 2022-07-10 MED ORDER — DULOXETINE HCL 60 MG PO CPEP: 60.0000 mg | ORAL_CAPSULE | Freq: Every day | ORAL | 1 refills | Status: DC

## 2022-07-10 NOTE — Progress Notes (Signed)
[facility] MD OP Progress Note  07/10/2022 6:18 PM ZAHMIR STELLHORN  MRN:  [id number]  Chief Complaint:  Chief Complaint  Patient presents with   Follow-up   Anxiety   Medication Refill   Memory Loss   HPI: Chase [name] is a 72 year old Caucasian male who has a history of depression, anxiety, cognitive disorder, alcohol use disorder, tobacco use disorder was evaluated in office today.  Patient today appeared to be alert, oriented to person and situation.  Patient reported he did not want to do any other memory testing today since he knows he will not be able to answer.  Patient does have a history of cognitive disorder.  Patient today otherwise appeared to be calm, pleasant.  Reports anxiety symptoms as manageable on the current medication regimen.  Denies any depression symptoms.  Reports sleep is good.  Patient denies any suicidality, homicidality or perceptual disturbances.  Patient is compliant on medications as prescribed.  Continues to drink 4 drinks of alcohol daily.  Not interested in cutting back.  Continues to smoke cigarettes, not interested in cutting back.  Patient reports he had a fall recently while he was on vacation, slipped in the shower, reports he does have back pain ongoing since the past several years, the fall may have exacerbated some.  Took an ibuprofen this morning which does help.  He also goes to a chiropractor on and off.  Collateral information obtained from wife over the phone-[name] -who reported that patient is currently doing well on the current medication regimen, currently taking BuSpar 7.5 mg 1 tablet during the day and half tablet at night.  Has been limiting the amount of hydroxyzine.    Visit Diagnosis:    ICD-10-CM   1. 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 Other specified anxiety disorders  F41.8 DULoxetine (CYMBALTA) 60 MG capsule   With limited symptom  attack    3. Tobacco use disorder  F17.200 busPIRone (BUSPAR) 7.5 MG tablet    4. Alcohol use disorder, mild, abuse  F10.10     5. Tardive dyskinesia  G24.01     6. Mild cognitive disorder  F09       Past Psychiatric History: Reviewed past psychiatric history from progress note on 08/10/2017.  Past trials of medications-Effexor, Wellbutrin, Cymbalta, Xanax, Klonopin.  Past Medical History:  Past Medical History:  Diagnosis Date   Cerebrovascular accident (CVA) ([place]) 10/09/2011   Clotting disorder ([place])    DVT (deep venous thrombosis) ([facility])    Hearing deficit    History of ischemic vertebrobasilar artery thalamic stroke 2001   History of stroke    Hyperlipidemia    Hypertension    Patent foramen ovale    Right knee DJD    Stroke ([place]) 2001    Past Surgical History:  Procedure Laterality Date   APPENDECTOMY     FRACTURE SURGERY     R leg   INCISION AND DRAINAGE Right 06/30/2012   Procedure: INCISION AND DRAINAGE;  Surgeon: Lorn Junes, MD;  Location: [facility];  Service: Orthopedics;  Laterality: Right;   KNEE ARTHROSCOPY     right knee   KNEE BURSECTOMY Right 06/30/2012   Procedure: KNEE BURSECTOMY;  Surgeon: Lorn Junes, MD;  Location: [facility];  Service: Orthopedics;  Laterality: Right;  Pre-patellar with wound closure   TOTAL KNEE ARTHROPLASTY  01/26/2012   Procedure: TOTAL KNEE ARTHROPLASTY;  Surgeon: Audree Camel  Noemi Chapel, MD;  Location: [facility];  Service: Orthopedics;  Laterality: Right;    Family Psychiatric History: Reviewed family History from progress note on 08/10/2017.  Family History:  Family History  Problem Relation Age of Onset   Cancer Father    Seizures Brother     Social History: Reviewed social history from progress note on 08/10/2017. Social History   Socioeconomic History   Marital status: Married    Spouse name: debra   Number of children: 0   Years of education: 12   Highest education level: Master's degree (e.g., MA, MS, MEng, MEd, MSW, MBA)   Occupational History   Occupation: retired   Occupation: disabled @ age 47  Tobacco Use   Smoking status: Heavy Smoker    Packs/day: 2.00    Years: 49.00    Additional pack years: 0.00    Total pack years: 98.00    Types: Cigarettes    Last attempt to quit: 06/13/2016    Years since quitting: 6.0   Smokeless tobacco: Never   Tobacco comments:    started smoking at age 26  Vaping Use   Vaping Use: Never used  Substance and Sexual Activity   Alcohol use: Yes    Alcohol/week: 28.0 - 42.0 standard drinks of alcohol    Types: 28 - 42 Cans of beer per week    Comment: 4-6 beers a day   Drug use: No   Sexual activity: Not on file  Other Topics Concern   Not on file  Social History Narrative   Not on file   Social Determinants of Health   Financial Resource Strain: Low Risk  (06/19/2020)   Overall Financial Resource Strain (CARDIA)    Difficulty of Paying Living Expenses: Not hard at all  Food Insecurity: No Food Insecurity (06/19/2020)   Hunger Vital Sign    Worried About Running Out of Food in the Last Year: Never true    Ran Out of Food in the Last Year: Never true  Transportation Needs: No Transportation Needs (06/19/2020)   PRAPARE - Hydrologist (Medical): No    Lack of Transportation (Non-Medical): No  Physical Activity: Inactive (06/19/2020)   Exercise Vital Sign    Days of Exercise per Week: 0 days    Minutes of Exercise per Session: 0 min  Stress: No Stress Concern Present (06/19/2020)   [place]    Feeling of Stress : Not at all  Social Connections: Moderately Isolated (06/19/2020)   Social Connection and Isolation Panel [NHANES]    Frequency of Communication with Friends and Family: Once a week    Frequency of Social Gatherings with Friends and Family: More than three times a week    Attends Religious Services: Never    Marine scientist or Organizations: No     Attends Archivist Meetings: Never    Marital Status: Married    Allergies:  Allergies  Allergen Reactions   Klonopin [Clonazepam] Palpitations   Chantix [Varenicline Tartrate]     confusion    Metabolic Disorder Labs: No results found for: "HGBA1C", "MPG" No results found for: "PROLACTIN" Lab Results  Component Value Date   CHOL 184 06/25/2021   TRIG 66 06/25/2021   HDL 60 06/25/2021   CHOLHDL 3.1 06/25/2021   LDLCALC 112 (H) 06/25/2021   LDLCALC 102 (H) 07/04/2020   Lab Results  Component Value Date   TSH 2.25 03/13/2017  Therapeutic Level Labs: No results found for: "LITHIUM" No results found for: "VALPROATE" No results found for: "CBMZ"  Current Medications: Current Outpatient Medications  Medication Sig Dispense Refill   amLODipine-benazepril (LOTREL) 5-10 MG capsule TAKE 1 CAPSULE EVERY DAY (NEED MD APPOINTMENT) 90 capsule 4   amoxicillin (AMOXIL) 500 MG tablet Take by mouth as directed. 1 hour prior to dental procedures     clonazePAM (KLONOPIN) 0.5 MG tablet Take 0.5 mg by mouth as directed. Take 1 tablet 20 minutes before a flight     fluticasone (FLONASE) 50 MCG/ACT nasal spray Place 1 spray into both nostrils as needed.      hydrochlorothiazide (HYDRODIURIL) 25 MG tablet TAKE 1 TABLET (25 MG TOTAL) BY MOUTH DAILY. 90 tablet 4   montelukast (SINGULAIR) 10 MG tablet TAKE 1 TABLET DAILY FOR COUGH, NASAL DRAINAGE AND ALLERGIES 90 tablet 3   scopolamine (TRANSDERM-SCOP) 1 MG/3DAYS Place 1 patch (1.5 mg total) onto the skin every 3 (three) days. 4 patch 0   warfarin (COUMADIN) 5 MG tablet TAKE 1 OR 2 TABLETS (5 OR '10MG'$ ) DAILY AS DIRECTED BY PHYSICIAN (Patient taking differently: 5 mg. TAKE 1 OR 2 TABLETS (5 OR '10MG'$ ) DAILY AS DIRECTED BY PHYSICIAN 2 tablets MONDAY AND FRIDAY and 1 tab other days) 180 tablet 3   busPIRone (BUSPAR) 7.5 MG tablet Take 1 tablet (7.5 mg total) by mouth as directed. Take 1 tablet daily AM and half tablet daily at nigh 135  tablet 1   [START ON 08/28/2022] DULoxetine (CYMBALTA) 60 MG capsule Take 1 capsule (60 mg total) by mouth daily. 90 capsule 1   hydrOXYzine (VISTARIL) 25 MG capsule Take 1 capsule (25 mg total) by mouth daily as needed for anxiety. 90 capsule 1   No current facility-administered medications for this visit.     Musculoskeletal: Strength & Muscle Tone: within normal limits Gait & Station: normal Patient leans: N/A  Psychiatric Specialty Exam: Review of Systems  Musculoskeletal:  Positive for back pain (Chronic).  Psychiatric/Behavioral: Negative.    All other systems reviewed and are negative.   Blood pressure 136/83, pulse 71, temperature (!) 97.5 F (36.4 C), temperature source Skin, height 5' 10.5" (1.791 m), weight 213 lb 12.8 oz (97 kg).Body mass index is 30.24 kg/m.  General Appearance: Casual  Eye Contact:  Fair  Speech:  Clear and Coherent  Volume:  Normal  Mood:  Euthymic  Affect:  Congruent  Thought Process:  Goal Directed and Descriptions of Associations: Intact  Orientation:  Other:  self, situation  Thought Content: Logical   Suicidal Thoughts:  No  Homicidal Thoughts:  No  Memory:  Immediate;   Fair Recent;   limited Remote;   limited  Judgement:  Fair  Insight:  Shallow  Psychomotor Activity:  Normal  Concentration:  Concentration: Fair and Attention Span: Fair  Recall:  [employer] of Knowledge: Fair  Language: Fair  Akathisia:  No  Handed:  Right  AIMS (if indicated): done  Assets:  Communication Skills Desire for Improvement Housing Social Support  ADL's:  Intact  Cognition: baseline  Sleep:  Fair   Screenings: Administrator, Civil Service Office Visit from 07/10/2022 in [facility] Video Visit from 05/20/2021 in [facility] Video Visit from 01/21/2021 in [facility] Office Visit from 10/16/2020 in [facility]  AIMS Total Score '1 2 2 2      '$ AUDIT  [place] Office Visit from 06/25/2021 in [facility] Office Visit from 07/04/2020 in [facility] from 06/19/2020 in [place] [facility] from 04/25/2019 in [facility]  Alcohol Use Disorder Identification Test Final Score (AUDIT) '11 12 13 10      '$ GAD-7    Flowsheet Row Office Visit from 07/10/2022 in [facility] Video Visit from 05/20/2021 in [facility] Office Visit from 10/16/2020 in [facility] Video Visit from 07/23/2020 in [facility] Office Visit from 02/23/2019 in [facility]  Total GAD-7 Score '1 4 3 1 4      '$ PHQ2-9    [place] Office Visit from 07/10/2022 in [place] Video Visit from 11/12/2021 in [facility] Video Visit from 08/08/2021 in [facility] Office Visit from 06/25/2021 in [facility] Video Visit from 05/20/2021 in [place]  PHQ-2 Total Score 1 0 0 0 0  PHQ-9 Total Score 1 -- -- 0 --      [place] Office Visit from 07/10/2022 in [facility] Video Visit from 03/11/2022 in [facility] Video Visit from 11/12/2021 in [facility] No Risk No Risk No Risk        Assessment and Plan: SEYDINA BAALMAN is a 73 year old Caucasian male who has a history of depression, panic attacks, cognitive impairment, alcohol use disorder, tobacco use disorder  was evaluated in the office today.  Patient with cognitive impairment, left-sided thalamic stroke, currently stable on medications for his anxiety, mood, will benefit from the following plan.  Plan Depression in remission Cymbalta 60 mg p.o. daily Continue BuSpar 7.5 mg 1 tablet daily in the morning and half tablet daily at bedtime.  Per collateral information from spouse patient is currently on this dosage which is helping.  Other specified anxiety disorder with limited symptom attack-stable BuSpar as prescribed Cymbalta 60 mg p.o. daily Hydroxyzine 25 mg daily as needed for severe anxiety attacks only-limiting use.  Alcohol disorder mild-unstable Patient is not ready to quit.  Tobacco use disorder-unstable Not ready to quit  Tardive dyskinesia-chronic-not interested in medication changes.  Will monitor closely     Collaboration of Care: Collaboration of Care: Other collateral information obtained from spouse.  Patient advised to follow up with primary care provider for elevated blood pressure reading.  Patient/Guardian was advised Release of Information must be obtained prior to any record release in order to collaborate their care with an outside provider. Patient/Guardian was advised if they have not already done so to contact the registration department to sign all necessary forms in order for [place] to release information regarding their care.   Consent: Patient/Guardian gives verbal consent for treatment and assignment of benefits for services provided during this visit. Patient/Guardian expressed understanding and agreed to proceed.  Follow-up in clinic in 6 months or sooner if needed.  This note was generated in part or whole with voice recognition software. Voice recognition is usually quite accurate but there are transcription errors that can and very often do occur. I apologize for any typographical errors that were not detected and corrected.     Ursula Alert,  MD 07/10/2022,  6:18 PM

## 2022-07-16 ENCOUNTER — Telehealth: Payer: Self-pay | Admitting: Family Medicine

## 2022-07-16 NOTE — Telephone Encounter (Signed)
[facility] to schedule their annual wellness visit. Appointment made for 07/30/2022.  [facility] Direct Dial: [phone]

## 2022-07-23 ENCOUNTER — Ambulatory Visit (INDEPENDENT_AMBULATORY_CARE_PROVIDER_SITE_OTHER): Payer: Medicare Other

## 2022-07-23 DIAGNOSIS — Z86718 Personal history of other venous thrombosis and embolism: Secondary | ICD-10-CM | POA: Diagnosis not present

## 2022-07-23 LAB — POCT INR
INR: 4.2 — AB (ref 2.0–3.0)
PT: 50.9

## 2022-07-23 NOTE — Patient Instructions (Signed)
Description   Per provider continue 5 mg daily, except 5 mg this Friday and 10 mg on Monday.Then resume dosage as before. F/U 1 weeks.

## 2022-07-30 ENCOUNTER — Ambulatory Visit (INDEPENDENT_AMBULATORY_CARE_PROVIDER_SITE_OTHER): Payer: Medicare Other

## 2022-07-30 VITALS — Ht 70.5 in | Wt 213.0 lb

## 2022-07-30 DIAGNOSIS — Z86718 Personal history of other venous thrombosis and embolism: Secondary | ICD-10-CM

## 2022-07-30 DIAGNOSIS — Z0000 Encounter for general adult medical examination without abnormal findings: Secondary | ICD-10-CM

## 2022-07-30 LAB — POCT INR
INR: 2.9 (ref 2.0–3.0)
POC INR: 2.9
PT: 34.7

## 2022-07-30 NOTE — Progress Notes (Signed)
I connected with  [name] on 07/30/22 by a audio enabled telemedicine application and verified that I am speaking with the correct person using two identifiers.  Patient Location: Home  Provider Location: Office/Clinic  I discussed the limitations of evaluation and management by telemedicine. The patient expressed understanding and agreed to proceed.  Subjective:   Chase [name] is a 72 y.o. male who presents for Medicare Annual/Subsequent preventive examination.  Review of Systems    Cardiac Risk Factors include: advanced age (>46men, >64 women);male gender;sedentary lifestyle;obesity (BMI >30kg/m2);smoking/ tobacco exposure    Objective:    Today's Vitals   07/30/22 1521 07/30/22 1523  Weight: 213 lb (96.6 kg)   Height: 5' 10.5" (1.791 m)   PainSc:  2    Body mass index is 30.13 kg/m.     07/30/2022    3:36 PM 06/19/2020    8:34 AM 04/25/2019    2:06 PM 05/24/2018    4:20 PM 04/19/2018   10:11 AM 04/10/2017   10:57 AM 06/30/2012   12:00 AM  Advanced Directives  Does Patient Have a Medical Advance Directive? Yes Yes Yes  Yes Yes Patient has advance directive, copy not in chart  Type of Advance Directive  [place];Living will [place];Living will  [place];Living will  Living will  Copy of [place] in Chart?  Yes - validated most recent copy scanned in chart (See row information) Yes - validated most recent copy scanned in chart (See row information)  Yes - validated most recent copy scanned in chart (See row information)  Copy requested from family  Pre-existing out of facility DNR order (yellow form or pink MOST form)       No     Information is confidential and restricted. Go to Review Flowsheets to unlock data.    Current Medications (verified) Outpatient Encounter Medications as of 07/30/2022  Medication Sig   amLODipine-benazepril (LOTREL) 5-10 MG capsule TAKE 1 CAPSULE EVERY DAY  (NEED MD APPOINTMENT)   amoxicillin (AMOXIL) 500 MG tablet Take by mouth as directed. 1 hour prior to dental procedures (Patient not taking: Reported on 07/30/2022)   busPIRone (BUSPAR) 7.5 MG tablet Take 1 tablet (7.5 mg total) by mouth as directed. Take 1 tablet daily AM and half tablet daily at nigh   clonazePAM (KLONOPIN) 0.5 MG tablet Take 0.5 mg by mouth as directed. Take 1 tablet 20 minutes before a flight (Patient not taking: Reported on 07/30/2022)   [START ON 08/28/2022] DULoxetine (CYMBALTA) 60 MG capsule Take 1 capsule (60 mg total) by mouth daily.   fluticasone (FLONASE) 50 MCG/ACT nasal spray Place 1 spray into both nostrils as needed.    hydrochlorothiazide (HYDRODIURIL) 25 MG tablet TAKE 1 TABLET (25 MG TOTAL) BY MOUTH DAILY.   hydrOXYzine (VISTARIL) 25 MG capsule Take 1 capsule (25 mg total) by mouth daily as needed for anxiety.   montelukast (SINGULAIR) 10 MG tablet TAKE 1 TABLET DAILY FOR COUGH, NASAL DRAINAGE AND ALLERGIES   scopolamine (TRANSDERM-SCOP) 1 MG/3DAYS Place 1 patch (1.5 mg total) onto the skin every 3 (three) days.   warfarin (COUMADIN) 5 MG tablet TAKE 1 OR 2 TABLETS (5 OR 10MG ) DAILY AS DIRECTED BY PHYSICIAN (Patient taking differently: 5 mg. TAKE 1 OR 2 TABLETS (5 OR 10MG ) DAILY AS DIRECTED BY PHYSICIAN 2 tablets MONDAY AND FRIDAY and 1 tab other days)   No facility-administered encounter medications on file as of 07/30/2022.  Allergies (verified) Klonopin [clonazepam] and Chantix [varenicline tartrate]   History: Past Medical History:  Diagnosis Date   Cerebrovascular accident (CVA) 10/09/2011   Clotting disorder    DVT (deep venous thrombosis)    Hearing deficit    History of ischemic vertebrobasilar artery thalamic stroke 2001   History of stroke    Hyperlipidemia    Hypertension    Patent foramen ovale    Right knee DJD    Stroke 2001   Past Surgical History:  Procedure Laterality Date   APPENDECTOMY     FRACTURE SURGERY     R leg   INCISION AND  DRAINAGE Right 06/30/2012   Procedure: INCISION AND DRAINAGE;  Surgeon: Lorn Junes, MD;  Location: [facility];  Service: Orthopedics;  Laterality: Right;   KNEE ARTHROSCOPY     right knee   KNEE BURSECTOMY Right 06/30/2012   Procedure: KNEE BURSECTOMY;  Surgeon: Lorn Junes, MD;  Location: [facility];  Service: Orthopedics;  Laterality: Right;  Pre-patellar with wound closure   TOTAL KNEE ARTHROPLASTY  01/26/2012   Procedure: TOTAL KNEE ARTHROPLASTY;  Surgeon: Lorn Junes, MD;  Location: [facility];  Service: Orthopedics;  Laterality: Right;   Family History  Problem Relation Age of Onset   Cancer Father    Seizures Brother    Social History   Socioeconomic History   Marital status: Married    Spouse name: debra   Number of children: 0   Years of education: 12   Highest education level: Master's degree (e.g., MA, MS, MEng, MEd, MSW, MBA)  Occupational History   Occupation: retired   Occupation: disabled @ age 41  Tobacco Use   Smoking status: Every Day    Packs/day: 2.00    Years: 49.00    Additional pack years: 0.00    Total pack years: 98.00    Types: Cigarettes    Last attempt to quit: 06/13/2016    Years since quitting: 6.1   Smokeless tobacco: Never   Tobacco comments:    started smoking at age 2; pt had quit but says he started smoking again   Vaping Use   Vaping Use: Never used  Substance and Sexual Activity   Alcohol use: Yes    Alcohol/week: 28.0 - 42.0 standard drinks of alcohol    Types: 28 - 42 Cans of beer per week    Comment: 4-6 beers a day   Drug use: No   Sexual activity: Not on file  Other Topics Concern   Not on file  Social History Narrative   Not on file   Social Determinants of Health   Financial Resource Strain: Low Risk  (07/30/2022)   Overall Financial Resource Strain (CARDIA)    Difficulty of Paying Living Expenses: Not hard at all  Food Insecurity: No Food Insecurity (07/30/2022)   Hunger Vital Sign    Worried About Running Out of Food in the  Last Year: Never true    Ran Out of Food in the Last Year: Never true  Transportation Needs: No Transportation Needs (07/30/2022)   PRAPARE - Hydrologist (Medical): No    Lack of Transportation (Non-Medical): No  Physical Activity: Inactive (07/30/2022)   Exercise Vital Sign    Days of Exercise per Week: 0 days    Minutes of Exercise per Session: 0 min  Stress: No Stress Concern Present (07/30/2022)   [place]    Feeling of  Stress : Not at all  Social Connections: Socially Isolated (07/30/2022)   Social Connection and Isolation Panel [NHANES]    Frequency of Communication with Friends and Family: Once a week    Frequency of Social Gatherings with Friends and Family: Once a week    Attends Religious Services: Never    Marine scientist or Organizations: No    Attends Music therapist: Never    Marital Status: Married    Tobacco Counseling Ready to quit: Not Answered Counseling given: Not Answered Tobacco comments: started smoking at age 27; pt had quit but says he started smoking again   Clinical Intake:  Pre-visit preparation completed: Yes  Pain : 0-10 Pain Score: 2  Pain Type: Chronic pain Pain Location: Back Pain Orientation: Lower Pain Descriptors / Indicators: Burning Pain Onset: More than a month ago Pain Frequency: Intermittent Pain Relieving Factors: ice/alcohol  Pain Relieving Factors: ice/alcohol  BMI - recorded: 30.13 Nutritional Status: BMI > 30  Obese Nutritional Risks: None Diabetes: No  How often do you need to have someone help you when you read instructions, pamphlets, or other written materials from your doctor or pharmacy?: 1 - Never  Diabetic?no  Interpreter Needed?: No  Comments: lives with wife Information entered by :: B.Hadiya Spoerl,LPN   Activities of Daily Living    07/30/2022    3:37 PM  In your present state of health, do you  have any difficulty performing the following activities:  Hearing? 0  Vision? 0  Difficulty concentrating or making decisions? 1  Comment s/p stroke  Walking or climbing stairs? 0  Dressing or bathing? 0  Doing errands, shopping? 0  Preparing Food and eating ? N  Using the Toilet? N  In the past six months, have you accidently leaked urine? N  Do you have problems with loss of bowel control? N  Managing your Medications? N  Managing your Finances? N  Housekeeping or managing your Housekeeping? N    Patient Care Team: Birdie Sons, MD as PCP - General (Family Medicine) Ursula Alert, MD as Consulting Physician (Psychiatry) Agapito Games (Optometry) Delana Meyer, Dolores Lory, MD (Vascular Surgery)  Indicate any recent Medical Services you may have received from other than [facility] providers in the past year (date may be approximate).     Assessment:   This is a routine wellness examination for Navin.  Hearing/Vision screen Hearing Screening - Comments:: Adequate hearing Vision Screening - Comments:: Adequate vision w/glasses Dr Marvel Plan  Dietary issues and exercise activities discussed: Current Exercise Habits: The patient does not participate in regular exercise at present, Exercise limited by: orthopedic condition(s);neurologic condition(s)   Goals Addressed             This Visit's Progress    Quit Smoking   Not on track    Recommend to continue efforts to reduce smoking habits until no longer smoking.       Reduce alcohol intake   On track    Recommend to cut back on alcohol in take by half.       Depression Screen    07/30/2022    3:32 PM 07/10/2022   11:21 AM 11/12/2021    4:15 PM 08/08/2021   10:31 AM 06/25/2021    9:11 AM 05/20/2021    9:07 AM 10/16/2020   10:07 AM  PHQ 2/9 Scores  PHQ - 2 Score 1    0    PHQ- 9 Score     0  Information is confidential and restricted. Go to Review Flowsheets to unlock data.    Fall Risk    07/30/2022     3:25 PM 07/04/2020    8:29 AM 06/19/2020    8:34 AM 05/09/2020    8:56 AM 04/25/2019    2:06 PM  Fall Risk   Falls in the past year? 1 0 0 0 0  Number falls in past yr: 0 0 0 0 0  Injury with Fall? 0 0 0 0 0  Risk for fall due to : No Fall Risks      Follow up Education provided;Falls prevention discussed   Falls evaluation completed     FALL RISK PREVENTION PERTAINING TO THE HOME:  Any stairs in or around the home? Yes  If so, are there any without handrails? Yes  Home free of loose throw rugs in walkways, pet beds, electrical cords, etc? Yes  Adequate lighting in your home to reduce risk of falls? Yes   ASSISTIVE DEVICES UTILIZED TO PREVENT FALLS:  Life alert? No  Use of a cane, walker or w/c? No  Grab bars in the bathroom? Yes  Shower chair or bench in shower? Yes  Elevated toilet seat or a handicapped toilet? Yes   Cognitive Function:        07/30/2022    3:41 PM 04/10/2017   11:02 AM  6CIT Screen  What Year? 0 points 4 points  What month? 0 points 0 points  What time? 0 points 0 points  Count back from 20 0 points 0 points  Months in reverse 0 points 0 points  Repeat phrase 2 points 10 points  Total Score 2 points 14 points    Immunizations Immunization History  Administered Date(s) Administered   Fluad Quad(high Dose 65+) 04/11/2020, 02/20/2021   Influenza Split 01/27/2012   Influenza, High Dose Seasonal PF 12/18/2016, 01/15/2018   Influenza, Quadrivalent, Recombinant, Inj, Pf 01/31/2019   Influenza,inj,Quad PF,6+ Mos 04/06/2015   PFIZER(Purple Top)SARS-COV-2 Vaccination 05/17/2019, 06/07/2019, 02/01/2020   Pneumococcal Conjugate-13 12/18/2016   Pneumococcal Polysaccharide-23 01/27/2012, 05/04/2018   Td 11/23/2015   Tdap 11/10/2005    TDAP status: Up to date  Flu Vaccine status: Up to date  Pneumococcal vaccine status: Up to date  Covid-19 vaccine status: Completed vaccines  Qualifies for Shingles Vaccine? Yes   Zostavax completed No   Shingrix  Completed?: No.    Education has been provided regarding the importance of this vaccine. Patient has been advised to call insurance company to determine out of pocket expense if they have not yet received this vaccine. Advised may also receive vaccine at local pharmacy or Health Dept. Verbalized acceptance and understanding.  Screening Tests Health Maintenance  Topic Date Due   COLONOSCOPY (Pts 45-14yrs Insurance coverage will need to be confirmed)  Never done   Zoster Vaccines- Shingrix (1 of 2) Never done   COVID-19 Vaccine (4 - 2023-24 season) 12/27/2021   Lung Cancer Screening  08/27/2022   INFLUENZA VACCINE  11/27/2022   Medicare Annual Wellness (AWV)  07/30/2023   DTaP/Tdap/Td (3 - Td or Tdap) 11/22/2025   Pneumonia Vaccine 64+ Years old  Completed   Hepatitis C Screening  Completed   HPV VACCINES  Aged Out    Health Maintenance  Health Maintenance Due  Topic Date Due   COLONOSCOPY (Pts 45-29yrs Insurance coverage will need to be confirmed)  Never done   Zoster Vaccines- Shingrix (1 of 2) Never done   COVID-19 Vaccine (4 - 2023-24 season)  12/27/2021   Lung Cancer Screening  08/27/2022    Colorectal cancer screening: Type of screening: Colonoscopy. Completed yes. Repeat every 5-10 years  Lung Cancer Screening: (Low Dose CT Chest recommended if Age 69-80 years, 30 pack-year currently smoking OR have quit w/in 15years.) does not qualify.   Lung Cancer Screening Referral: no  Additional Screening:  Hepatitis C Screening: does not qualify; Completed yes  Vision Screening: Recommended annual ophthalmology exams for early detection of glaucoma and other disorders of the eye. Is the patient up to date with their annual eye exam?  Yes  Who is the provider or what is the name of the office in which the patient attends annual eye exams? Dr Marvel Plan If pt is not established with a provider, would they like to be referred to a provider to establish care? No .   Dental Screening:  Recommended annual dental exams for proper oral hygiene  Community Resource Referral / Chronic Care Management: CRR required this visit?  No   CCM required this visit?  No      Plan:     I have personally reviewed and noted the following in the patient's chart:   Medical and social history Use of alcohol, tobacco or illicit drugs  Current medications and supplements including opioid prescriptions. Patient is not currently taking opioid prescriptions. Functional ability and status Nutritional status Physical activity Advanced directives List of other physicians Hospitalizations, surgeries, and ER visits in previous 12 months Vitals Screenings to include cognitive, depression, and falls Referrals and appointments  In addition, I have reviewed and discussed with patient certain preventive protocols, quality metrics, and best practice recommendations. A written personalized care plan for preventive services as well as general preventive health recommendations were provided to patient.     Roger Shelter, LPN   [id number]   Nurse Notes: The patient states they are doing well and has no concerns or questions at this time.

## 2022-07-30 NOTE — Patient Instructions (Signed)
Description   5 mg daily except 10 mg M - F F/U 2 weeks

## 2022-07-30 NOTE — Patient Instructions (Signed)
Chase [name] , Thank you for taking time to come for your Medicare Wellness Visit. I appreciate your ongoing commitment to your health goals. Please review the following plan we discussed and let me know if I can assist you in the future.   These are the goals we discussed:  Goals      Quit Smoking     Recommend to continue efforts to reduce smoking habits until no longer smoking.       Reduce alcohol intake     Recommend to cut back on alcohol in take by half.        This is a list of the screening recommended for you and due dates:  Health Maintenance  Topic Date Due   Colon Cancer Screening  Never done   Zoster (Shingles) Vaccine (1 of 2) Never done   COVID-19 Vaccine (4 - 2023-24 season) 12/27/2021   Screening for Lung Cancer  08/27/2022   Flu Shot  11/27/2022   Medicare Annual Wellness Visit  07/30/2023   DTaP/Tdap/Td vaccine (3 - Td or Tdap) 11/22/2025   Pneumonia Vaccine  Completed   Hepatitis C Screening: USPSTF Recommendation to screen - Ages 67-79 yo.  Completed   HPV Vaccine  Aged Out    Advanced directives: yes  Conditions/risks identified: none  Next appointment: Follow up in one year for your annual wellness visit. 08/10/2023 @3 :30pm telephone  Preventive Care 65 Years and Older, Male  Preventive care refers to lifestyle choices and visits with your health care provider that can promote health and wellness. What does preventive care include? A yearly physical exam. This is also called an annual well check. Dental exams once or twice a year. Routine eye exams. Ask your health care provider how often you should have your eyes checked. Personal lifestyle choices, including: Daily care of your teeth and gums. Regular physical activity. Eating a healthy diet. Avoiding tobacco and drug use. Limiting alcohol use. Practicing safe sex. Taking low doses of aspirin every day. Taking vitamin and mineral supplements as recommended by your health care  provider. What happens during an annual well check? The services and screenings done by your health care provider during your annual well check will depend on your age, overall health, lifestyle risk factors, and family history of disease. Counseling  Your health care provider may ask you questions about your: Alcohol use. Tobacco use. Drug use. Emotional well-being. Home and relationship well-being. Sexual activity. Eating habits. History of falls. Memory and ability to understand (cognition). Work and work Statistician. Screening  You may have the following tests or measurements: Height, weight, and BMI. Blood pressure. Lipid and cholesterol levels. These may be checked every 5 years, or more frequently if you are over 41 years old. Skin check. Lung cancer screening. You may have this screening every year starting at age 72 if you have a 30-pack-year history of smoking and currently smoke or have quit within the past 15 years. Fecal occult blood test (FOBT) of the stool. You may have this test every year starting at age 53. Flexible sigmoidoscopy or colonoscopy. You may have a sigmoidoscopy every 5 years or a colonoscopy every 10 years starting at age 41. Prostate cancer screening. Recommendations will vary depending on your family history and other risks. Hepatitis C blood test. Hepatitis B blood test. Sexually transmitted disease (STD) testing. Diabetes screening. This is done by checking your blood sugar (glucose) after you have not eaten for a while (fasting). You may have this done every  1-3 years. Abdominal aortic aneurysm (AAA) screening. You may need this if you are a current or former smoker. Osteoporosis. You may be screened starting at age 29 if you are at high risk. Talk with your health care provider about your test results, treatment options, and if necessary, the need for more tests. Vaccines  Your health care provider may recommend certain vaccines, such  as: Influenza vaccine. This is recommended every year. Tetanus, diphtheria, and acellular pertussis (Tdap, Td) vaccine. You may need a Td booster every 10 years. Zoster vaccine. You may need this after age 21. Pneumococcal 13-valent conjugate (PCV13) vaccine. One dose is recommended after age 49. Pneumococcal polysaccharide (PPSV23) vaccine. One dose is recommended after age 83. Talk to your health care provider about which screenings and vaccines you need and how often you need them. This information is not intended to replace advice given to you by your health care provider. Make sure you discuss any questions you have with your health care provider. Document Released: 05/11/2015 Document Revised: 01/02/2016 Document Reviewed: 02/13/2015 Elsevier Interactive Patient Education  2017 [place] Prevention in the Home Falls can cause injuries. They can happen to people of all ages. There are many things you can do to make your home safe and to help prevent falls. What can I do on the outside of my home? Regularly fix the edges of walkways and driveways and fix any cracks. Remove anything that might make you trip as you walk through a door, such as a raised step or threshold. Trim any bushes or trees on the path to your home. Use bright outdoor lighting. Clear any walking paths of anything that might make someone trip, such as rocks or tools. Regularly check to see if handrails are loose or broken. Make sure that both sides of any steps have handrails. Any raised decks and porches should have guardrails on the edges. Have any leaves, snow, or ice cleared regularly. Use sand or salt on walking paths during winter. Clean up any spills in your garage right away. This includes oil or grease spills. What can I do in the bathroom? Use night lights. Install grab bars by the toilet and in the tub and shower. Do not use towel bars as grab bars. Use non-skid mats or decals in the tub or  shower. If you need to sit down in the shower, use a plastic, non-slip stool. Keep the floor dry. Clean up any water that spills on the floor as soon as it happens. Remove soap buildup in the tub or shower regularly. Attach bath mats securely with double-sided non-slip rug tape. Do not have throw rugs and other things on the floor that can make you trip. What can I do in the bedroom? Use night lights. Make sure that you have a light by your bed that is easy to reach. Do not use any sheets or blankets that are too big for your bed. They should not hang down onto the floor. Have a firm chair that has side arms. You can use this for support while you get dressed. Do not have throw rugs and other things on the floor that can make you trip. What can I do in the kitchen? Clean up any spills right away. Avoid walking on wet floors. Keep items that you use a lot in easy-to-reach places. If you need to reach something above you, use a strong step stool that has a grab bar. Keep electrical cords out of the way.  Do not use floor polish or wax that makes floors slippery. If you must use wax, use non-skid floor wax. Do not have throw rugs and other things on the floor that can make you trip. What can I do with my stairs? Do not leave any items on the stairs. Make sure that there are handrails on both sides of the stairs and use them. Fix handrails that are broken or loose. Make sure that handrails are as long as the stairways. Check any carpeting to make sure that it is firmly attached to the stairs. Fix any carpet that is loose or worn. Avoid having throw rugs at the top or bottom of the stairs. If you do have throw rugs, attach them to the floor with carpet tape. Make sure that you have a light switch at the top of the stairs and the bottom of the stairs. If you do not have them, ask someone to add them for you. What else can I do to help prevent falls? Wear shoes that: Do not have high heels. Have  rubber bottoms. Are comfortable and fit you well. Are closed at the toe. Do not wear sandals. If you use a stepladder: Make sure that it is fully opened. Do not climb a closed stepladder. Make sure that both sides of the stepladder are locked into place. Ask someone to hold it for you, if possible. Clearly mark and make sure that you can see: Any grab bars or handrails. First and last steps. Where the edge of each step is. Use tools that help you move around (mobility aids) if they are needed. These include: Canes. Walkers. Scooters. Crutches. Turn on the lights when you go into a dark area. Replace any light bulbs as soon as they burn out. Set up your furniture so you have a clear path. Avoid moving your furniture around. If any of your floors are uneven, fix them. If there are any pets around you, be aware of where they are. Review your medicines with your doctor. Some medicines can make you feel dizzy. This can increase your chance of falling. Ask your doctor what other things that you can do to help prevent falls. This information is not intended to replace advice given to you by your health care provider. Make sure you discuss any questions you have with your health care provider. Document Released: 02/08/2009 Document Revised: 09/20/2015 Document Reviewed: 05/19/2014 Elsevier Interactive Patient Education  2017 [employer].

## 2022-08-07 ENCOUNTER — Encounter: Payer: Self-pay | Admitting: Radiology

## 2022-08-07 ENCOUNTER — Emergency Department: Payer: Medicare Other

## 2022-08-07 ENCOUNTER — Other Ambulatory Visit: Payer: Self-pay

## 2022-08-07 ENCOUNTER — Observation Stay: Payer: Medicare Other

## 2022-08-07 ENCOUNTER — Observation Stay
Admission: EM | Admit: 2022-08-07 | Discharge: 2022-08-08 | Disposition: A | Discharge status: Home / Self Care | Payer: Medicare Other | Attending: Internal Medicine | Admitting: Internal Medicine

## 2022-08-07 DIAGNOSIS — G9341 Metabolic encephalopathy: Secondary | ICD-10-CM | POA: Diagnosis present

## 2022-08-07 DIAGNOSIS — G459 Transient cerebral ischemic attack, unspecified: Secondary | ICD-10-CM | POA: Diagnosis not present

## 2022-08-07 DIAGNOSIS — I6523 Occlusion and stenosis of bilateral carotid arteries: Secondary | ICD-10-CM | POA: Insufficient documentation

## 2022-08-07 DIAGNOSIS — Z96651 Presence of right artificial knee joint: Secondary | ICD-10-CM | POA: Diagnosis not present

## 2022-08-07 DIAGNOSIS — I700 Atherosclerosis of aorta: Secondary | ICD-10-CM | POA: Insufficient documentation

## 2022-08-07 DIAGNOSIS — R2689 Other abnormalities of gait and mobility: Secondary | ICD-10-CM | POA: Diagnosis not present

## 2022-08-07 DIAGNOSIS — Z7901 Long term (current) use of anticoagulants: Secondary | ICD-10-CM | POA: Diagnosis not present

## 2022-08-07 DIAGNOSIS — F17210 Nicotine dependence, cigarettes, uncomplicated: Secondary | ICD-10-CM | POA: Diagnosis not present

## 2022-08-07 DIAGNOSIS — E871 Hypo-osmolality and hyponatremia: Secondary | ICD-10-CM | POA: Diagnosis not present

## 2022-08-07 DIAGNOSIS — F17200 Nicotine dependence, unspecified, uncomplicated: Secondary | ICD-10-CM

## 2022-08-07 DIAGNOSIS — R4701 Aphasia: Secondary | ICD-10-CM

## 2022-08-07 DIAGNOSIS — I672 Cerebral atherosclerosis: Secondary | ICD-10-CM | POA: Insufficient documentation

## 2022-08-07 DIAGNOSIS — Z79899 Other long term (current) drug therapy: Secondary | ICD-10-CM | POA: Diagnosis not present

## 2022-08-07 DIAGNOSIS — I10 Essential (primary) hypertension: Secondary | ICD-10-CM

## 2022-08-07 DIAGNOSIS — I6621 Occlusion and stenosis of right posterior cerebral artery: Secondary | ICD-10-CM | POA: Insufficient documentation

## 2022-08-07 DIAGNOSIS — J439 Emphysema, unspecified: Secondary | ICD-10-CM | POA: Diagnosis not present

## 2022-08-07 DIAGNOSIS — J432 Centrilobular emphysema: Secondary | ICD-10-CM

## 2022-08-07 DIAGNOSIS — R9431 Abnormal electrocardiogram [ECG] [EKG]: Secondary | ICD-10-CM | POA: Diagnosis not present

## 2022-08-07 DIAGNOSIS — Z86718 Personal history of other venous thrombosis and embolism: Secondary | ICD-10-CM | POA: Diagnosis not present

## 2022-08-07 DIAGNOSIS — F1020 Alcohol dependence, uncomplicated: Secondary | ICD-10-CM

## 2022-08-07 DIAGNOSIS — G9389 Other specified disorders of brain: Secondary | ICD-10-CM | POA: Diagnosis not present

## 2022-08-07 DIAGNOSIS — R29818 Other symptoms and signs involving the nervous system: Secondary | ICD-10-CM | POA: Diagnosis not present

## 2022-08-07 DIAGNOSIS — I493 Ventricular premature depolarization: Secondary | ICD-10-CM | POA: Insufficient documentation

## 2022-08-07 LAB — COMPREHENSIVE METABOLIC PANEL
ALT: 18 U/L (ref 0–44)
AST: 26 U/L (ref 15–41)
Albumin: 4 g/dL (ref 3.5–5.0)
Alkaline Phosphatase: 41 U/L (ref 38–126)
Anion gap: 11 (ref 5–15)
BUN: 10 mg/dL (ref 8–23)
CO2: 23 mmol/L (ref 22–32)
Calcium: 8.7 mg/dL — ABNORMAL LOW (ref 8.9–10.3)
Chloride: 90 mmol/L — ABNORMAL LOW (ref 98–111)
Creatinine, Ser: 0.87 mg/dL (ref 0.61–1.24)
GFR, Estimated: >60 mL/min (ref >60)
Glucose, Bld: 91 mg/dL (ref 70–99)
Potassium: 3.6 mmol/L (ref 3.5–5.1)
Sodium: 124 mmol/L — ABNORMAL LOW (ref 135–145)
Total Bilirubin: 0.7 mg/dL (ref 0.3–1.2)
Total Protein: 6.9 g/dL (ref 6.5–8.1)

## 2022-08-07 LAB — CBC
HCT: 35.4 % — ABNORMAL LOW (ref 39.0–52.0)
Hemoglobin: 12.5 g/dL — ABNORMAL LOW (ref 13.0–17.0)
MCH: 33 pg (ref 26.0–34.0)
MCHC: 35.3 g/dL (ref 30.0–36.0)
MCV: 93.4 fL (ref 80.0–100.0)
Platelets: 177 10*3/uL (ref 150–400)
RBC: 3.79 MIL/uL — ABNORMAL LOW (ref 4.22–5.81)
RDW: 13.7 % (ref 11.5–15.5)
WBC: 4.9 10*3/uL (ref 4.0–10.5)
nRBC: 0 % (ref 0.0–0.2)

## 2022-08-07 LAB — URINALYSIS, COMPLETE (UACMP) WITH MICROSCOPIC
Bacteria, UA: NONE SEEN
Bilirubin Urine: NEGATIVE
Glucose, UA: NEGATIVE mg/dL
Hgb urine dipstick: NEGATIVE
Ketones, ur: 5 mg/dL — AB
Leukocytes,Ua: NEGATIVE
Nitrite: NEGATIVE
Protein, ur: NEGATIVE mg/dL
Specific Gravity, Urine: 1.017 (ref 1.005–1.030)
pH: 6 (ref 5.0–8.0)

## 2022-08-07 LAB — URINE DRUG SCREEN, QUALITATIVE (ARMC ONLY)
Amphetamines, Ur Screen: NOT DETECTED
Barbiturates, Ur Screen: NOT DETECTED
Benzodiazepine, Ur Scrn: NOT DETECTED
Cannabinoid 50 Ng, Ur ~~LOC~~: POSITIVE — AB
Cocaine Metabolite,Ur ~~LOC~~: NOT DETECTED
MDMA (Ecstasy)Ur Screen: NOT DETECTED
Methadone Scn, Ur: NOT DETECTED
Opiate, Ur Screen: NOT DETECTED
Phencyclidine (PCP) Ur S: NOT DETECTED
Tricyclic, Ur Screen: NOT DETECTED

## 2022-08-07 LAB — VITAMIN B12: Vitamin B-12: 192 pg/mL (ref 180–914)

## 2022-08-07 LAB — MAGNESIUM: Magnesium: 1.6 mg/dL — ABNORMAL LOW (ref 1.7–2.4)

## 2022-08-07 LAB — OSMOLALITY: Osmolality: 257 mOsm/kg — ABNORMAL LOW (ref 275–295)

## 2022-08-07 LAB — BASIC METABOLIC PANEL
Anion gap: 9 (ref 5–15)
BUN: 11 mg/dL (ref 8–23)
CO2: 23 mmol/L (ref 22–32)
Calcium: 8.5 mg/dL — ABNORMAL LOW (ref 8.9–10.3)
Chloride: 93 mmol/L — ABNORMAL LOW (ref 98–111)
Creatinine, Ser: 0.8 mg/dL (ref 0.61–1.24)
GFR, Estimated: >60 mL/min (ref >60)
Glucose, Bld: 179 mg/dL — ABNORMAL HIGH (ref 70–99)
Potassium: 3.4 mmol/L — ABNORMAL LOW (ref 3.5–5.1)
Sodium: 125 mmol/L — ABNORMAL LOW (ref 135–145)

## 2022-08-07 LAB — OSMOLALITY, URINE: Osmolality, Ur: 257 mOsm/kg — ABNORMAL LOW (ref 300–900)

## 2022-08-07 LAB — DIFFERENTIAL
Abs Immature Granulocytes: 0.01 10*3/uL (ref 0.00–0.07)
Basophils Absolute: 0 10*3/uL (ref 0.0–0.1)
Basophils Relative: 0 %
Eosinophils Absolute: 0.1 10*3/uL (ref 0.0–0.5)
Eosinophils Relative: 1 %
Immature Granulocytes: 0 %
Lymphocytes Relative: 21 %
Lymphs Abs: 1 10*3/uL (ref 0.7–4.0)
Monocytes Absolute: 0.7 10*3/uL (ref 0.1–1.0)
Monocytes Relative: 14 %
Neutro Abs: 3.1 10*3/uL (ref 1.7–7.7)
Neutrophils Relative %: 64 %

## 2022-08-07 LAB — PROTIME-INR
INR: 2.3 — ABNORMAL HIGH (ref 0.8–1.2)
Prothrombin Time: 25 seconds — ABNORMAL HIGH (ref 11.4–15.2)

## 2022-08-07 LAB — APTT: aPTT: 44 seconds — ABNORMAL HIGH (ref 24–36)

## 2022-08-07 LAB — ETHANOL: Alcohol, Ethyl (B): <10 mg/dL (ref <10)

## 2022-08-07 LAB — CREATININE, URINE, RANDOM: Creatinine, Urine: 19 mg/dL

## 2022-08-07 LAB — CBG MONITORING, ED: Glucose-Capillary: 88 mg/dL (ref 70–99)

## 2022-08-07 LAB — SODIUM, URINE, RANDOM: Sodium, Ur: 56 mmol/L

## 2022-08-07 MED ORDER — LORAZEPAM 1 MG PO TABS: 1.0000 mg | ORAL_TABLET | ORAL | Status: DC | PRN

## 2022-08-07 MED ORDER — STROKE: EARLY STAGES OF RECOVERY BOOK: Freq: Once | Status: AC

## 2022-08-07 MED ORDER — SODIUM CHLORIDE 0.9% FLUSH: 3.0000 mL | Freq: Once | INTRAVENOUS | Status: DC

## 2022-08-07 MED ORDER — ACETAMINOPHEN 160 MG/5ML PO SOLN: 650.0000 mg | ORAL | Status: DC | PRN

## 2022-08-07 MED ORDER — ALBUTEROL SULFATE (2.5 MG/3ML) 0.083% IN NEBU: 2.5000 mg | INHALATION_SOLUTION | RESPIRATORY_TRACT | Status: DC | PRN

## 2022-08-07 MED ORDER — THIAMINE HCL 100 MG/ML IJ SOLN
500.0000 mg | Freq: Three times a day (TID) | INTRAVENOUS | Status: DC
  Administered 2022-08-07 – 2022-08-08 (×3): 500 mg via INTRAVENOUS
  Filled 2022-08-07 (×4): qty 5

## 2022-08-07 MED ORDER — ACETAMINOPHEN 650 MG RE SUPP: 650.0000 mg | RECTAL | Status: DC | PRN

## 2022-08-07 MED ORDER — THIAMINE HCL 100 MG/ML IJ SOLN
100.0000 mg | Freq: Once | INTRAMUSCULAR | Status: AC
  Administered 2022-08-07: 100 mg via INTRAVENOUS
  Filled 2022-08-07: qty 2

## 2022-08-07 MED ORDER — BUSPIRONE HCL 15 MG PO TABS
7.5000 mg | ORAL_TABLET | Freq: Two times a day (BID) | ORAL | Status: DC
  Administered 2022-08-07 – 2022-08-08 (×2): 7.5 mg via ORAL
  Filled 2022-08-07 (×2): qty 1

## 2022-08-07 MED ORDER — SENNOSIDES-DOCUSATE SODIUM 8.6-50 MG PO TABS: 1.0000 | ORAL_TABLET | Freq: Every evening | ORAL | Status: DC | PRN

## 2022-08-07 MED ORDER — SODIUM CHLORIDE 0.9 % IV BOLUS
500.0000 mL | Freq: Once | INTRAVENOUS | Status: AC
  Administered 2022-08-07: 500 mL via INTRAVENOUS

## 2022-08-07 MED ORDER — DULOXETINE HCL 30 MG PO CPEP
60.0000 mg | ORAL_CAPSULE | Freq: Every day | ORAL | Status: DC
  Administered 2022-08-08: 60 mg via ORAL
  Filled 2022-08-07: qty 2

## 2022-08-07 MED ORDER — SODIUM CHLORIDE 0.9 % IV SOLN: INTRAVENOUS | Status: AC

## 2022-08-07 MED ORDER — ADULT MULTIVITAMIN W/MINERALS CH
1.0000 | ORAL_TABLET | Freq: Every day | ORAL | Status: DC
  Administered 2022-08-07 – 2022-08-08 (×2): 1 via ORAL
  Filled 2022-08-07 (×2): qty 1

## 2022-08-07 MED ORDER — MONTELUKAST SODIUM 10 MG PO TABS
10.0000 mg | ORAL_TABLET | Freq: Every day | ORAL | Status: DC
  Administered 2022-08-07: 10 mg via ORAL
  Filled 2022-08-07: qty 1

## 2022-08-07 MED ORDER — LORAZEPAM 2 MG/ML IJ SOLN: 1.0000 mg | INTRAMUSCULAR | Status: DC | PRN

## 2022-08-07 MED ORDER — ACETAMINOPHEN 325 MG PO TABS: 650.0000 mg | ORAL_TABLET | ORAL | Status: DC | PRN

## 2022-08-07 MED ORDER — IOHEXOL 350 MG/ML SOLN
100.0000 mL | Freq: Once | INTRAVENOUS | Status: AC | PRN
  Administered 2022-08-07: 100 mL via INTRAVENOUS

## 2022-08-07 MED ORDER — FOLIC ACID 1 MG PO TABS
1.0000 mg | ORAL_TABLET | Freq: Every day | ORAL | Status: DC
  Administered 2022-08-07 – 2022-08-08 (×2): 1 mg via ORAL
  Filled 2022-08-07 (×2): qty 1

## 2022-08-07 NOTE — ED Notes (Signed)
Called [facility] activated Code Stroke

## 2022-08-07 NOTE — ED Triage Notes (Signed)
ARrives with aphasia.  Wife spoke with patient 73 and patient was at baseline.  At 1249 waife called patient and aphasia was noted.

## 2022-08-07 NOTE — Assessment & Plan Note (Signed)
Patient presenting with sodium of 124 and serum osmolality of 257, in the setting of daily alcohol use, concerning for beer potomania.  Differential also includes SIADH in the setting of nearly 120-pack-year history although CT chest in May 2023 demonstrated stable pulmonary nodules.  - S/p 500 cc normal saline bolus - Continue very gentle maintenance fluids at 50 cc/h given high risk with concurrent alcohol use disorder - BMP every 6 hours - Maximum goal sodium in the next 24 hours: 130 - Urine sodium and urine osmolality pending

## 2022-08-07 NOTE — Assessment & Plan Note (Addendum)
-   Hold home antihypertensives in the setting of permissive hypertension 

## 2022-08-07 NOTE — Code Documentation (Addendum)
Stroke Response Nurse Documentation Code Documentation  Chase [name] is a 72 y.o. male arriving to [facility] via [employer] on 08/07/2022 with past medical hx of ischemic thalamic stroke with only deficit of short term memory loss, per wife, PFO, DVT, HTN, hearing deficit, HLD, current smoker. On warfarin daily. Code stroke was activated by ED.   Patient from home where he was LKW at 1100 and now complaining of aphasia. Patient's wife reports at 1200 he seemed slightly confused. At 1249, he was unable to say anything that made sense and brought him to the hospital for evaluation.    Stroke team with patient and staff to CT after code stroke activation. Patient cleared for CT by Dr. Modesto Charon. Patient to CT with team. NIHSS 6, see documentation for details and code stroke times. Patient with disoriented, not following commands, and Receptive aphasia  on exam. The following imaging was completed:  CT Head, CTA, and CTP. Patient is not a candidate for IV Thrombolytic due to symptoms resolving and INR 2.3, per MD  Patient is not a candidate for IR due to no LVO on Imaging per MD. Patient's aphasia has improved after imaging complete.   Patient and wife educated at bedside on stroke symptoms and importance of calling 911.    Care Plan: q2h NIHSS and vital signs, swallow screen per order.   Bedside handoff with ED RN Aundra Millet.    Wille Glaser  Stroke Response RN

## 2022-08-07 NOTE — ED Notes (Signed)
Pt gone to MRI 

## 2022-08-07 NOTE — Progress Notes (Signed)
This Chap responded to Code Stroke at the waiting area, Pt being observed by care team. No further Spiritual or emotional care needed at the moment. Kindly page the [facility] if need arise.   08/07/22 1603  Spiritual Encounters  Type of Visit Initial  Care provided to: Pt and family  Referral source Nurse (RN/NT/LPN)  Reason for visit Code  OnCall Visit Yes  Advance Directives (For Healthcare)  Does Patient Have a Medical Advance Directive? Yes  Mental Health Advance Directives  Does Patient Have a Mental Health Advance Directive? Yes

## 2022-08-07 NOTE — Assessment & Plan Note (Addendum)
Patient states he has been told in the last year that he has COPD. He denies any symptoms at this time, despite his 120-pack-year smoking history.  Significant wheezing and rhonchi heard on examination.  Given lack of symptoms, will start with as needed albuterol only.  - Albuterol as needed for wheezing or shortness of breath - Patient not interested in tobacco use cessation

## 2022-08-07 NOTE — H&P (Signed)
History and Physical    [name]:[id number] DOB: 01-06-1951 DOA: 08/07/2022 DOS: the patient was seen and examined on 08/07/2022 PCP: [name], MD  Patient coming from: Home  Chief Complaint:  Chief Complaint  Patient presents with   Aphasia   Code Stroke   HPI: Chase [name] is a 72 y.o. male with medical history significant of CVA due to DVT with PFO on warfarin, alcohol use disorder, hypertension, hyperlipidemia, depression/anxiety, tardive dyskinesia, who presents to the ED due to difficulty speaking.  Chase [name] states that he was in his normal state of health this morning at approximately 1230 pm, when he went to take a shower.  He started feeling unwell, and diaphoretic.  His wife arrived home approximately 15 to 20 minutes later and noted that he was unable to answer her questions appropriately.  She stated he was slurring his words and the words he were saying were nonsensical.  She did not notice any facial droop or focal weakness.  On the way to the ER, she noticed that his symptoms were gradually improving.  Chase [name] denies any recent fever, chills, nausea, vomiting, diarrhea, abdominal pain, chest pain, shortness of breath, palpitations.  Chase [name] notes that he drinks between 4-6 beers per day generally between 4 PM and 8 PM.  He denies ever experiencing withdrawals in the past but states he has only not drink 1 time when he had the surgery.  Chase [name] states that she has noticed patient's gait has become shuffling over the past several weeks.  ED course: On arrival to the ED, patient was normotensive at 132/88 with heart rate of 87.  He was saturating at 94% on room air.  He was afebrile 98.3.  Initial workup notable for sodium of 124, potassium 3.6, creatinine 0.87, GFR above 60, hemoglobin 12.5, platelets of 177.  INR 2.3.  Neurology consulted due to code stroke.  CTA and CT perfusion study obtained that demonstrated no LVO, however  severe stenosis of the right PCA P3 segment branch.  No mismatch volume reported.  Patient started on IV fluids and thiamine.  TRH contacted for admission.  Review of Systems: As mentioned in the history of present illness. All other systems reviewed and are negative.  Past Medical History:  Diagnosis Date   Cerebrovascular accident (CVA) 10/09/2011   Clotting disorder    DVT (deep venous thrombosis)    Hearing deficit    History of ischemic vertebrobasilar artery thalamic stroke 2001   History of stroke    Hyperlipidemia    Hypertension    Patent foramen ovale    Right knee DJD    Stroke 2001   Past Surgical History:  Procedure Laterality Date   APPENDECTOMY     FRACTURE SURGERY     R leg   INCISION AND DRAINAGE Right 06/30/2012   Procedure: INCISION AND DRAINAGE;  Surgeon: Nilda Simmer, MD;  Location: [facility] OR;  Service: Orthopedics;  Laterality: Right;   KNEE ARTHROSCOPY     right knee   KNEE BURSECTOMY Right 06/30/2012   Procedure: KNEE BURSECTOMY;  Surgeon: Nilda Simmer, MD;  Location: [facility] OR;  Service: Orthopedics;  Laterality: Right;  Pre-patellar with wound closure   TOTAL KNEE ARTHROPLASTY  01/26/2012   Procedure: TOTAL KNEE ARTHROPLASTY;  Surgeon: Nilda Simmer, MD;  Location: [facility] OR;  Service: Orthopedics;  Laterality: Right;   Social History:  reports that he has been smoking cigarettes. He has a 98.00 pack-year  smoking history. He has never used smokeless tobacco. He reports current alcohol use of about 28.0 - 42.0 standard drinks of alcohol per week. He reports that he does not use drugs.  Allergies  Allergen Reactions   Klonopin [Clonazepam] Palpitations   Chantix [Varenicline Tartrate]     confusion    Family History  Problem Relation Age of Onset   Cancer Father    Seizures Brother     Prior to Admission medications   Medication Sig Start Date End Date Taking? Authorizing Provider  amLODipine-benazepril (LOTREL) 5-10 MG capsule TAKE 1 CAPSULE EVERY DAY  (NEED MD APPOINTMENT) 08/31/21   Malva LimesFisher, Donald E, MD  amoxicillin (AMOXIL) 500 MG tablet Take by mouth as directed. 1 hour prior to dental procedures Patient not taking: Reported on 07/30/2022 01/04/19   [provider]  busPIRone (BUSPAR) 7.5 MG tablet Take 1 tablet (7.5 mg total) by mouth as directed. Take 1 tablet daily AM and half tablet daily at nigh 07/10/22   Jomarie LongsEappen, Saramma, MD  clonazePAM (KLONOPIN) 0.5 MG tablet Take 0.5 mg by mouth as directed. Take 1 tablet 20 minutes before a flight Patient not taking: Reported on 07/30/2022    [provider]  DULoxetine (CYMBALTA) 60 MG capsule Take 1 capsule (60 mg total) by mouth daily. 08/28/22   Jomarie LongsEappen, Saramma, MD  fluticasone (FLONASE) 50 MCG/ACT nasal spray Place 1 spray into both nostrils as needed.  01/17/13   [provider]  hydrochlorothiazide (HYDRODIURIL) 25 MG tablet TAKE 1 TABLET (25 MG TOTAL) BY MOUTH DAILY. 06/24/21   Malva LimesFisher, Donald E, MD  hydrOXYzine (VISTARIL) 25 MG capsule Take 1 capsule (25 mg total) by mouth daily as needed for anxiety. 07/10/22   Jomarie LongsEappen, Saramma, MD  montelukast (SINGULAIR) 10 MG tablet TAKE 1 TABLET DAILY FOR COUGH, NASAL DRAINAGE AND ALLERGIES 01/25/22   Malva LimesFisher, Donald E, MD  scopolamine (TRANSDERM-SCOP) 1 MG/3DAYS Place 1 patch (1.5 mg total) onto the skin every 3 (three) days. 11/06/21   Malva LimesFisher, Donald E, MD  warfarin (COUMADIN) 5 MG tablet TAKE 1 OR 2 TABLETS (5 OR 10MG ) DAILY AS DIRECTED BY PHYSICIAN Patient taking differently: 5 mg. TAKE 1 OR 2 TABLETS (5 OR 10MG ) DAILY AS DIRECTED BY PHYSICIAN 2 tablets MONDAY AND FRIDAY and 1 tab other days 06/23/22   Malva LimesFisher, Donald E, MD    Physical Exam: Vitals:   08/07/22 1409 08/07/22 1430 08/07/22 1500 08/07/22 1600  BP:  133/82 120/72 (!) 140/89  [name]:  83 71 80  Resp:  (!) 21 16 18   Temp:      TempSrc:      SpO2:  99% 99% 97%  Weight: 100.5 kg     Height: 5' 10.5" (1.791 m)      Physical Exam Vitals and nursing note reviewed.   Constitutional:      General: He is not in acute distress.    Appearance: He is normal weight. He is not toxic-appearing.  HENT:     Head: Normocephalic and atraumatic.     Mouth/Throat:     Mouth: Mucous membranes are moist.     Pharynx: Oropharynx is clear.  Eyes:     General: No scleral icterus.    Extraocular Movements: Extraocular movements intact.     Conjunctiva/sclera: Conjunctivae normal.     Pupils: Pupils are equal, round, and reactive to light.  Cardiovascular:     Rate and Rhythm: Normal rate and regular rhythm.     Heart sounds: No murmur heard.  No gallop.     Comments: Trivial bilateral pitting edema Pulmonary:     Effort: Pulmonary effort is normal. No respiratory distress.     Breath sounds: Wheezing and rhonchi (Diffusely) present. No rales.  Abdominal:     General: Bowel sounds are normal. There is no distension.     Palpations: Abdomen is soft.     Tenderness: There is no abdominal tenderness. There is no guarding.  Skin:    General: Skin is warm and dry.  Neurological:     Mental Status: He is alert.     Comments:  Patient is alert and oriented to person, place, time and situation No facial asymmetry or dysarthria Able to correctly name common items in the room 5/5 strength in bilateral upper and lower extremities, both distal and peripheral Gait not evaluated  Psychiatric:        Mood and Affect: Mood normal.        Behavior: Behavior normal.    Data Reviewed: CBC with WBC of 4.9, hemoglobin 12.5, MCV 93, platelets 177 CMP with sodium of 124, potassium 3.6, chloride 90, bicarb 23, glucose 91, BUN 10, creatinine 0.87, AST 26, ALT 18 and GFR above 60 INR 2.3 Alcohol level negative PTT elevated at 44  EKG personally reviewed.  Sinus rhythm with borderline PR prolongation.  Multiple PVCs.  CT ANGIO HEAD NECK W WO CM (CODE STROKE)  Result Date: 08/07/2022 CLINICAL DATA:  Neuro deficit, acute, stroke suspected. EXAM: CT ANGIOGRAPHY HEAD AND NECK  CT PERFUSION BRAIN TECHNIQUE: Multidetector CT imaging of the head and neck was performed using the standard protocol during bolus administration of intravenous contrast. Multiplanar CT image reconstructions and MIPs were obtained to evaluate the vascular anatomy. Carotid stenosis measurements (when applicable) are obtained utilizing NASCET criteria, using the distal internal carotid diameter as the denominator. Multiphase CT imaging of the brain was performed following IV bolus contrast injection. Subsequent parametric perfusion maps were calculated using RAPID software. RADIATION DOSE REDUCTION: This exam was performed according to the departmental dose-optimization program which includes automated exposure control, adjustment of the mA and/or kV according to patient size and/or use of iterative reconstruction technique. CONTRAST:  OMNIPAQUE IOHEXOL 350 MG/ML SOLN COMPARISON:  Noncontrast head CT performed earlier today 08/07/2022. FINDINGS: CTA NECK FINDINGS Aortic arch: Standard aortic branching. Atherosclerotic plaque within the visualized aortic arch and proximal major branch vessels of the neck. No hemodynamically significant innominate or proximal subclavian artery stenosis. Right carotid system: CCA and ICA patent within the neck without measurable stenosis. Atherosclerotic plaque, greatest about the carotid bifurcation and within the proximal ICA. Left carotid system: CCA and ICA patent within the neck without measurable stenosis. Atherosclerotic plaque, greatest about the carotid bifurcation and within the proximal ICA. Vertebral arteries: Codominant patent within the neck. Nonstenotic calcified plaque at the origins of both vessels. Skeleton: Dextrocurvature of the upper thoracic spine at the imaged levels. C4-C5 grade 1 anterolisthesis. Cervical spondylosis. No acute fracture or aggressive osseous lesion. Other neck: No neck mass or cervical lymphadenopathy. Upper chest: No consolidation within  the imaged lung apices. Right apical pleuroparenchymal scarring. Review of the MIP images confirms the above findings CTA HEAD FINDINGS Anterior circulation: The intracranial internal carotid arteries are patent. Atherosclerotic plaque within both vessels with no more than mild stenosis. The M1 middle cerebral arteries are patent. Atherosclerotic irregularity of the M2 and more distal MCA vessels bilaterally. No M2 proximal branch occlusion or high-grade proximal stenosis. The anterior cerebral arteries are patent. No intracranial  aneurysm is identified. Posterior circulation: The intracranial vertebral arteries are patent. The basilar artery is patent. The posterior cerebral arteries are patent. Atherosclerotic irregularity of both vessels. Most notably, there is a severe stenosis within a right PCA P3 segment branch (series 11, image 20). Hypoplastic right P1 segment with sizable right posterior communicating artery. The left posterior communicating artery is diminutive or absent. Venous sinuses: Within the limitations of contrast timing, no convincing thrombus. Anatomic variants: As described. Review of the MIP images confirms the above findings CT Brain Perfusion Findings: CBF (<30%) Volume: 0mL Perfusion (Tmax>6.0s) volume: NeuromL Mismatch Volume: 0mL Infarction Location:None identified No emergent large vessel occlusion identified. This result, CTA head impression #2 and the CT perfusion head results were communicated to Dr. Iver Nestle at 2:30 pmon 4/11/2024by text page via the [facility] messaging system. IMPRESSION: CTA neck: 1. The common carotid and internal carotid arteries are patent within the neck without stenosis. Atherosclerotic plaque bilaterally, greatest about the carotid bifurcations and within the proximal ICAs. 2. Vertebral arteries codominant and patent within the neck. Nonstenotic atherosclerotic plaque at the origins of both vessels. 3.  Aortic Atherosclerosis (ICD10-I70.0). CTA head: 1. No  intracranial large vessel occlusion identified. 2. Intracranial atherosclerotic disease, as described. Most notably, there is a severe stenosis within a right PCA P3 segment branch. CTA perfusion head: The perfusion software identifies no core infarct. The perfusion software identifies no critically hypoperfused parenchyma (utilizing the Tmax>6 seconds threshold). No mismatch volume reported Electronically Signed   By: Jackey Loge D.O.   On: 08/07/2022 14:32   CT CEREBRAL PERFUSION W CONTRAST  Result Date: 08/07/2022 CLINICAL DATA:  Neuro deficit, acute, stroke suspected. EXAM: CT ANGIOGRAPHY HEAD AND NECK CT PERFUSION BRAIN TECHNIQUE: Multidetector CT imaging of the head and neck was performed using the standard protocol during bolus administration of intravenous contrast. Multiplanar CT image reconstructions and MIPs were obtained to evaluate the vascular anatomy. Carotid stenosis measurements (when applicable) are obtained utilizing NASCET criteria, using the distal internal carotid diameter as the denominator. Multiphase CT imaging of the brain was performed following IV bolus contrast injection. Subsequent parametric perfusion maps were calculated using RAPID software. RADIATION DOSE REDUCTION: This exam was performed according to the departmental dose-optimization program which includes automated exposure control, adjustment of the mA and/or kV according to patient size and/or use of iterative reconstruction technique. CONTRAST:  OMNIPAQUE IOHEXOL 350 MG/ML SOLN COMPARISON:  Noncontrast head CT performed earlier today 08/07/2022. FINDINGS: CTA NECK FINDINGS Aortic arch: Standard aortic branching. Atherosclerotic plaque within the visualized aortic arch and proximal major branch vessels of the neck. No hemodynamically significant innominate or proximal subclavian artery stenosis. Right carotid system: CCA and ICA patent within the neck without measurable stenosis. Atherosclerotic plaque, greatest  about the carotid bifurcation and within the proximal ICA. Left carotid system: CCA and ICA patent within the neck without measurable stenosis. Atherosclerotic plaque, greatest about the carotid bifurcation and within the proximal ICA. Vertebral arteries: Codominant patent within the neck. Nonstenotic calcified plaque at the origins of both vessels. Skeleton: Dextrocurvature of the upper thoracic spine at the imaged levels. C4-C5 grade 1 anterolisthesis. Cervical spondylosis. No acute fracture or aggressive osseous lesion. Other neck: No neck mass or cervical lymphadenopathy. Upper chest: No consolidation within the imaged lung apices. Right apical pleuroparenchymal scarring. Review of the MIP images confirms the above findings CTA HEAD FINDINGS Anterior circulation: The intracranial internal carotid arteries are patent. Atherosclerotic plaque within both vessels with no more than mild stenosis. The M1 middle cerebral  arteries are patent. Atherosclerotic irregularity of the M2 and more distal MCA vessels bilaterally. No M2 proximal branch occlusion or high-grade proximal stenosis. The anterior cerebral arteries are patent. No intracranial aneurysm is identified. Posterior circulation: The intracranial vertebral arteries are patent. The basilar artery is patent. The posterior cerebral arteries are patent. Atherosclerotic irregularity of both vessels. Most notably, there is a severe stenosis within a right PCA P3 segment branch (series 11, image 20). Hypoplastic right P1 segment with sizable right posterior communicating artery. The left posterior communicating artery is diminutive or absent. Venous sinuses: Within the limitations of contrast timing, no convincing thrombus. Anatomic variants: As described. Review of the MIP images confirms the above findings CT Brain Perfusion Findings: CBF (<30%) Volume: 0mL Perfusion (Tmax>6.0s) volume: NeuromL Mismatch Volume: 0mL Infarction Location:None identified No emergent  large vessel occlusion identified. This result, CTA head impression #2 and the CT perfusion head results were communicated to Dr. Iver Nestle at 2:30 pmon 4/11/2024by text page via the [facility] messaging system. IMPRESSION: CTA neck: 1. The common carotid and internal carotid arteries are patent within the neck without stenosis. Atherosclerotic plaque bilaterally, greatest about the carotid bifurcations and within the proximal ICAs. 2. Vertebral arteries codominant and patent within the neck. Nonstenotic atherosclerotic plaque at the origins of both vessels. 3.  Aortic Atherosclerosis (ICD10-I70.0). CTA head: 1. No intracranial large vessel occlusion identified. 2. Intracranial atherosclerotic disease, as described. Most notably, there is a severe stenosis within a right PCA P3 segment branch. CTA perfusion head: The perfusion software identifies no core infarct. The perfusion software identifies no critically hypoperfused parenchyma (utilizing the Tmax>6 seconds threshold). No mismatch volume reported Electronically Signed   By: Jackey Loge D.O.   On: 08/07/2022 14:32   CT HEAD CODE STROKE WO CONTRAST  Result Date: 08/07/2022 CLINICAL DATA:  Code stroke. Aphasia. Abnormal speech noted at 12:49 p.m. EXAM: CT HEAD WITHOUT CONTRAST TECHNIQUE: Contiguous axial images were obtained from the base of the skull through the vertex without intravenous contrast. RADIATION DOSE REDUCTION: This exam was performed according to the departmental dose-optimization program which includes automated exposure control, adjustment of the mA and/or kV according to patient size and/or use of iterative reconstruction technique. COMPARISON:  None Available. FINDINGS: Brain: A focal area of hypoattenuation in the anterior left thalamus measures up to 7 mm. The brain nuclei are otherwise within normal limits. Mild generalized white matter hypoattenuation is present. No acute cortical infarcts are present. No acute hemorrhage or mass lesion is  present. The ventricles are of normal size. No significant extraaxial fluid collection is present. The brainstem and cerebellum are within normal limits. Midline structures are within normal limits. Vascular: Atherosclerotic calcifications are present within the cavernous internal carotid arteries bilaterally. No hyperdense vessel is present. Skull: Calvarium is intact. No focal lytic or blastic lesions are present. No significant extracranial soft tissue lesion is present. Sinuses/Orbits: A small right mastoid effusion is present. Debris in the right external auditory canal likely represents cerumen. The paranasal sinuses and mastoid air cells are otherwise clear. The globes and orbits are within normal limits. ASPECTS (Alberta Stroke Program Early CT Score) - Ganglionic level infarction (caudate, lentiform nuclei, internal capsule, insula, M1-M3 cortex): 7/7 - Supraganglionic infarction (M4-M6 cortex): 3/3 Total score (0-10 with 10 being normal): 10/10 IMPRESSION: 1. 7 mm focal area of hypoattenuation in the anterior left thalamus likely represents an age-indeterminate infarct. 2. No acute cortical infarcts. 3. Mild generalized white matter disease likely reflects the sequela of chronic microvascular ischemia. 4.  Small right mastoid effusion. The above was relayed via text pager to Dr. Rollene Fare on 08/07/2022 at 13:45 . Electronically Signed   By: Marin Roberts M.D.   On: 08/07/2022 13:46    Results are pending, will review when available.  Assessment and Plan:  * TIA (transient ischemic attack) Chase [name] is presenting with sudden onset aphasia and slurred speech that lasted approximately 2 hours before complete resolution.  Prior history of CVA secondary to DVT and PFO currently on warfarin.  CTA negative for LVO.  Per neurology, no need to repeat echocardiogram at this time.  - Neurology consulted; appreciate their recommendations - MRI brain pending - Restart home warfarin pending MRI  results - Telemetry monitoring - Allow for permissive HTN  - A1C and Lipid panel  - Start high intensity statin - PT/OT/SLP  Hyponatremia Patient presenting with sodium of 124 and serum osmolality of 257, in the setting of daily alcohol use, concerning for beer potomania.  Differential also includes SIADH in the setting of nearly 120-pack-year history although CT chest in May 2023 demonstrated stable pulmonary nodules.  - S/p 500 cc normal saline bolus - Continue very gentle maintenance fluids at 50 cc/h given high risk with concurrent alcohol use disorder - BMP every 6 hours - Maximum goal sodium in the next 24 hours: 130 - Urine sodium and urine osmolality pending  Alcohol use disorder, moderate, dependence Patient states that he drinks 4-6 beers daily and has done so for years.  He is not interested in cessation even when we discussed that his sodium levels may be secondary to his daily beer use.  He denies any prior history of withdrawal, but has only stopped drinking long enough to find out once before.  - CIWA monitoring with as needed Ativan - Daily thiamine and folic acid  Shuffling gait Patient's wife states that over the last few weeks, she has noticed that Chase [name] has begun to experience a shuffling gait.  Given history of alcohol use disorder, differential includes alcoholic associated neurological location versus B12 deficiency.  Per chart review, patient has a history of tardive Dyskinesia, however I cannot see any prior antipsychotic use, so I wonder if this may be secondary to Parkinsonism  - Neurology following; appreciate their recommendations - Vitamin B12 and B1 pending - High-dose thiamine per neurology recommendations: 500 mg every 8 hours for 3 days  Emphysema lung Patient states he has been told in the last year that he has COPD. He denies any symptoms at this time, despite his 120-pack-year smoking history.  Significant wheezing and rhonchi heard on  examination.  Given lack of symptoms, will start with as needed albuterol only.  - Albuterol as needed for wheezing or shortness of breath - Patient not interested in tobacco use cessation  Primary hypertension - Hold home antihypertensives in the setting of permissive hypertension  History of DVT in adulthood - Holding home warfarin pending MRI results.  If no evidence of CVA, will restart with pharmacy dosing  Advance Care Planning:   Code Status: Full Code Chase [name] states that he would never want to be on long-term life support, however he is amenable to a very short trial of cardiopulmonary resuscitation in the event of arrest  Consults: Neurology  Family Communication: Patient's wife updated at bedside  Severity of Illness: The appropriate patient status for this patient is OBSERVATION. Observation status is judged to be reasonable and necessary in order to provide the required intensity of service to  ensure the patient's safety. The patient's presenting symptoms, physical exam findings, and initial radiographic and laboratory data in the context of their medical condition is felt to place them at decreased risk for further clinical deterioration. Furthermore, it is anticipated that the patient will be medically stable for discharge from the hospital within 2 midnights of admission.   Author: Verdene Lennert, MD 08/07/2022 4:22 PM  For on call review [email or web address].

## 2022-08-07 NOTE — Assessment & Plan Note (Signed)
Patient's wife states that over the last few weeks, she has noticed that Chase [name] has begun to experience a shuffling gait.  Given history of alcohol use disorder, differential includes alcoholic associated neurological location versus B12 deficiency.  Per chart review, patient has a history of tardive Dyskinesia, however I cannot see any prior antipsychotic use, so I wonder if this may be secondary to Parkinsonism  - Neurology following; appreciate their recommendations - Vitamin B12 and B1 pending - High-dose thiamine per neurology recommendations: 500 mg every 8 hours for 3 days

## 2022-08-07 NOTE — ED Provider Notes (Addendum)
[facility] Provider Note    Event Date/Time   First MD Initiated Contact with Patient 08/07/22 1353     (approximate)   History   Aphasia and Code Stroke   HPI  Chase [name] is a 72 y.o. male past medical history of DVT, prior stroke from PFO on Coumadin who presents with aphasia.  Patient tells me that he was in his normal state of health when he suddenly started to feel abnormally.  His wife saw him normal at 36.  When she came home at 1249 he was unable to talk.  Patient denies focal numbness weakness per denies pain.  Wife tells me that when she was driving him over he started to improve but was still talking nonsense not able to answer questions.  She never noted any facial droop, she was able to ambulate.     Past Medical History:  Diagnosis Date   Cerebrovascular accident (CVA) 10/09/2011   Clotting disorder    DVT (deep venous thrombosis)    Hearing deficit    History of ischemic vertebrobasilar artery thalamic stroke 2001   History of stroke    Hyperlipidemia    Hypertension    Patent foramen ovale    Right knee DJD    Stroke 2001    Patient Active Problem List   Diagnosis Date Noted   Specific phobia 11/12/2021   Alcohol use disorder, moderate, dependence 11/13/2020   Tardive dyskinesia 10/16/2020   Emphysema lung 07/24/2020   MDD (major depressive disorder), single episode, mild 06/17/2019   Alcohol use disorder, mild, abuse 02/23/2019   Mild cognitive disorder 02/23/2019   Depressive disorder due to another medical condition with depressive features 11/24/2018   Panic attacks 11/24/2018   Allergic rhinitis 07/27/2017   Other specified anxiety disorders 02/09/2017   Anticoagulant long-term use 08/18/2016   Tobacco use disorder 08/18/2016   Ache in joint 04/05/2015   Memory impairment 04/05/2015   S/P right total knee replacement 06/29/2012   Thoracic aortic aneurysm (HCC) 01/20/2012   History of ischemic vertebrobasilar  artery thalamic stroke    Patent foramen ovale    History of DVT in adulthood    Primary hypertension    Hearing deficit    Right knee DJD    History of CVA (cerebrovascular accident) 10/09/2011   Eustachian tube dysfunction 11/15/2008   Cerebrovascular accident, late effects 08/29/2008   Ostium secundum type atrial septal defect 05/06/2000   Sleep apnea, obstructive 01/23/2000   Major depressive disorder, single episode 03/21/1998   Compulsive tobacco user syndrome 03/21/1998     Physical Exam  Triage Vital Signs: ED Triage Vitals  Enc Vitals Group     BP 08/07/22 1342 132/88     Pulse Rate 08/07/22 1342 87     Resp 08/07/22 1342 20     Temp 08/07/22 1342 98.3 F (36.8 C)     Temp Source 08/07/22 1342 Oral     SpO2 08/07/22 1342 94 %     Weight 08/07/22 1342 218 lb 0.6 oz (98.9 kg)     Height 08/07/22 1409 5' 10.5" (1.791 m)     Head Circumference --      Peak Flow --      Pain Score --      Pain Loc --      Pain Edu? --      Excl. in GC? --     Most recent vital signs: Vitals:   08/07/22 1342 08/07/22 1430  BP: 132/88 133/82  Pulse: 87 83  Resp: 20 (!) 21  Temp: 98.3 F (36.8 C)   SpO2: 94% 99%     General: Awake, no distress.  CV:  Good peripheral perfusion.  Resp:  Normal effort.  Abd:  No distention.  Neuro:             Awake, Alert, Oriented x 3  Other:  Aox3, nml speech  PERRL, EOMI, face symmetric, nml tongue movement  5/5 strength in the BL upper and lower extremities  Sensation grossly intact in the BL upper and lower extremities  Finger-nose-finger intact BL    ED Results / Procedures / Treatments  Labs (all labs ordered are listed, but only abnormal results are displayed) Labs Reviewed  PROTIME-INR - Abnormal; Notable for the following components:      Result Value   Prothrombin Time 25.0 (*)    INR 2.3 (*)    All other components within normal limits  APTT - Abnormal; Notable for the following components:   aPTT 44 (*)    All other  components within normal limits  CBC - Abnormal; Notable for the following components:   RBC 3.79 (*)    Hemoglobin 12.5 (*)    HCT 35.4 (*)    All other components within normal limits  COMPREHENSIVE METABOLIC PANEL - Abnormal; Notable for the following components:   Sodium 124 (*)    Chloride 90 (*)    Calcium 8.7 (*)    All other components within normal limits  DIFFERENTIAL  ETHANOL  VITAMIN B1  VITAMIN B12  OSMOLALITY, URINE  SODIUM, URINE, RANDOM  OSMOLALITY  MAGNESIUM  CBG MONITORING, ED  I-STAT CREATININE, ED     EKG  EKG reviewed interpreted myself shows sinus rhythm with normal axis and intervals PVCs no acute ischemic changes   RADIOLOGY I reviewed and interpreted the CT scan of the brain which does not show any acute intracranial process    PROCEDURES:  Critical Care performed: No  .1-3 Lead EKG Interpretation  Performed by: Georga HackingMcHugh, Caralyn Twining Rose, MD Authorized by: Georga HackingMcHugh, Unique Sillas Rose, MD     Interpretation: normal     ECG rate assessment: normal     Rhythm: sinus rhythm     Ectopy: none     Conduction: normal     The patient is on the cardiac monitor to evaluate for evidence of arrhythmia and/or significant heart rate changes.   MEDICATIONS ORDERED IN ED: Medications  sodium chloride flush (NS) 0.9 % injection 3 mL (3 mLs Intravenous Not Given 08/07/22 1429)  thiamine (VITAMIN B1) injection 100 mg (has no administration in time range)  sodium chloride 0.9 % bolus 500 mL (has no administration in time range)  iohexol (OMNIPAQUE) 350 MG/ML injection 100 mL (100 mLs Intravenous Contrast Given 08/07/22 1343)     IMPRESSION / MDM / ASSESSMENT AND PLAN / ED COURSE  I reviewed the triage vital signs and the nursing notes.                              Patient's presentation is most consistent with acute presentation with potential threat to life or bodily function.  Differential diagnosis includes, but is not limited to, ischemic stroke, hemorrhagic  stroke, seizure, electrolyte abnormality   The patient is a 72 year old male with history of Coumadin prior stroke with PFO and DVT who presents with aphasia.  This started somewhere around 312.  Last  known normal was around 11 per patient's wife.  When she came home he was not able to talk, is talking nonsense.  Patient just endorses feeling abnormal.  On arrival to ED drug alert was called from triage.  Patient was evaluated by neurology.  INR 2.3 so not a candidate for tPA.  Was having some difficulty following commands and aphasic.  CT head is negative for stroke.  CT angio without LVO.  CT perfusion without area of mismatch.  On my exam after CT patient's NIH stroke scale is essentially 0.  He is not having aphasia currently.  Of note his sodium is 124.  I asked the patient about p.o. intake he has not had any GI losses eating and drinking normally.  He does however drink 4-6 beers per day.  Last drink was yesterday.  Suspect a component of beer potomania patient does not particular look dry.  Will give thiamine small bolus of fluid and check urine awesome's and osmolality.  Per neurology patient will need MRI to determine whether he continues warfarin.  No need for echo at this time.  Patient's hyponatremia and need for MRI will admit.      FINAL CLINICAL IMPRESSION(S) / ED DIAGNOSES   Final diagnoses:  Aphasia  Hyponatremia     Rx / DC Orders   ED Discharge Orders     None        Note:  This document was prepared using Dragon voice recognition software and may include unintentional dictation errors.   Georga Hacking, MD 08/07/22 1448    Georga Hacking, MD 08/07/22 [phone]

## 2022-08-07 NOTE — ED Notes (Signed)
Pt closes eyes with command. Pt said "yeah" when this nurse asked him to close eyes. BP is 128/78. CBG is 88.

## 2022-08-07 NOTE — Assessment & Plan Note (Signed)
-   Holding home warfarin pending MRI results.  If no evidence of CVA, will restart with pharmacy dosing

## 2022-08-07 NOTE — Assessment & Plan Note (Addendum)
Chase [name] is presenting with sudden onset aphasia and slurred speech that lasted approximately 2 hours before complete resolution.  Prior history of CVA secondary to DVT and PFO currently on warfarin.  CTA negative for LVO.  Per neurology, no need to repeat echocardiogram at this time.  - Neurology consulted; appreciate their recommendations - MRI brain pending - Restart home warfarin pending MRI results - Telemetry monitoring - Allow for permissive HTN  - A1C and Lipid panel  - Start high intensity statin - PT/OT/SLP

## 2022-08-07 NOTE — Consult Note (Addendum)
Neurology Consultation Reason for Consult: Code stroke Requesting Physician: Augusto Gamble  CC: Aphasia  History is obtained from: Wife, patient, chart review  HPI: Chase [name] is a 72 y.o. male with past medical history significant for left thalamic stroke (09/2011 with residual short-term memory loss), hypertension, hyperlipidemia, PFO, DVT on chronic anticoagulation with warfarin, right knee degenerative joint disease s/p arthroplasty, hearing loss, anxiety on chronic Xanax, daily alcohol use 4-6 beers per day  The patient's wife last saw him in his perfectly normal state of health at 11 AM when she left go shopping.  She called him at 12:04 PM to ask for measurements of the porch swing as she needed this for the shopping she was doing.  He was slightly confused at that time, initially measuring the data consider the porch swing and reporting the measurements in a slightly strange way but she attributed this to potentially him not hearing her well.  However when she returned to the house at 12:46 PM she found that he was hot and sweaty and very confused, using incorrect words for example telling her he was looking at the "waterfall" when he was looking at the window at a truck.  She is not sure he has been eating well in the last few days because he has been someone else helping a friend and he does sometimes have issues remembering to eat due to his short-term memory  LKW: 11 AM Thrombolytic given?: No, symptoms resolved and INR 2.1 IA performed?: No, no LVO Premorbid modified rankin scale: 1-2      1 - No significant disability. Able to carry out all usual activities, despite some symptoms.     2 - Slight disability. Able to look after own affairs without assistance, but unable to carry out all previous activities.   ROS: All other review of systems was negative except as noted in the HPI, with caveat of short-term memory loss and aphasia.  Past Medical History:  Diagnosis Date    Cerebrovascular accident (CVA) 10/09/2011   Clotting disorder    DVT (deep venous thrombosis)    Hearing deficit    History of ischemic vertebrobasilar artery thalamic stroke 2001   History of stroke    Hyperlipidemia    Hypertension    Patent foramen ovale    Right knee DJD    Stroke 2001   Past Surgical History:  Procedure Laterality Date   APPENDECTOMY     FRACTURE SURGERY     R leg   INCISION AND DRAINAGE Right 06/30/2012   Procedure: INCISION AND DRAINAGE;  Surgeon: Nilda Simmer, MD;  Location: [facility] OR;  Service: Orthopedics;  Laterality: Right;   KNEE ARTHROSCOPY     right knee   KNEE BURSECTOMY Right 06/30/2012   Procedure: KNEE BURSECTOMY;  Surgeon: Nilda Simmer, MD;  Location: [facility];  Service: Orthopedics;  Laterality: Right;  Pre-patellar with wound closure   TOTAL KNEE ARTHROPLASTY  01/26/2012   Procedure: TOTAL KNEE ARTHROPLASTY;  Surgeon: Nilda Simmer, MD;  Location: [facility] OR;  Service: Orthopedics;  Laterality: Right;     Family History  Problem Relation Age of Onset   Cancer Father    Seizures Brother      Social History:  reports that he has been smoking cigarettes. He has a 98.00 pack-year smoking history. He has never used smokeless tobacco. He reports current alcohol use of about 28.0 - 42.0 standard drinks of alcohol per week. He reports that he does not use  drugs.   Exam: Current vital signs: BP 133/82   Pulse 83   Temp 98.3 F (36.8 C) (Oral)   Resp (!) 21   Ht 5' 10.5" (1.791 m)   Wt 100.5 kg   SpO2 99%   BMI 31.35 kg/m  Vital signs in last 24 hours: Temp:  [98.3 F (36.8 C)] 98.3 F (36.8 C) (04/11 1342) Pulse Rate:  [83-87] 83 (04/11 1430) Resp:  [20-21] 21 (04/11 1430) BP: (132-133)/(82-88) 133/82 (04/11 1430) SpO2:  [94 %-99 %] 99 % (04/11 1430) Weight:  [98.9 kg-100.5 kg] 100.5 kg (04/11 1409)   Physical Exam  Constitutional: Appears well-developed and well-nourished.  Psych: Affect pleasant and cooperative even when  confused Eyes: No scleral injection HENT: No oropharyngeal obstruction.  MSK: no major joint deformities.  Cardiovascular: Perfusing extremities well Respiratory: Effort normal, non-labored breathing GI: Soft.  No distension. There is no tenderness.  Skin: Warm dry and intact visible skin  Neuro: Mental Status: Patient is awake, alert, but could not answer orientation questions on initial evaluation nor give any significant history with productive greater than receptive aphasia (some intermittent difficulty following commands), but no neglect Cranial Nerves: II: Visual Fields are full. Pupils are equal, round, and reactive to light.   III,IV, VI: EOMI without ptosis or diploplia.  V: Facial sensation is symmetric to light touch VII: Facial movement is symmetric.  VIII: hearing is intact to voice X: Uvula elevates symmetrically XI: Shoulder shrug is symmetric. XII: tongue is midline without atrophy or fasciculations.  Motor: Tone is normal. Bulk is normal.  No pronator drift.  No drift of the bilateral lower extremities. Sensory: Sensation is symmetric to light touch in the arms and legs Cerebellar: FNF and HKS are intact bilaterally Gait:  Deferred  NIHSS total 5 Score breakdown: 2 points for inability to answer month and age, 1 point for inability to follow some commands, 2 points for severe aphasia initially Performed at 1:35 PM   I have reviewed labs in epic and the results pertinent to this consultation are:  Basic Metabolic Panel: Recent Labs  Lab 08/07/22 1326  NA 124*  K 3.6  CL 90*  CO2 23  GLUCOSE 91  BUN 10  CREATININE 0.87  CALCIUM 8.7*    CBC: Recent Labs  Lab 08/07/22 1326  WBC 4.9  NEUTROABS 3.1  HGB 12.5*  HCT 35.4*  MCV 93.4  PLT 177    Coagulation Studies: Recent Labs    08/07/22 1326  LABPROT 25.0*  INR 2.3*      I have reviewed the images obtained:  Head CT personally reviewed, age-indeterminate left thalamic hypodensity  chronic based on history provided by family, no acute intracranial process  CTA head and neck and CT perfusion personally reviewed, agree with radiology CTA neck:   1. The common carotid and internal carotid arteries are patent within the neck without stenosis. Atherosclerotic plaque bilaterally, greatest about the carotid bifurcations and within the proximal ICAs. 2. Vertebral arteries codominant and patent within the neck. Nonstenotic atherosclerotic plaque at the origins of both vessels. 3.  Aortic Atherosclerosis (ICD10-I70.0).   CTA head:   1. No intracranial large vessel occlusion identified. 2. Intracranial atherosclerotic disease, as described. Most notably, there is a severe stenosis within a right PCA P3 segment branch.   CTA perfusion head:   The perfusion software identifies no core infarct. The perfusion software identifies no critically hypoperfused parenchyma (utilizing the Tmax>6 seconds threshold). No mismatch volume reported  Impression: Stroke/TIA versus  symptomatic hyponatremia versus provoked focal seizure.  Fortunately patient symptoms are rapidly improving and he is essentially at baseline at this time  Code stroke recommendations: -Ethanol level, UA, UDS -CTA head and neck to rule out LVO -CT perfusion specifically obtained to more sensitively assess for a branch MCA occlusion at the origin given the severity of the patient's symptoms, which can be missed on CTA  Additional recommendations:  # Stroke versus TIA versus focal seizure -MRI brain without contrast to confirm safety of continuing warfarin, if MRI positive for stroke will need to determine start date for anticoagulation based on size of stroke -Permissive hypertension to 180/100 given patient is therapeutically anticoagulated; if MRI brain is negative, goal normotension -Echocardiogram would not change management from a neurological perspective given patient has a long-term indication for  anticoagulation  -Lipid panel, goal LDL less than 70 -A1c, goal less than 7% -Routine EEG given focality of examination (may be canceled if MRI returns positive for stroke) -Risk factor modification counseling, patient counseled to substantially but gradually cut back on his drinking in accordance with AHA guidelines  #hyponatremia -Gradual correction of sodium by no more than 6 to 8 mEq per 24 hours to avoid central pontine myelinolysis -Appreciate additional workup and management per primary team  #Daily drinking -Thiamine and B12 levels (ordered) -After thiamine level is drawn please order 500 mg IV thiamine every 8 hours as this can be a treatable cause for memory impairment; may be discontinued after 3 days or if thiamine results normal -Appreciate CIWA protocol and management per primary team  Appreciate primary team management of other comorbidities, recommendations conveyed to primary team via secure chat and discussed with Dr. Sidney Ace of ED at bedside  Brooke Dare MD-PhD [facility] [phone] [facility] coverage for [facility] is from 8 AM to 4 AM in-house and 4 PM to 8 PM by telephone/video. 8 PM to 8 AM emergent questions or overnight urgent questions should be addressed to Teleneurology On-call or [facility] neurohospitalist; contact information can be found on AMION  CRITICAL CARE Performed by: Gordy Councilman   Total critical care time: 50 minutes  Critical care time was exclusive of separately billable procedures and treating other patients.  Critical care was necessary to treat or prevent imminent or life-threatening deterioration, emergent evaluation for potential treatment with thrombolytic or thrombectomy.  Critical care was time spent personally by me on the following activities: development of treatment plan with patient and/or surrogate as well as nursing, discussions with consultants, evaluation of patient's response to treatment,  examination of patient, obtaining history from patient or surrogate, ordering and performing treatments and interventions, ordering and review of laboratory studies, ordering and review of radiographic studies, pulse oximetry and re-evaluation of patient's condition.

## 2022-08-07 NOTE — Assessment & Plan Note (Signed)
Patient states that he drinks 4-6 beers daily and has done so for years.  He is not interested in cessation even when we discussed that his sodium levels may be secondary to his daily beer use.  He denies any prior history of withdrawal, but has only stopped drinking long enough to find out once before.  - CIWA monitoring with as needed Ativan - Daily thiamine and folic acid

## 2022-08-08 ENCOUNTER — Encounter: Payer: Self-pay | Admitting: Internal Medicine

## 2022-08-08 ENCOUNTER — Other Ambulatory Visit: Payer: Self-pay

## 2022-08-08 ENCOUNTER — Telehealth: Payer: Self-pay

## 2022-08-08 ENCOUNTER — Observation Stay: Payer: Medicare Other

## 2022-08-08 DIAGNOSIS — R569 Unspecified convulsions: Secondary | ICD-10-CM | POA: Diagnosis not present

## 2022-08-08 DIAGNOSIS — G459 Transient cerebral ischemic attack, unspecified: Secondary | ICD-10-CM | POA: Diagnosis not present

## 2022-08-08 LAB — CBC WITH DIFFERENTIAL/PLATELET
Abs Immature Granulocytes: 0.01 10*3/uL (ref 0.00–0.07)
Basophils Absolute: 0 10*3/uL (ref 0.0–0.1)
Basophils Relative: 0 %
Eosinophils Absolute: 0.1 10*3/uL (ref 0.0–0.5)
Eosinophils Relative: 1 %
HCT: 34.7 % — ABNORMAL LOW (ref 39.0–52.0)
Hemoglobin: 12.4 g/dL — ABNORMAL LOW (ref 13.0–17.0)
Immature Granulocytes: 0 %
Lymphocytes Relative: 27 %
Lymphs Abs: 1.1 10*3/uL (ref 0.7–4.0)
MCH: 33.2 pg (ref 26.0–34.0)
MCHC: 35.7 g/dL (ref 30.0–36.0)
MCV: 93 fL (ref 80.0–100.0)
Monocytes Absolute: 0.7 10*3/uL (ref 0.1–1.0)
Monocytes Relative: 16 %
Neutro Abs: 2.3 10*3/uL (ref 1.7–7.7)
Neutrophils Relative %: 56 %
Platelets: 174 10*3/uL (ref 150–400)
RBC: 3.73 MIL/uL — ABNORMAL LOW (ref 4.22–5.81)
RDW: 13.5 % (ref 11.5–15.5)
WBC: 4.2 10*3/uL (ref 4.0–10.5)
nRBC: 0 % (ref 0.0–0.2)

## 2022-08-08 LAB — BASIC METABOLIC PANEL
Anion gap: 6 (ref 5–15)
Anion gap: 8 (ref 5–15)
Anion gap: 9 (ref 5–15)
BUN: 14 mg/dL (ref 8–23)
BUN: 14 mg/dL (ref 8–23)
BUN: 13 mg/dL (ref 8–23)
CO2: 23 mmol/L (ref 22–32)
CO2: 23 mmol/L (ref 22–32)
CO2: 25 mmol/L (ref 22–32)
Calcium: 8.2 mg/dL — ABNORMAL LOW (ref 8.9–10.3)
Calcium: 8.5 mg/dL — ABNORMAL LOW (ref 8.9–10.3)
Calcium: 8.7 mg/dL — ABNORMAL LOW (ref 8.9–10.3)
Chloride: 98 mmol/L (ref 98–111)
Chloride: 100 mmol/L (ref 98–111)
Chloride: 99 mmol/L (ref 98–111)
Creatinine, Ser: 0.77 mg/dL (ref 0.61–1.24)
Creatinine, Ser: 0.87 mg/dL (ref 0.61–1.24)
Creatinine, Ser: 0.87 mg/dL (ref 0.61–1.24)
GFR, Estimated: >60 mL/min (ref >60)
GFR, Estimated: >60 mL/min (ref >60)
GFR, Estimated: >60 mL/min (ref >60)
Glucose, Bld: 95 mg/dL (ref 70–99)
Glucose, Bld: 109 mg/dL — ABNORMAL HIGH (ref 70–99)
Glucose, Bld: 140 mg/dL — ABNORMAL HIGH (ref 70–99)
Potassium: 3.7 mmol/L (ref 3.5–5.1)
Potassium: 3.9 mmol/L (ref 3.5–5.1)
Potassium: 4 mmol/L (ref 3.5–5.1)
Sodium: 127 mmol/L — ABNORMAL LOW (ref 135–145)
Sodium: 131 mmol/L — ABNORMAL LOW (ref 135–145)
Sodium: 133 mmol/L — ABNORMAL LOW (ref 135–145)

## 2022-08-08 LAB — PROTIME-INR
INR: 2 — ABNORMAL HIGH (ref 0.8–1.2)
Prothrombin Time: 22.6 seconds — ABNORMAL HIGH (ref 11.4–15.2)

## 2022-08-08 LAB — LIPID PANEL
Cholesterol: 153 mg/dL (ref 0–200)
HDL: 51 mg/dL (ref >40)
LDL Cholesterol: 90 mg/dL (ref 0–99)
Total CHOL/HDL Ratio: 3 RATIO
Triglycerides: 60 mg/dL (ref <150)
VLDL: 12 mg/dL (ref 0–40)

## 2022-08-08 LAB — PHOSPHORUS: Phosphorus: 3.8 mg/dL (ref 2.5–4.6)

## 2022-08-08 LAB — HEMOGLOBIN A1C
Hgb A1c MFr Bld: 5.4 % (ref 4.8–5.6)
Mean Plasma Glucose: 108 mg/dL

## 2022-08-08 LAB — MAGNESIUM: Magnesium: 2 mg/dL (ref 1.7–2.4)

## 2022-08-08 MED ORDER — ATORVASTATIN CALCIUM 10 MG PO TABS: 10.0000 mg | ORAL_TABLET | Freq: Every day | ORAL | 0 refills | Status: DC

## 2022-08-08 MED ORDER — VITAMIN B-12 1000 MCG PO TABS
1000.0000 ug | ORAL_TABLET | Freq: Every day | ORAL | Status: DC
  Administered 2022-08-08: 1000 ug via ORAL
  Filled 2022-08-08: qty 1

## 2022-08-08 MED ORDER — CYANOCOBALAMIN 1000 MCG PO TABS: 1000.0000 ug | ORAL_TABLET | Freq: Every day | ORAL | Status: AC

## 2022-08-08 MED ORDER — ATORVASTATIN CALCIUM 20 MG PO TABS: 10.0000 mg | ORAL_TABLET | Freq: Every day | ORAL | Status: DC

## 2022-08-08 MED ORDER — WARFARIN SODIUM 10 MG PO TABS
10.0000 mg | ORAL_TABLET | Freq: Once | ORAL | Status: AC
  Administered 2022-08-08: 10 mg via ORAL
  Filled 2022-08-08: qty 1

## 2022-08-08 MED ORDER — WARFARIN - PHARMACIST DOSING INPATIENT: Freq: Every day | Status: DC

## 2022-08-08 MED ORDER — POTASSIUM CHLORIDE CRYS ER 20 MEQ PO TBCR
40.0000 meq | EXTENDED_RELEASE_TABLET | Freq: Once | ORAL | Status: AC
  Administered 2022-08-08: 40 meq via ORAL
  Filled 2022-08-08: qty 2

## 2022-08-08 NOTE — Telephone Encounter (Unsigned)
Copied from CRM [id number]. Topic: Appointment Scheduling - Scheduling Inquiry for Clinic >> Aug 08, 2022  4:05 PM Ja-Kwan M wrote: Reason for CRM: [facility] called to advise that pt will be discharging on 08/08/22 and needs hospital fu with pcp within 1 week. There are no hospital fu appts with Dr. Sherrie Mustache within 14 days. Please contact pt to schedule hospital fu appt.

## 2022-08-08 NOTE — Progress Notes (Addendum)
SLP Cancellation Note  Patient Details Name: Chase [name] MRN: [id number] DOB: 1950-08-02   Cancelled treatment:       Reason Eval/Treat Not Completed: SLP screened, no needs identified, will sign off (chart reviewed; consulted NSG then met w/ pt and Wife)  Pt denied any difficulty swallowing and is currently on a regular diet; tolerates swallowing pills w/ water per NSG.  Pt conversed in conversation w/ this SLP and Wife w/out expressive/receptive deficits noted; speech clear/fully intelligible. Pt denied any current speech-language deficits.  No further skilled ST services indicated as pt appears at his baseline. Encouraged pt to not overdo it when he returns home post acute admit; hydrate w/ water, rest. F/u w/ a Neurologist for education as pt has had a previous CVA. Pt/Wife agreed. NSG to reconsult if any change in status while admitted.      Jerilynn Som, MS, CCC-SLP Speech Language Pathologist Rehab Services; [facility] [phone] (ascom) Luwanna Brossman 08/08/2022, 10:07 AM

## 2022-08-08 NOTE — Care Management Obs Status (Signed)
MEDICARE OBSERVATION STATUS NOTIFICATION   Patient Details  Name: Chase [name] MRN: [id number] Date of Birth: 1951-03-16   Medicare Observation Status Notification Given:  Yes    Taccara Bushnell, LCSW 08/08/2022, 3:25 PM

## 2022-08-08 NOTE — Procedures (Signed)
Patient Name: DUNCAN BLOYER  MRN: [id number]  Epilepsy Attending: Charlsie Quest  Referring Physician/Provider: Gordy Councilman, MD  Date: 08/08/2022 Duration: 33.23 mins  Patient history: 71yo m with sudden onset aphasia and slurred speech . EEG to evaluate for seizure  Level of alertness: Awake  AEDs during EEG study: None  Technical aspects: This EEG study was done with scalp electrodes positioned according to the 10-20 International system of electrode placement. Electrical activity was reviewed with band pass filter of 1-70Hz , sensitivity of 7 uV/mm, display speed of 41mm/sec with a 60Hz  notched filter applied as appropriate. EEG data were recorded continuously and digitally stored.  Video monitoring was available and reviewed as appropriate.  Description: The posterior dominant rhythm consists of 8 Hz activity of moderate voltage (25-35 uV) seen predominantly in posterior head regions, symmetric and reactive to eye opening and eye closing. Drowsiness was characterized by attenuation of the posterior background rhythm. Hyperventilation and photic stimulation were not performed.     IMPRESSION: This study is within normal limits. No seizures or epileptiform discharges were seen throughout the recording.  A normal interictal EEG does not exclude the diagnosis of epilepsy.  [place]emell Town Annabelle Harman

## 2022-08-08 NOTE — Progress Notes (Signed)
OT Cancellation Note  Patient Details Name: Chase [name] MRN: [id number] DOB: 1951/01/24   Cancelled Treatment:    Reason Eval/Treat Not Completed: OT screened, no needs identified, will sign off. Pt able to demonstrate donning his pants; wife at bedside stating he's at baseline; already cleared PT; no OT needs at this time.  Alvester Morin 08/08/2022, 10:43 AM

## 2022-08-08 NOTE — Plan of Care (Signed)
  Problem: Education: Goal: Knowledge of disease or condition will improve Outcome: Progressing Goal: Knowledge of patient specific risk factors will improve Loraine Leriche N/A or DELETE if not current risk factor) Outcome: Progressing   Problem: Ischemic Stroke/TIA Tissue Perfusion: Goal: Complications of ischemic stroke/TIA will be minimized Outcome: Progressing   Problem: Coping: Goal: Will verbalize positive feelings about self Outcome: Progressing   Problem: Health Behavior/Discharge Planning: Goal: Ability to manage health-related needs will improve Outcome: Progressing   Problem: Nutrition: Goal: Risk of aspiration will decrease Outcome: Progressing

## 2022-08-08 NOTE — Progress Notes (Signed)
Eeg done 

## 2022-08-08 NOTE — Discharge Summary (Addendum)
Physician Discharge Summary   [name] MRN: [id number] DOB: 1950/06/26  Admit date:     08/07/2022  Discharge date: 08/09/22  Discharge Physician: Lurene Shadow   PCP: [name], MD   Recommendations at discharge:   Follow-up with PCP in 1 to 2 weeks                      Discharge Diagnoses: Principal Problem:   TIA (transient ischemic attack) Active Problems:   Hyponatremia   Alcohol use disorder, moderate, dependence   Shuffling gait   History of DVT in adulthood   Primary hypertension   Emphysema lung   Acute metabolic encephalopathy  Resolved Problems:   * No resolved hospital problems. [facility] Course:  Chase [name] is a 72 y.o. male with medical history significant for CVA due to DVT with PFO on warfarin, alcohol use disorder, tobacco use disorder, hypertension, hyperlipidemia, depression/anxiety, tardive dyskinesia, right knee degenerative joint disease s/p arthroplasty, hearing impairment, anxiety, who presented to the hospital with sudden onset of confusion and aphasia.  Symptoms lasted about 2 hours send by the time he got to the emergency department, His symptoms had resolved.  He was admitted to the hospital for stroke workup.  CT head and MRI brain did not show any evidence of acute stroke so acute stroke was ruled out.  He was hyponatremic on admission with a sodium of 124. He was treated with IV fluids and sodium improved to 133.  Differential diagnosis for acute confusion and aphasia include acute metabolic encephalopathy and TIA.  Vitamin B12 level was low normal so he was started on vitamin B12 supplement.  He had hypomagnesemia that was repleted successfully.  His condition has improved and he is deemed stable for discharge to home.  He has been advised to stop smoking cigarettes and avoid alcohol.  Discharge plan was discussed with the patient and his wife at the bedside.       Consultants: Neurologist Procedures performed:  None Disposition: Home Diet recommendation:  Discharge Diet Orders (From admission, onward)     Start     Ordered   08/08/22 0000  Diet - low sodium heart healthy        08/08/22 1553           Cardiac diet DISCHARGE MEDICATION: Allergies as of 08/08/2022       Reactions   Klonopin [clonazepam] Palpitations   Chantix [varenicline Tartrate]    confusion        Medication List     STOP taking these medications    clonazePAM 0.5 MG tablet Commonly known as: KLONOPIN       TAKE these medications    amLODipine-benazepril 5-10 MG capsule Commonly known as: LOTREL TAKE 1 CAPSULE EVERY DAY (NEED MD APPOINTMENT)   amoxicillin 500 MG tablet Commonly known as: AMOXIL Take by mouth as directed. 1 hour prior to dental procedures   atorvastatin 10 MG tablet Commonly known as: LIPITOR Take 1 tablet (10 mg total) by mouth at bedtime.   busPIRone 7.5 MG tablet Commonly known as: BUSPAR Take 1 tablet (7.5 mg total) by mouth as directed. Take 1 tablet daily AM and half tablet daily at nigh   cyanocobalamin 1000 MCG tablet Take 1 tablet (1,000 mcg total) by mouth daily.   DULoxetine 60 MG capsule Commonly known as: CYMBALTA Take 1 capsule (60 mg total) by mouth daily. Start taking on: Aug 28, 2022  fluticasone 50 MCG/ACT nasal spray Commonly known as: FLONASE Place 1 spray into both nostrils as needed.   hydrochlorothiazide 25 MG tablet Commonly known as: HYDRODIURIL TAKE 1 TABLET (25 MG TOTAL) BY MOUTH DAILY.   hydrOXYzine 25 MG capsule Commonly known as: VISTARIL Take 1 capsule (25 mg total) by mouth daily as needed for anxiety.   montelukast 10 MG tablet Commonly known as: SINGULAIR TAKE 1 TABLET DAILY FOR COUGH, NASAL DRAINAGE AND ALLERGIES   scopolamine 1 MG/3DAYS Commonly known as: TRANSDERM-SCOP Place 1 patch (1.5 mg total) onto the skin every 3 (three) days.   warfarin 5 MG tablet Commonly known as: COUMADIN Take as directed. If you are unsure  how to take this medication, talk to your nurse or doctor. Original instructions: TAKE 1 OR 2 TABLETS (5 OR 10MG ) DAILY AS DIRECTED BY PHYSICIAN What changed: See the new instructions.        Follow-up Information     Fisher, Demetrios Isaacs, MD. Call.   Specialty: Family Medicine Why: Dr. Theodis Aguas office will contact you to schedule a followup visit next week.  Please call them if you haven't heard anything from the office by Tuesday morning.  [phone] Contact information: [address] [place] [address] [place] [address] [phone]                Discharge Exam: Ceasar Mons Weights   08/07/22 1342 08/07/22 1409  Weight: 98.9 kg 100.5 kg   GEN: NAD SKIN: No rash EYES: EOMI ENT: MMM CV: RRR PULM: CTA B ABD: soft, ND, NT, +BS CNS: AAO x 3, non focal EXT: No edema or tenderness   Condition at discharge: good  The results of significant diagnostics from this hospitalization (including imaging, microbiology, ancillary and laboratory) are listed below for reference.   Imaging Studies: EEG adult  Result Date: August 26, 2022 Charlsie Quest, MD     August 26, 2022  1:21 PM Patient Name: Chase [name] MRN: [id number] Epilepsy Attending: Charlsie Quest Referring Physician/Provider: Gordy Councilman, MD Date: 08-26-2022 Duration: 33.23 mins Patient history: 71yo m with sudden onset aphasia and slurred speech . EEG to evaluate for seizure Level of alertness: Awake AEDs during EEG study: None Technical aspects: This EEG study was done with scalp electrodes positioned according to the 10-20 International system of electrode placement. Electrical activity was reviewed with band pass filter of 1-70Hz , sensitivity of 7 uV/mm, display speed of 62mm/sec with a 60Hz  notched filter applied as appropriate. EEG data were recorded continuously and digitally stored.  Video monitoring was available and reviewed as appropriate. Description: The posterior dominant rhythm consists of 8 Hz activity of  moderate voltage (25-35 uV) seen predominantly in posterior head regions, symmetric and reactive to eye opening and eye closing. Drowsiness was characterized by attenuation of the posterior background rhythm. Hyperventilation and photic stimulation were not performed.   IMPRESSION: This study is within normal limits. No seizures or epileptiform discharges were seen throughout the recording. A normal interictal EEG does not exclude the diagnosis of epilepsy. Charlsie Quest   MR BRAIN WO CONTRAST  Result Date: 08/07/2022 CLINICAL DATA:  Neuro deficit, acute, stroke suspected EXAM: MRI HEAD WITHOUT CONTRAST TECHNIQUE: Multiplanar, multiecho pulse sequences of the brain and surrounding structures were obtained without intravenous contrast. COMPARISON:  None Available. FINDINGS: Brain: No acute infarction, hemorrhage, hydrocephalus, extra-axial collection or mass lesion. Remote left thalamic infarct. Vascular: Normal flow voids. Skull and upper cervical spine: Nonspecific right calvarial lesion. Otherwise, normal marrow signal. Sinuses/Orbits: Negative. Other:  Small right mastoid effusion IMPRESSION: 1. No acute intracranial abnormality. 2. Old left thalamic infarct. 3. Nonspecific right calvarial lesion, most likely benign in the absence of a primary malignancy. Electronically Signed   By: Feliberto Harts M.D.   On: 08/07/2022 16:59   CT ANGIO HEAD NECK W WO CM (CODE STROKE)  Result Date: 08/07/2022 CLINICAL DATA:  Neuro deficit, acute, stroke suspected. EXAM: CT ANGIOGRAPHY HEAD AND NECK CT PERFUSION BRAIN TECHNIQUE: Multidetector CT imaging of the head and neck was performed using the standard protocol during bolus administration of intravenous contrast. Multiplanar CT image reconstructions and MIPs were obtained to evaluate the vascular anatomy. Carotid stenosis measurements (when applicable) are obtained utilizing NASCET criteria, using the distal internal carotid diameter as the denominator. Multiphase  CT imaging of the brain was performed following IV bolus contrast injection. Subsequent parametric perfusion maps were calculated using RAPID software. RADIATION DOSE REDUCTION: This exam was performed according to the departmental dose-optimization program which includes automated exposure control, adjustment of the mA and/or kV according to patient size and/or use of iterative reconstruction technique. CONTRAST:  OMNIPAQUE IOHEXOL 350 MG/ML SOLN COMPARISON:  Noncontrast head CT performed earlier today 08/07/2022. FINDINGS: CTA NECK FINDINGS Aortic arch: Standard aortic branching. Atherosclerotic plaque within the visualized aortic arch and proximal major branch vessels of the neck. No hemodynamically significant innominate or proximal subclavian artery stenosis. Right carotid system: CCA and ICA patent within the neck without measurable stenosis. Atherosclerotic plaque, greatest about the carotid bifurcation and within the proximal ICA. Left carotid system: CCA and ICA patent within the neck without measurable stenosis. Atherosclerotic plaque, greatest about the carotid bifurcation and within the proximal ICA. Vertebral arteries: Codominant patent within the neck. Nonstenotic calcified plaque at the origins of both vessels. Skeleton: Dextrocurvature of the upper thoracic spine at the imaged levels. C4-C5 grade 1 anterolisthesis. Cervical spondylosis. No acute fracture or aggressive osseous lesion. Other neck: No neck mass or cervical lymphadenopathy. Upper chest: No consolidation within the imaged lung apices. Right apical pleuroparenchymal scarring. Review of the MIP images confirms the above findings CTA HEAD FINDINGS Anterior circulation: The intracranial internal carotid arteries are patent. Atherosclerotic plaque within both vessels with no more than mild stenosis. The M1 middle cerebral arteries are patent. Atherosclerotic irregularity of the M2 and more distal MCA vessels bilaterally. No M2 proximal  branch occlusion or high-grade proximal stenosis. The anterior cerebral arteries are patent. No intracranial aneurysm is identified. Posterior circulation: The intracranial vertebral arteries are patent. The basilar artery is patent. The posterior cerebral arteries are patent. Atherosclerotic irregularity of both vessels. Most notably, there is a severe stenosis within a right PCA P3 segment branch (series 11, image 20). Hypoplastic right P1 segment with sizable right posterior communicating artery. The left posterior communicating artery is diminutive or absent. Venous sinuses: Within the limitations of contrast timing, no convincing thrombus. Anatomic variants: As described. Review of the MIP images confirms the above findings CT Brain Perfusion Findings: CBF (<30%) Volume: 0mL Perfusion (Tmax>6.0s) volume: NeuromL Mismatch Volume: 0mL Infarction Location:None identified No emergent large vessel occlusion identified. This result, CTA head impression #2 and the CT perfusion head results were communicated to Dr. Iver Nestle at 2:30 pmon 4/11/2024by text page via the [facility] messaging system. IMPRESSION: CTA neck: 1. The common carotid and internal carotid arteries are patent within the neck without stenosis. Atherosclerotic plaque bilaterally, greatest about the carotid bifurcations and within the proximal ICAs. 2. Vertebral arteries codominant and patent within the neck. Nonstenotic atherosclerotic plaque at the  origins of both vessels. 3.  Aortic Atherosclerosis (ICD10-I70.0). CTA head: 1. No intracranial large vessel occlusion identified. 2. Intracranial atherosclerotic disease, as described. Most notably, there is a severe stenosis within a right PCA P3 segment branch. CTA perfusion head: The perfusion software identifies no core infarct. The perfusion software identifies no critically hypoperfused parenchyma (utilizing the Tmax>6 seconds threshold). No mismatch volume reported Electronically Signed   By: Jackey Loge  D.O.   On: 08/07/2022 14:32   CT CEREBRAL PERFUSION W CONTRAST  Result Date: 08/07/2022 CLINICAL DATA:  Neuro deficit, acute, stroke suspected. EXAM: CT ANGIOGRAPHY HEAD AND NECK CT PERFUSION BRAIN TECHNIQUE: Multidetector CT imaging of the head and neck was performed using the standard protocol during bolus administration of intravenous contrast. Multiplanar CT image reconstructions and MIPs were obtained to evaluate the vascular anatomy. Carotid stenosis measurements (when applicable) are obtained utilizing NASCET criteria, using the distal internal carotid diameter as the denominator. Multiphase CT imaging of the brain was performed following IV bolus contrast injection. Subsequent parametric perfusion maps were calculated using RAPID software. RADIATION DOSE REDUCTION: This exam was performed according to the departmental dose-optimization program which includes automated exposure control, adjustment of the mA and/or kV according to patient size and/or use of iterative reconstruction technique. CONTRAST:  OMNIPAQUE IOHEXOL 350 MG/ML SOLN COMPARISON:  Noncontrast head CT performed earlier today 08/07/2022. FINDINGS: CTA NECK FINDINGS Aortic arch: Standard aortic branching. Atherosclerotic plaque within the visualized aortic arch and proximal major branch vessels of the neck. No hemodynamically significant innominate or proximal subclavian artery stenosis. Right carotid system: CCA and ICA patent within the neck without measurable stenosis. Atherosclerotic plaque, greatest about the carotid bifurcation and within the proximal ICA. Left carotid system: CCA and ICA patent within the neck without measurable stenosis. Atherosclerotic plaque, greatest about the carotid bifurcation and within the proximal ICA. Vertebral arteries: Codominant patent within the neck. Nonstenotic calcified plaque at the origins of both vessels. Skeleton: Dextrocurvature of the upper thoracic spine at the imaged levels. C4-C5 grade  1 anterolisthesis. Cervical spondylosis. No acute fracture or aggressive osseous lesion. Other neck: No neck mass or cervical lymphadenopathy. Upper chest: No consolidation within the imaged lung apices. Right apical pleuroparenchymal scarring. Review of the MIP images confirms the above findings CTA HEAD FINDINGS Anterior circulation: The intracranial internal carotid arteries are patent. Atherosclerotic plaque within both vessels with no more than mild stenosis. The M1 middle cerebral arteries are patent. Atherosclerotic irregularity of the M2 and more distal MCA vessels bilaterally. No M2 proximal branch occlusion or high-grade proximal stenosis. The anterior cerebral arteries are patent. No intracranial aneurysm is identified. Posterior circulation: The intracranial vertebral arteries are patent. The basilar artery is patent. The posterior cerebral arteries are patent. Atherosclerotic irregularity of both vessels. Most notably, there is a severe stenosis within a right PCA P3 segment branch (series 11, image 20). Hypoplastic right P1 segment with sizable right posterior communicating artery. The left posterior communicating artery is diminutive or absent. Venous sinuses: Within the limitations of contrast timing, no convincing thrombus. Anatomic variants: As described. Review of the MIP images confirms the above findings CT Brain Perfusion Findings: CBF (<30%) Volume: 0mL Perfusion (Tmax>6.0s) volume: NeuromL Mismatch Volume: 0mL Infarction Location:None identified No emergent large vessel occlusion identified. This result, CTA head impression #2 and the CT perfusion head results were communicated to Dr. Iver Nestle at 2:30 pmon 4/11/2024by text page via the [facility] messaging system. IMPRESSION: CTA neck: 1. The common carotid and internal carotid arteries are patent within  the neck without stenosis. Atherosclerotic plaque bilaterally, greatest about the carotid bifurcations and within the proximal ICAs. 2. Vertebral  arteries codominant and patent within the neck. Nonstenotic atherosclerotic plaque at the origins of both vessels. 3.  Aortic Atherosclerosis (ICD10-I70.0). CTA head: 1. No intracranial large vessel occlusion identified. 2. Intracranial atherosclerotic disease, as described. Most notably, there is a severe stenosis within a right PCA P3 segment branch. CTA perfusion head: The perfusion software identifies no core infarct. The perfusion software identifies no critically hypoperfused parenchyma (utilizing the Tmax>6 seconds threshold). No mismatch volume reported Electronically Signed   By: Jackey Loge D.O.   On: 08/07/2022 14:32   CT HEAD CODE STROKE WO CONTRAST  Result Date: 08/07/2022 CLINICAL DATA:  Code stroke. Aphasia. Abnormal speech noted at 12:49 p.m. EXAM: CT HEAD WITHOUT CONTRAST TECHNIQUE: Contiguous axial images were obtained from the base of the skull through the vertex without intravenous contrast. RADIATION DOSE REDUCTION: This exam was performed according to the departmental dose-optimization program which includes automated exposure control, adjustment of the mA and/or kV according to patient size and/or use of iterative reconstruction technique. COMPARISON:  None Available. FINDINGS: Brain: A focal area of hypoattenuation in the anterior left thalamus measures up to 7 mm. The brain nuclei are otherwise within normal limits. Mild generalized white matter hypoattenuation is present. No acute cortical infarcts are present. No acute hemorrhage or mass lesion is present. The ventricles are of normal size. No significant extraaxial fluid collection is present. The brainstem and cerebellum are within normal limits. Midline structures are within normal limits. Vascular: Atherosclerotic calcifications are present within the cavernous internal carotid arteries bilaterally. No hyperdense vessel is present. Skull: Calvarium is intact. No focal lytic or blastic lesions are present. No significant  extracranial soft tissue lesion is present. Sinuses/Orbits: A small right mastoid effusion is present. Debris in the right external auditory canal likely represents cerumen. The paranasal sinuses and mastoid air cells are otherwise clear. The globes and orbits are within normal limits. ASPECTS ([place] Stroke Program Early CT Score) - Ganglionic level infarction (caudate, lentiform nuclei, internal capsule, insula, M1-M3 cortex): 7/7 - Supraganglionic infarction (M4-M6 cortex): 3/3 Total score (0-10 with 10 being normal): 10/10 IMPRESSION: 1. 7 mm focal area of hypoattenuation in the anterior left thalamus likely represents an age-indeterminate infarct. 2. No acute cortical infarcts. 3. Mild generalized white matter disease likely reflects the sequela of chronic microvascular ischemia. 4. Small right mastoid effusion. The above was relayed via text pager to Dr. Rollene Fare on 08/07/2022 at 13:45 . Electronically Signed   By: Marin Roberts M.D.   On: 08/07/2022 13:46    Microbiology: Results for orders placed or performed during the hospital encounter of 06/29/12  Surgical pcr screen     Status: None   Collection Time: 06/29/12 11:39 PM   Specimen: Nasal Mucosa; Nasal Swab  Result Value Ref Range Status   MRSA, PCR NEGATIVE NEGATIVE Final   Staphylococcus aureus NEGATIVE NEGATIVE Final    Comment:        The Xpert SA Assay (FDA approved for NASAL specimens in patients over 72 years of age), is one component of a comprehensive surveillance program.  Test performance has been validated by [employer] for patients greater than or equal to 72 year old. It is not intended to diagnose infection nor to guide or monitor treatment.    Labs: CBC: Recent Labs  Lab 08/07/22 1326 08/08/22 0221  WBC 4.9 4.2  NEUTROABS 3.1 2.3  HGB 12.5*  12.4*  HCT 35.4* 34.7*  MCV 93.4 93.0  PLT 177 174   Basic Metabolic Panel: Recent Labs  Lab 08/07/22 1326 08/07/22 1944 08/08/22 0221 08/08/22 1013  08/08/22 1354  NA 124* 125* 127* 131* 133*  K 3.6 3.4* 3.7 3.9 4.0  CL 90* 93* 98 100 99  CO2 23 23 23 23 25   GLUCOSE 91 179* 95 109* 140*  BUN 10 11 14 14 13   CREATININE 0.87 0.80 0.77 0.87 0.87  CALCIUM 8.7* 8.5* 8.2* 8.5* 8.7*  MG 1.6*  --  2.0  --   --   PHOS  --   --  3.8  --   --    Liver Function Tests: Recent Labs  Lab 08/07/22 1326  AST 26  ALT 18  ALKPHOS 41  BILITOT 0.7  PROT 6.9  ALBUMIN 4.0   CBG: Recent Labs  Lab 08/07/22 1328  GLUCAP 88    Discharge time spent: greater than 30 minutes.  Signed: Lurene Shadow, MD [facility] 08/09/2022

## 2022-08-08 NOTE — Evaluation (Signed)
Physical Therapy Evaluation Patient Details Name: Chase [name] MRN: [id number] DOB: Apr 19, 1951 Today's Date: 08/08/2022  History of Present Illness  Pt is a 72 y.o. male with medical history significant of CVA due to DVT with PFO on warfarin, alcohol use disorder, hypertension, hyperlipidemia, depression/anxiety, tardive dyskinesia, who presents to the ED due to difficulty speaking.   Clinical Impression  Patient alert,agreeable to PT, asking to perform some ADLs. He was independent for all mobility and ADLs including toileting, teeth brushing, washing his face. Ambulated in hallway independently DGI indicating low fall risk. The patient demonstrated and reported return to baseline level of functioning, no further acute PT needs indicated. PT to sign off. Please reconsult PT if pt status changes or acute needs are identified.         Recommendations for follow up therapy are one component of a multi-disciplinary discharge planning process, led by the attending physician.  Recommendations may be updated based on patient status, additional functional criteria and insurance authorization.  Follow Up Recommendations       Assistance Recommended at Discharge None  Patient can return home with the following       Equipment Recommendations None recommended by PT  Recommendations for Other Services       Functional Status Assessment Patient has not had a recent decline in their functional status     Precautions / Restrictions Precautions Precautions: Fall Restrictions Weight Bearing Restrictions: No      Mobility  Bed Mobility Overal bed mobility: Independent                  Transfers Overall transfer level: Independent                      Ambulation/Gait Ambulation/Gait assistance: Independent                Information systems manager Rankin (Stroke Patients Only)       Balance Overall balance assessment:  Independent                               Standardized Balance Assessment Standardized Balance Assessment : Dynamic Gait Index   Dynamic Gait Index Level Surface: Normal Change in Gait Speed: Normal Gait with Horizontal Head Turns: Normal Gait with Vertical Head Turns: Normal Gait and Pivot Turn: Normal Step Over Obstacle: Mild Impairment Step Around Obstacles: Normal Steps: Normal (clinical judgement) Total Score: 23       Pertinent Vitals/Pain Pain Assessment Pain Assessment: No/denies pain    Home Living Family/patient expects to be discharged to:: Private residence Living Arrangements: Spouse/significant other Available Help at Discharge: Family;Available 24 hours/day Type of Home: House Home Access: Stairs to enter Entrance Stairs-Rails: None Entrance Stairs-Number of Steps: 2   Home Layout: One level        Prior Function Prior Level of Function : Independent/Modified Independent;Driving                     Hand Dominance        Extremity/Trunk Assessment   Upper Extremity Assessment Upper Extremity Assessment: Generalized weakness    Lower Extremity Assessment Lower Extremity Assessment: Generalized weakness    Cervical / Trunk Assessment Cervical / Trunk Assessment: Normal  Communication   Communication: No difficulties  Cognition Arousal/Alertness: Awake/alert Behavior During Therapy: WFL for tasks assessed/performed  Overall Cognitive Status: Within Functional Limits for tasks assessed                                          General Comments      Exercises     Assessment/Plan    PT Assessment Patient does not need any further PT services  PT Problem List         PT Treatment Interventions      PT Goals (Current goals can be found in the Care Plan section)       Frequency       Co-evaluation               AM-PAC PT "6 Clicks" Mobility  Outcome Measure Help needed turning from  your back to your side while in a flat bed without using bedrails?: None Help needed moving from lying on your back to sitting on the side of a flat bed without using bedrails?: None Help needed moving to and from a bed to a chair (including a wheelchair)?: None Help needed standing up from a chair using your arms (e.g., wheelchair or bedside chair)?: None Help needed to walk in hospital room?: None Help needed climbing 3-5 steps with a railing? : None 6 Click Score: 24    End of Session   Activity Tolerance: Patient tolerated treatment well Patient left: in bed;with family/visitor present Nurse Communication: Mobility status PT Visit Diagnosis: Other abnormalities of gait and mobility (R26.89)    Time: 1030-1314 PT Time Calculation (min) (ACUTE ONLY): 22 min   Charges:   PT Evaluation $PT Eval Low Complexity: 1 Low PT Treatments $Therapeutic Activity: 8-22 mins       Olga Coaster PT, DPT 11:05 AM,08/08/22

## 2022-08-08 NOTE — Progress Notes (Signed)
Neurology Progress Note  Subjective: - Back to baseline, no new complaints or further concerns  Exam: Current vital signs: BP 119/80 (BP Location: Right Arm)   Pulse 63   Temp 98 F (36.7 C)   Resp 18   Ht 5' 10.5" (1.791 m)   Wt 100.5 kg   SpO2 95%   BMI 31.35 kg/m  Vital signs in last 24 hours: Temp:  [97.9 F (36.6 C)-98.4 F (36.9 C)] 98 F (36.7 C) (04/12 0818) Pulse Rate:  [63-87] 63 (04/12 0818) Resp:  [11-23] 18 (04/12 0818) BP: (98-152)/(59-89) 119/80 (04/12 0818) SpO2:  [94 %-100 %] 95 % (04/12 0818) Weight:  [98.9 kg-100.5 kg] 100.5 kg (04/11 1409)   Gen: In bed, comfortable  Resp: non-labored breathing, no grossly audible wheezing Cardiac: Perfusing extremities well  Abd: soft, nt  Neuro: Awake, alert, fluent speech intact naming and repetition Face symmetric, tongue midline, EOMI Finger to nose with mild end reach tremor bilaterally  Using all 4 extremities grossly equally   Pertinent Labs:  Basic Metabolic Panel: Recent Labs  Lab 08/07/22 1326 08/07/22 1944 08/08/22 0221 08/08/22 1013 08/08/22 1354  NA 124* 125* 127* 131* 133*  K 3.6 3.4* 3.7 3.9 4.0  CL 90* 93* 98 100 99  CO2 23 23 23 23 25   GLUCOSE 91 179* 95 109* 140*  BUN 10 11 14 14 13   CREATININE 0.87 0.80 0.77 0.87 0.87  CALCIUM 8.7* 8.5* 8.2* 8.5* 8.7*  MG 1.6*  --  2.0  --   --   PHOS  --   --  3.8  --   --     CBC: Recent Labs  Lab 08/07/22 1326 08/08/22 0221  WBC 4.9 4.2  NEUTROABS 3.1 2.3  HGB 12.5* 12.4*  HCT 35.4* 34.7*  MCV 93.4 93.0  PLT 177 174    Coagulation Studies: Recent Labs    08/07/22 1326  LABPROT 25.0*  INR 2.3*   Lab Results  Component Value Date   HGBA1C 5.4 08/07/2022   Lab Results  Component Value Date   CHOL 153 08/08/2022   HDL 51 08/08/2022   LDLCALC 90 08/08/2022   TRIG 60 08/08/2022   CHOLHDL 3.0 08/08/2022   B12 low normal at 192    EEG within normal limits   Impression: Most likely metabolic encephalopathy secondary to  symptomatic hyponatremia. Patient pre-contemplative re: ethanol and tobacco cessation, counseling performed. EEG reassuring, will not start any antiseizure medications, neurologically appropriate for discharge and outpatient follow-up at this time, family prefers re-establishing with Dr. Corwin Levins at [facility]. Appreciate management of comorbidities and medical clearance per primary team  Recommendations:  # TIA vs. Metabolic encephalopathy secondary to hyponatremia vs. Focal seizure  - A1c meeting goal  - LDL 90, start atorvastatin 10 mg for goal < 70 - Continue warfarin  - Goal normotension   # hyponatremia, improving - Appreciate management by primary team  # Boderline B12 level - 1000 mcg B12 daily PO for goal > 400  - Agree with folate and multivitamin  Brooke Dare MD-PhD [facility] [phone]   [facility] coverage for [facility] is from 8 AM to 4 AM in-house and 4 PM to 8 PM by telephone/video. 8 PM to 8 AM emergent questions or overnight urgent questions should be addressed to Teleneurology On-call or [facility] neurohospitalist; contact information can be found on AMION

## 2022-08-08 NOTE — Progress Notes (Signed)
ANTICOAGULATION CONSULT NOTE - Initial Consult  Pharmacy Consult for Warfarin Indication: hx DVT  Allergies  Allergen Reactions   Klonopin [Clonazepam] Palpitations   Chantix [Varenicline Tartrate]     confusion    Patient Measurements: Height: 5' 10.5" (179.1 cm) Weight: 100.5 kg (221 lb 9.6 oz) IBW/kg (Calculated) : 74.15 Heparin Dosing Weight:    Vital Signs: Temp: 98 F (36.7 C) (04/12 0818) BP: 119/80 (04/12 0818) Pulse Rate: 63 (04/12 0818)  Labs: Recent Labs    08/07/22 1326 08/07/22 1944 08/08/22 0221 08/08/22 1013  HGB 12.5*  --  12.4*  --   HCT 35.4*  --  34.7*  --   PLT 177  --  174  --   APTT 44*  --   --   --   LABPROT 25.0*  --   --  22.6*  INR 2.3*  --   --  2.0*  CREATININE 0.87 0.80 0.77 0.87    Estimated Creatinine Clearance: 93.3 mL/min (by C-G formula based on SCr of 0.87 mg/dL).   Medical History: Past Medical History:  Diagnosis Date   Cerebrovascular accident (CVA) 10/09/2011   Clotting disorder    DVT (deep venous thrombosis)    Hearing deficit    History of ischemic vertebrobasilar artery thalamic stroke 2001   History of stroke    Hyperlipidemia    Hypertension    Patent foramen ovale    Right knee DJD    Stroke 2001    Medications:  Medications Prior to Admission  Medication Sig Dispense Refill Last Dose   amLODipine-benazepril (LOTREL) 5-10 MG capsule TAKE 1 CAPSULE EVERY DAY (NEED MD APPOINTMENT) 90 capsule 4 08/07/2022   amoxicillin (AMOXIL) 500 MG tablet Take by mouth as directed. 1 hour prior to dental procedures   prn at unk   busPIRone (BUSPAR) 7.5 MG tablet Take 1 tablet (7.5 mg total) by mouth as directed. Take 1 tablet daily AM and half tablet daily at nigh 135 tablet 1 08/07/2022   [START ON 08/28/2022] DULoxetine (CYMBALTA) 60 MG capsule Take 1 capsule (60 mg total) by mouth daily. 90 capsule 1 08/07/2022   fluticasone (FLONASE) 50 MCG/ACT nasal spray Place 1 spray into both nostrils as needed.    08/07/2022    hydrochlorothiazide (HYDRODIURIL) 25 MG tablet TAKE 1 TABLET (25 MG TOTAL) BY MOUTH DAILY. 90 tablet 4 08/07/2022   hydrOXYzine (VISTARIL) 25 MG capsule Take 1 capsule (25 mg total) by mouth daily as needed for anxiety. 90 capsule 1 prn at unk   montelukast (SINGULAIR) 10 MG tablet TAKE 1 TABLET DAILY FOR COUGH, NASAL DRAINAGE AND ALLERGIES 90 tablet 3 08/06/2022   scopolamine (TRANSDERM-SCOP) 1 MG/3DAYS Place 1 patch (1.5 mg total) onto the skin every 3 (three) days. 4 patch 0 prn at unk   warfarin (COUMADIN) 5 MG tablet TAKE 1 OR 2 TABLETS (5 OR 10MG ) DAILY AS DIRECTED BY PHYSICIAN (Patient taking differently: 5 mg. TAKE 1 OR 2 TABLETS (5 OR 10MG ) DAILY AS DIRECTED BY PHYSICIAN 2 tablets MONDAY AND FRIDAY and 1 tab other days) 180 tablet 3 08/06/2022 at 2100   clonazePAM (KLONOPIN) 0.5 MG tablet Take 0.5 mg by mouth as directed. Take 1 tablet 20 minutes before a flight (Patient not taking: Reported on 07/30/2022)   Not Taking   Scheduled:   atorvastatin  10 mg Oral QHS   busPIRone  7.5 mg Oral BID   vitamin B-12  1,000 mcg Oral Daily   DULoxetine  60 mg Oral Daily  folic acid  1 mg Oral Daily   montelukast  10 mg Oral QHS   multivitamin with minerals  1 tablet Oral Daily   sodium chloride flush  3 mL Intravenous Once   Infusions:   thiamine (VITAMIN B1) injection 500 mg (08/08/22 4098)    Assessment: 72 yo M to resume Warfarin treatment for previous DVT. Outpatient regimen:  Warfarin 5 mg Tues, Wed, Thur, Sat, Sun Warfarin 10 mg Mon, Fri Last warfarin dose 4/10 pm Hgb 12.4  Plt 174    4/11  INR 2.3 No warfarin 4/12  INR 2.0  Goal of Therapy:  INR 2-3 Monitor platelets by anticoagulation protocol: Yes   Plan:  Will order warfarin 10 mg po x 1 dose (see PTA dosing) Will f/u INR daily and CBC per protocol  Stefania Goulart A 08/08/2022,10:59 AM

## 2022-08-09 DIAGNOSIS — G9341 Metabolic encephalopathy: Secondary | ICD-10-CM | POA: Diagnosis present

## 2022-08-09 HISTORY — DX: Metabolic encephalopathy: G93.41

## 2022-08-09 LAB — VITAMIN B1: Vitamin B1 (Thiamine): 79.8 nmol/L (ref 66.5–200.0)

## 2022-08-11 NOTE — Telephone Encounter (Signed)
Appt 08/12/22 at 10;40 a.m.

## 2022-08-11 NOTE — Progress Notes (Unsigned)
   Vivien Rota DeSanto,acting as a scribe for Mila Merry, MD.,have documented all relevant documentation on the behalf of Mila Merry, MD,as directed by  Mila Merry, MD while in the presence of Mila Merry, MD.     Established patient visit   [name]   DOB: 06-25-1950   72 y.o. Male  MRN: [id number] Visit Date: 08/12/2022  Today's healthcare provider: Mila Merry, MD   No chief complaint on file.  Subjective    HPI  Follow up Hospitalization  Patient was admitted to [facility] on 08/07/2022 and discharged on 08/09/2022. He was treated for   Discharge Diagnoses: Principal Problem:   TIA (transient ischemic attack) Active Problems:   Hyponatremia   Alcohol use disorder, moderate, dependence   Shuffling gait   History of DVT in adulthood   Primary hypertension   Emphysema lung   Acute metabolic encephalopathy.  Recommendations at discharge:   Follow-up with PCP in 1 to 2 weeks      He reports {excellent/good/fair:19992} compliance with treatment. He reports this condition is {resolved/improved/worsened:23923}.  ----------------------------------------------------------------------------------------- -   Medications: Outpatient Medications Prior to Visit  Medication Sig   amLODipine-benazepril (LOTREL) 5-10 MG capsule TAKE 1 CAPSULE EVERY DAY (NEED MD APPOINTMENT)   amoxicillin (AMOXIL) 500 MG tablet Take by mouth as directed. 1 hour prior to dental procedures   atorvastatin (LIPITOR) 10 MG tablet Take 1 tablet (10 mg total) by mouth at bedtime.   busPIRone (BUSPAR) 7.5 MG tablet Take 1 tablet (7.5 mg total) by mouth as directed. Take 1 tablet daily AM and half tablet daily at nigh   cyanocobalamin 1000 MCG tablet Take 1 tablet (1,000 mcg total) by mouth daily.   [START ON 08/28/2022] DULoxetine (CYMBALTA) 60 MG capsule Take 1 capsule (60 mg total) by mouth daily.   fluticasone (FLONASE) 50 MCG/ACT nasal spray Place 1 spray into both nostrils as  needed.    hydrochlorothiazide (HYDRODIURIL) 25 MG tablet TAKE 1 TABLET (25 MG TOTAL) BY MOUTH DAILY.   hydrOXYzine (VISTARIL) 25 MG capsule Take 1 capsule (25 mg total) by mouth daily as needed for anxiety.   montelukast (SINGULAIR) 10 MG tablet TAKE 1 TABLET DAILY FOR COUGH, NASAL DRAINAGE AND ALLERGIES   scopolamine (TRANSDERM-SCOP) 1 MG/3DAYS Place 1 patch (1.5 mg total) onto the skin every 3 (three) days.   warfarin (COUMADIN) 5 MG tablet TAKE 1 OR 2 TABLETS (5 OR 10MG ) DAILY AS DIRECTED BY PHYSICIAN (Patient taking differently: 5 mg. TAKE 1 OR 2 TABLETS (5 OR 10MG ) DAILY AS DIRECTED BY PHYSICIAN 2 tablets MONDAY AND FRIDAY and 1 tab other days)   No facility-administered medications prior to visit.    Review of Systems  {Labs  Heme  Chem  Endocrine  Serology  Results Review (optional):23779}   Objective    There were no vitals taken for this visit. {Show previous vital signs (optional):23777}  Physical Exam  ***  No results found for any visits on 08/12/22.  Assessment & Plan     ***  No follow-ups on file.      {provider attestation***:1}   Mila Merry, MD  [facility] [phone] (phone) [phone] (fax)  [facility]

## 2022-08-12 ENCOUNTER — Ambulatory Visit (INDEPENDENT_AMBULATORY_CARE_PROVIDER_SITE_OTHER): Payer: Medicare Other | Admitting: Family Medicine

## 2022-08-12 VITALS — BP 137/96 | HR 69 | Temp 98.3°F | Wt 213.0 lb

## 2022-08-12 DIAGNOSIS — F17200 Nicotine dependence, unspecified, uncomplicated: Secondary | ICD-10-CM | POA: Diagnosis not present

## 2022-08-12 DIAGNOSIS — R29818 Other symptoms and signs involving the nervous system: Secondary | ICD-10-CM | POA: Diagnosis not present

## 2022-08-12 DIAGNOSIS — E871 Hypo-osmolality and hyponatremia: Secondary | ICD-10-CM | POA: Diagnosis not present

## 2022-08-12 MED ORDER — BUSPIRONE HCL 7.5 MG PO TABS: 7.5000 mg | ORAL_TABLET | ORAL | 1 refills | Status: DC

## 2022-08-13 ENCOUNTER — Other Ambulatory Visit: Payer: Self-pay | Admitting: Family Medicine

## 2022-08-13 ENCOUNTER — Ambulatory Visit: Payer: Medicare Other

## 2022-08-13 DIAGNOSIS — I10 Essential (primary) hypertension: Secondary | ICD-10-CM

## 2022-08-13 DIAGNOSIS — E871 Hypo-osmolality and hyponatremia: Secondary | ICD-10-CM

## 2022-08-13 LAB — RENAL FUNCTION PANEL
Albumin: 4.3 g/dL (ref 3.8–4.8)
BUN/Creatinine Ratio: 10 (ref 10–24)
BUN: 9 mg/dL (ref 8–27)
CO2: 20 mmol/L (ref 20–29)
Calcium: 9.4 mg/dL (ref 8.6–10.2)
Chloride: 96 mmol/L (ref 96–106)
Creatinine, Ser: 0.92 mg/dL (ref 0.76–1.27)
Glucose: 100 mg/dL — ABNORMAL HIGH (ref 70–99)
Phosphorus: 3.4 mg/dL (ref 2.8–4.1)
Potassium: 4.4 mmol/L (ref 3.5–5.2)
Sodium: 135 mmol/L (ref 134–144)
eGFR: 89 mL/min/{1.73_m2} (ref >59)

## 2022-08-13 LAB — TSH: TSH: 3.31 u[IU]/mL (ref 0.450–4.500)

## 2022-08-13 LAB — MAGNESIUM: Magnesium: 2.2 mg/dL (ref 1.6–2.3)

## 2022-08-13 MED ORDER — HYDROCHLOROTHIAZIDE 12.5 MG PO CAPS
12.5000 mg | ORAL_CAPSULE | Freq: Every day | ORAL | 0 refills | Status: DC
Start: 2022-08-13 — End: 2022-09-09

## 2022-08-13 MED ORDER — SPIRONOLACTONE 25 MG PO TABS
12.5000 mg | ORAL_TABLET | Freq: Every day | ORAL | 0 refills | Status: DC
Start: 2022-08-13 — End: 2022-09-09

## 2022-09-02 ENCOUNTER — Other Ambulatory Visit: Payer: Self-pay | Admitting: Family Medicine

## 2022-09-02 DIAGNOSIS — I10 Essential (primary) hypertension: Secondary | ICD-10-CM

## 2022-09-03 ENCOUNTER — Other Ambulatory Visit: Payer: Self-pay | Admitting: Family Medicine

## 2022-09-03 MED ORDER — ATORVASTATIN CALCIUM 10 MG PO TABS: 10.0000 mg | ORAL_TABLET | Freq: Every day | ORAL | 0 refills | Status: DC

## 2022-09-03 NOTE — Telephone Encounter (Signed)
Requested medication (s) are due for refill today:Due 09/07/22  Requested medication (s) are on the active medication list: yes    Last refill: 08/08/22  #30  0 refills  Future visit scheduled yes 10/10/22  Notes to clinic:Historical Provider, please review. Thank you.  Requested Prescriptions  Pending Prescriptions Disp Refills   atorvastatin (LIPITOR) 10 MG tablet 30 tablet 0    Sig: Take 1 tablet (10 mg total) by mouth at bedtime.     Cardiovascular:  Antilipid - Statins Failed - 09/03/2022  1:26 PM      Failed - Lipid Panel in normal range within the last 12 months    Cholesterol, Total  Date Value Ref Range Status  06/25/2021 184 100 - 199 mg/dL Final   Cholesterol  Date Value Ref Range Status  08/08/2022 153 0 - 200 mg/dL Final   LDL Chol Calc (NIH)  Date Value Ref Range Status  06/25/2021 112 (H) 0 - 99 mg/dL Final   LDL Cholesterol  Date Value Ref Range Status  08/08/2022 90 0 - 99 mg/dL Final    Comment:           Total Cholesterol/HDL:CHD Risk Coronary Heart Disease Risk Table                     Men   Women  1/2 Average Risk   3.4   3.3  Average Risk       5.0   4.4  2 X Average Risk   9.6   7.1  3 X Average Risk  23.4   11.0        Use the calculated Patient Ratio above and the CHD Risk Table to determine the patient's CHD Risk.        ATP III CLASSIFICATION (LDL):  <100     mg/dL   Optimal  161-096  mg/dL   Near or Above                    Optimal  130-159  mg/dL   Borderline  045-409  mg/dL   High  >811     mg/dL   Very High Performed at [facility], [address] [place], [place] [address]    HDL  Date Value Ref Range Status  08/08/2022 51 >40 mg/dL Final  29/56/2130 60 >86 mg/dL Final   Triglycerides  Date Value Ref Range Status  08/08/2022 60 <150 mg/dL Final         Passed - Patient is not pregnant      Passed - Valid encounter within last 12 months    Recent Outpatient Visits           3 weeks ago Transient  neurologic deficit   [facility] [name], MD   1 year ago Medicare annual wellness visit, subsequent   [facility] [name], MD   1 year ago Primary hypertension   [facility] [name], MD   2 years ago Primary hypertension   [facility] [name], MD   2 years ago Essential hypertension   [facility] [name], MD       Future Appointments             In 1 month Fisher, Demetrios Isaacs, MD [facility]

## 2022-09-03 NOTE — Telephone Encounter (Signed)
Medication Refill - Medication: atorvastatin (LIPITOR) 10 MG tablet   Has the patient contacted their pharmacy? Yes.   (Agent: If no, request that the patient contact the pharmacy for the refill. If patient does not wish to contact the pharmacy document the reason why and proceed with request.) (Agent: If yes, when and what did the pharmacy advise?)  Preferred Pharmacy (with phone number or street name):  [facility] [address] [place] - [address]  [address] [place] [address]  Phone: [phone] Fax: [phone]   Has the patient been seen for an appointment in the last year OR does the patient have an upcoming appointment? Yes.    Agent: Please be advised that RX refills may take up to 3 business days. We ask that you follow-up with your pharmacy.

## 2022-09-05 ENCOUNTER — Other Ambulatory Visit: Payer: Self-pay | Admitting: Family Medicine

## 2022-09-05 DIAGNOSIS — I10 Essential (primary) hypertension: Secondary | ICD-10-CM

## 2022-09-09 ENCOUNTER — Other Ambulatory Visit: Payer: Self-pay | Admitting: Family Medicine

## 2022-09-09 DIAGNOSIS — I10 Essential (primary) hypertension: Secondary | ICD-10-CM

## 2022-09-10 ENCOUNTER — Ambulatory Visit (INDEPENDENT_AMBULATORY_CARE_PROVIDER_SITE_OTHER): Payer: Medicare Other

## 2022-09-10 DIAGNOSIS — Z86718 Personal history of other venous thrombosis and embolism: Secondary | ICD-10-CM

## 2022-09-10 DIAGNOSIS — E871 Hypo-osmolality and hyponatremia: Secondary | ICD-10-CM

## 2022-09-10 LAB — POCT INR
INR: 3.1 — AB (ref 2.0–3.0)
POC INR: 3.1
PT: 38.9

## 2022-09-10 MED ORDER — HYDROCHLOROTHIAZIDE 12.5 MG PO CAPS
12.5000 mg | ORAL_CAPSULE | Freq: Every day | ORAL | 0 refills | Status: DC
Start: 2022-09-10 — End: 2022-10-02

## 2022-09-10 MED ORDER — SPIRONOLACTONE 25 MG PO TABS
12.5000 mg | ORAL_TABLET | Freq: Every day | ORAL | 0 refills | Status: DC
Start: 2022-09-10 — End: 2022-11-27

## 2022-09-10 NOTE — Patient Instructions (Signed)
Description   Hold 1 day then resume 5 mg daily except 10 mg M - F F/U 2 weeks

## 2022-09-11 DIAGNOSIS — H43313 Vitreous membranes and strands, bilateral: Secondary | ICD-10-CM | POA: Diagnosis not present

## 2022-09-15 DIAGNOSIS — D2272 Melanocytic nevi of left lower limb, including hip: Secondary | ICD-10-CM | POA: Diagnosis not present

## 2022-09-15 DIAGNOSIS — D225 Melanocytic nevi of trunk: Secondary | ICD-10-CM | POA: Diagnosis not present

## 2022-09-15 DIAGNOSIS — D2261 Melanocytic nevi of right upper limb, including shoulder: Secondary | ICD-10-CM | POA: Diagnosis not present

## 2022-09-15 DIAGNOSIS — L821 Other seborrheic keratosis: Secondary | ICD-10-CM | POA: Diagnosis not present

## 2022-09-15 DIAGNOSIS — D2271 Melanocytic nevi of right lower limb, including hip: Secondary | ICD-10-CM | POA: Diagnosis not present

## 2022-09-15 DIAGNOSIS — D2262 Melanocytic nevi of left upper limb, including shoulder: Secondary | ICD-10-CM | POA: Diagnosis not present

## 2022-09-20 IMAGING — CT CT CHEST LUNG CANCER SCREENING LOW DOSE W/O CM
2 of 5 series · 14 of 40 positions shown, 17 images · non-contrast
Comparison: None.

CLINICAL DATA: 69-year-old male with 98 pack-year history of
smoking. Lung cancer screening.

EXAM:
CT CHEST WITHOUT CONTRAST LOW-DOSE FOR LUNG CANCER SCREENING
TECHNIQUE: Multidetector CT imaging of the chest was performed following the
standard protocol without IV contrast.

[Series 3: lung 1.00 · axial · 0.79mm/px · z∈[-1225,-916]mm · 11 of 341 slices shown, 14 images]
[im 16/341  mediastinal]
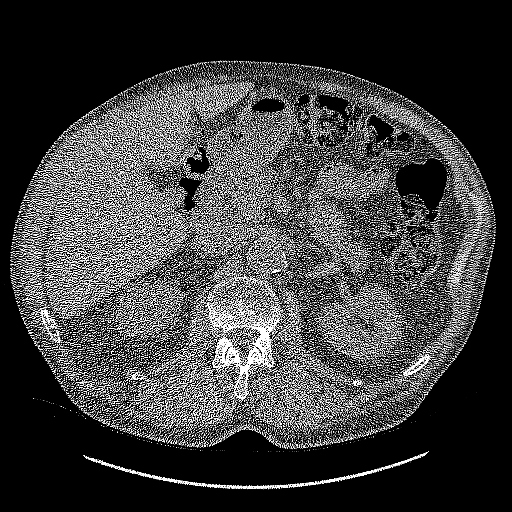
[im 16/341  lung]
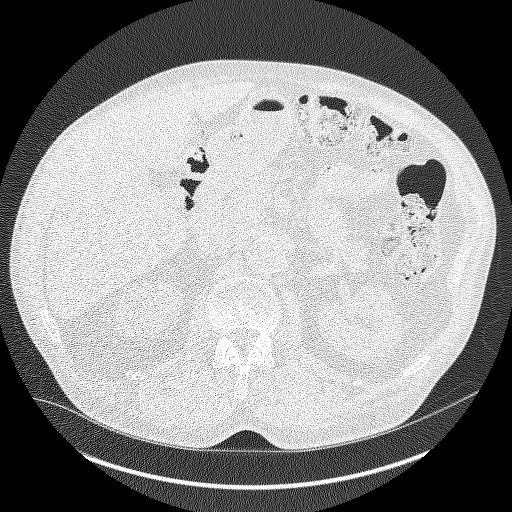
[im 47/341  lung]
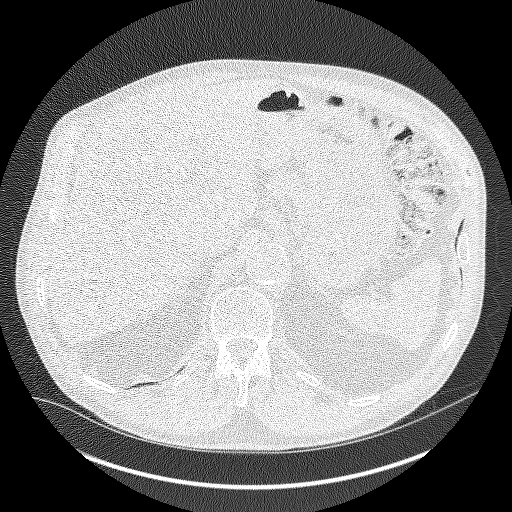
[im 78/341  lung]
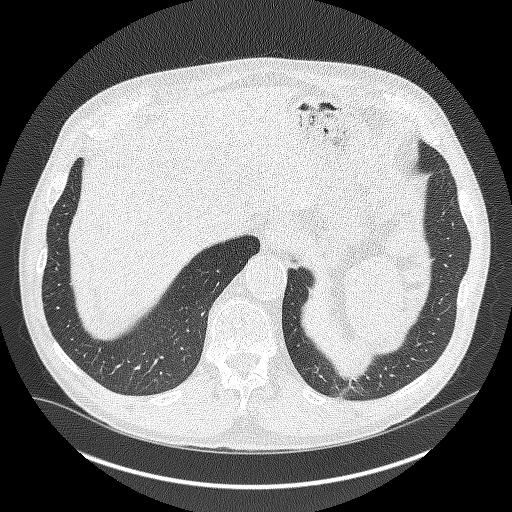
[im 109/341  lung]
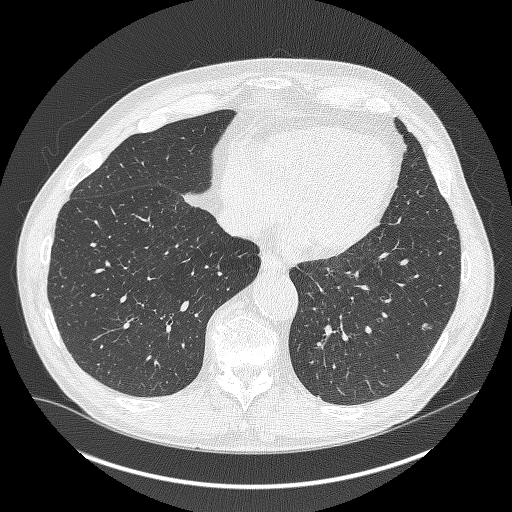
[im 140/341  mediastinal]
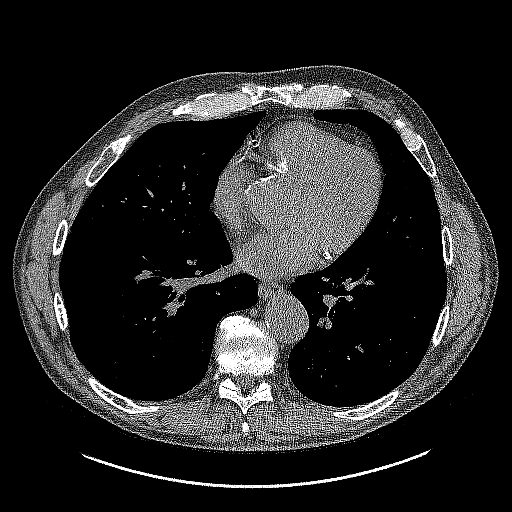
[im 140/341  lung]
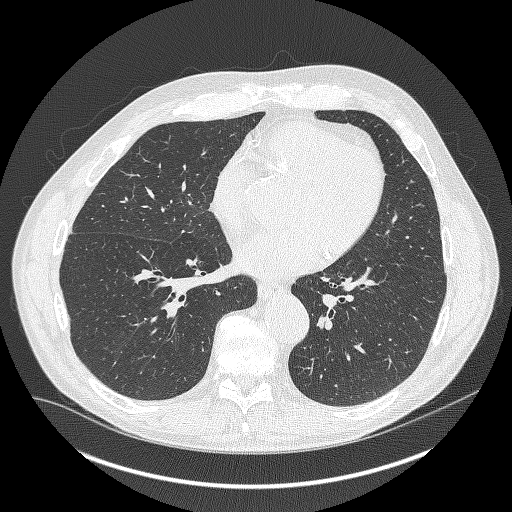
[im 171/341  lung]
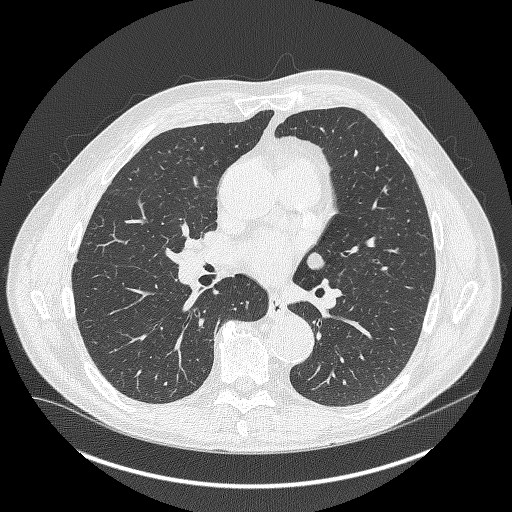
[im 201/341  lung]
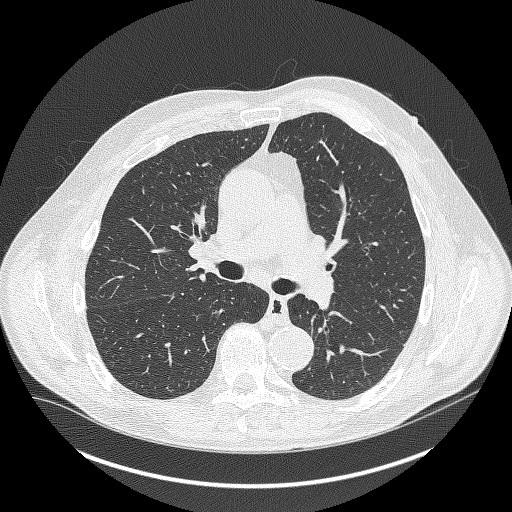
[im 232/341  lung]
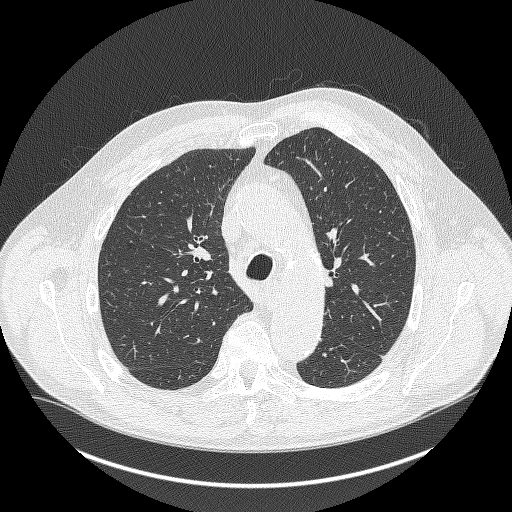
[im 263/341  mediastinal]
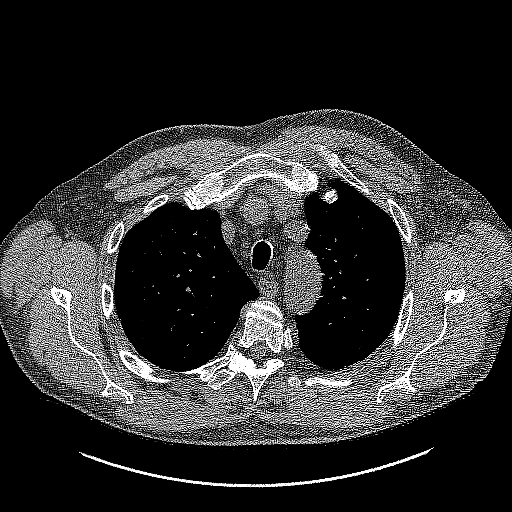
[im 263/341  lung]
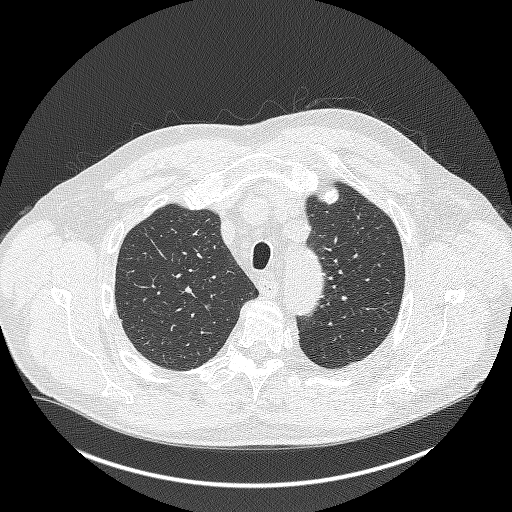
[im 294/341  lung]
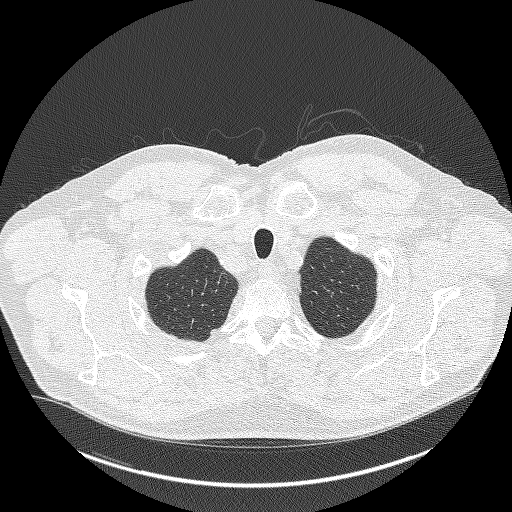
[im 325/341  lung]
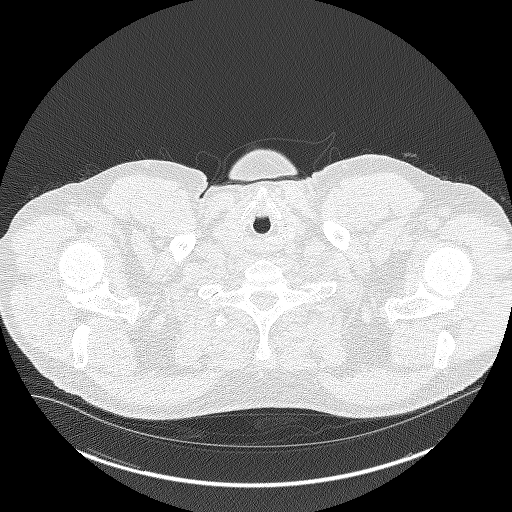

[Series 5: coronals lung 1.00 cor · coronal · 0.67mm/px · 3 of 303 slices shown]
[im 61/303  lung]
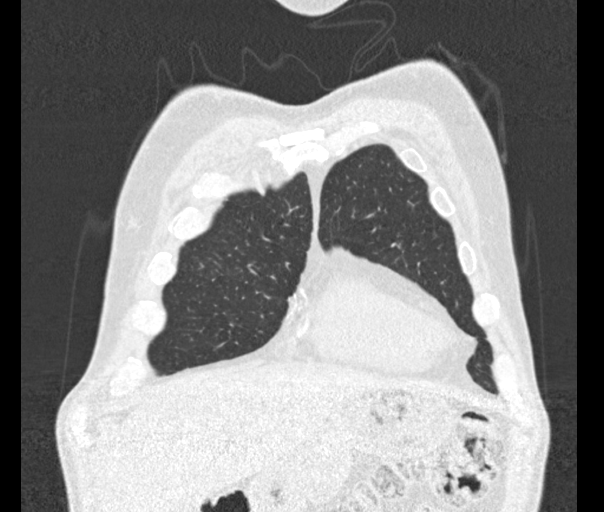
[im 121/303  lung]
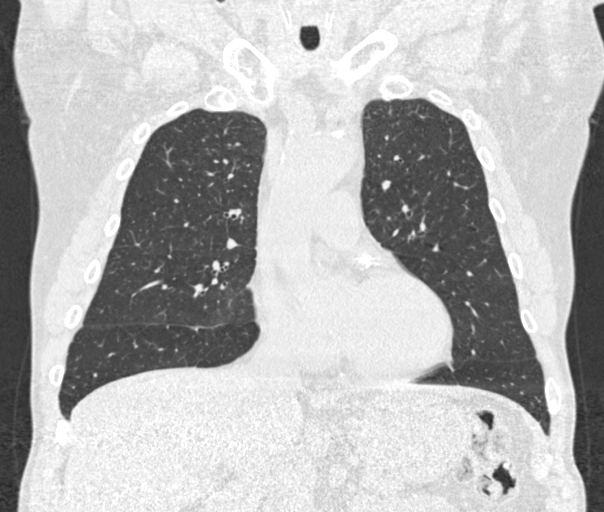
[im 182/303  lung]
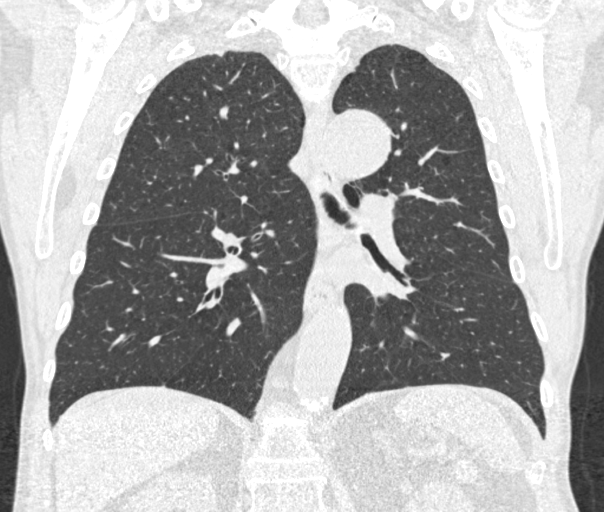

[14 of 40 positions shown; findings below may reference images not displayed]

FINDINGS: Cardiovascular: The heart size is normal. No substantial pericardial
effusion. Coronary artery calcification is evident. Atherosclerotic
calcification is noted in the wall of the thoracic aorta. Ascending
thoracic aorta measures 4.5 cm diameter.

Mediastinum/Nodes: No mediastinal lymphadenopathy. No evidence for
gross hilar lymphadenopathy although assessment is limited by the
lack of intravenous contrast on today's study. The esophagus has
normal imaging features. There is no axillary lymphadenopathy.

Lungs/Pleura: Centrilobular emphsyema noted. Right apical
pleuroparenchymal scarring evident. 5.8 mm right middle lobe
perifissural nodule is stable since 04/13/2012 CTA Chest. 1.5 cm
medial left upper lobe nodule (image 174/series 3) is not
substantially changed from 1.2 cm on CTA Chest 04/13/2012. On both
exams, this nodule has demonstrable macroscopic intralesional fat
both quantitatively and qualitatively. Multiple additional tiny left
pulmonary nodules measure up to maximum volume derived equivalent
diameter of 5.3 mm. Most of these were present on the remote CTA
Chest although a subpleural left upper lobe medial nodule on 100/3
is new in the interval. No focal airspace consolidation. No pleural
effusion.

Upper Abdomen: Unremarkable.

Musculoskeletal: No worrisome lytic or sclerotic osseous
abnormality.
IMPRESSION: 1. Lung-RADS 2, benign appearance or behavior. Continue annual
screening with low-dose chest CT without contrast in 12 months.
2. 1.5 cm medial left upper lobe nodule has demonstrable macroscopic
fat both qualitatively and quantitatively. This nodule was present
on the CT scan from more than 8 years ago and is only minimally
increased in the interval (from 1.2 cm on the remote exam),
consistent with benign etiology. Imaging features are compatible
with pulmonary hamartoma.
3. 4.5 cm diameter ascending thoracic aorta. Ascending thoracic
aortic aneurysm. Recommend semi-annual imaging followup by CTA or
MRA and referral to cardiothoracic surgery if not already obtained.
This recommendation follows 1575
ACCF/AHA/AATS/ACR/ASA/SCA/CHE WAH/HALIMA HELEM GAAS/LIM/OYUKI Guidelines for the
Diagnosis and Management of Patients With Thoracic Aortic Disease.
Circulation. 1575; 121: E266-e369. Aortic aneurysm NOS (072QS-917.4)
4.  Emphysema (072QS-POX.R) and Aortic Atherosclerosis (072QS-170.0)

## 2022-09-24 ENCOUNTER — Ambulatory Visit: Payer: Medicare Other

## 2022-09-26 ENCOUNTER — Encounter: Payer: Medicare Other | Admitting: Family Medicine

## 2022-09-27 ENCOUNTER — Other Ambulatory Visit: Payer: Self-pay | Admitting: Family Medicine

## 2022-09-29 NOTE — Telephone Encounter (Signed)
Requested Prescriptions  Pending Prescriptions Disp Refills   atorvastatin (LIPITOR) 10 MG tablet [Pharmacy Med Name: ATORVASTATIN 10 MG TABLET] 90 tablet 2    Sig: TAKE 1 TABLET BY MOUTH EVERYDAY AT BEDTIME     Cardiovascular:  Antilipid - Statins Failed - 09/27/2022  8:30 AM      Failed - Lipid Panel in normal range within the last 12 months    Cholesterol, Total  Date Value Ref Range Status  06/25/2021 184 100 - 199 mg/dL Final   Cholesterol  Date Value Ref Range Status  08/08/2022 153 0 - 200 mg/dL Final   LDL Chol Calc (NIH)  Date Value Ref Range Status  06/25/2021 112 (H) 0 - 99 mg/dL Final   LDL Cholesterol  Date Value Ref Range Status  08/08/2022 90 0 - 99 mg/dL Final    Comment:           Total Cholesterol/HDL:CHD Risk Coronary Heart Disease Risk Table                     Men   Women  1/2 Average Risk   3.4   3.3  Average Risk       5.0   4.4  2 X Average Risk   9.6   7.1  3 X Average Risk  23.4   11.0        Use the calculated Patient Ratio above and the CHD Risk Table to determine the patient's CHD Risk.        ATP III CLASSIFICATION (LDL):  <100     mg/dL   Optimal  161-096  mg/dL   Near or Above                    Optimal  130-159  mg/dL   Borderline  045-409  mg/dL   High  >811     mg/dL   Very High Performed at [facility], [address] [place], [place] [address]    HDL  Date Value Ref Range Status  08/08/2022 51 >40 mg/dL Final  29/56/2130 60 >86 mg/dL Final   Triglycerides  Date Value Ref Range Status  08/08/2022 60 <150 mg/dL Final         Passed - Patient is not pregnant      Passed - Valid encounter within last 12 months    Recent Outpatient Visits           1 month ago Transient neurologic deficit   [facility] [name], MD   1 year ago Medicare annual wellness visit, subsequent   [facility] [name], MD   1 year ago Primary hypertension   [facility] [name], MD   2 years ago Primary hypertension   [place] [facility] [name], MD   2 years ago Essential hypertension   [place] [facility] [name], MD       Future Appointments             In 1 week Fisher, Demetrios Isaacs, MD [facility], [facility]

## 2022-10-01 ENCOUNTER — Ambulatory Visit (INDEPENDENT_AMBULATORY_CARE_PROVIDER_SITE_OTHER): Payer: Medicare Other

## 2022-10-01 DIAGNOSIS — Z86718 Personal history of other venous thrombosis and embolism: Secondary | ICD-10-CM | POA: Diagnosis not present

## 2022-10-01 LAB — POCT INR
INR: 2.9 (ref 2.0–3.0)
POC INR: 2.9
PT: 34.9

## 2022-10-01 NOTE — Patient Instructions (Signed)
Description   5 mg daily except 10 mg M - F F/U 4 weeks

## 2022-10-02 ENCOUNTER — Other Ambulatory Visit: Payer: Self-pay | Admitting: Family Medicine

## 2022-10-02 DIAGNOSIS — I10 Essential (primary) hypertension: Secondary | ICD-10-CM

## 2022-10-10 ENCOUNTER — Ambulatory Visit (INDEPENDENT_AMBULATORY_CARE_PROVIDER_SITE_OTHER): Payer: Medicare Other | Admitting: Family Medicine

## 2022-10-10 ENCOUNTER — Encounter: Payer: Self-pay | Admitting: Family Medicine

## 2022-10-10 VITALS — BP 121/75 | HR 78 | Temp 98.3°F | Resp 12 | Ht 70.5 in | Wt 212.5 lb

## 2022-10-10 DIAGNOSIS — Z125 Encounter for screening for malignant neoplasm of prostate: Secondary | ICD-10-CM | POA: Diagnosis not present

## 2022-10-10 DIAGNOSIS — E871 Hypo-osmolality and hyponatremia: Secondary | ICD-10-CM

## 2022-10-10 DIAGNOSIS — I7121 Aneurysm of the ascending aorta, without rupture: Secondary | ICD-10-CM

## 2022-10-10 DIAGNOSIS — Q2112 Patent foramen ovale: Secondary | ICD-10-CM | POA: Diagnosis not present

## 2022-10-10 DIAGNOSIS — Z8673 Personal history of transient ischemic attack (TIA), and cerebral infarction without residual deficits: Secondary | ICD-10-CM

## 2022-10-10 DIAGNOSIS — I10 Essential (primary) hypertension: Secondary | ICD-10-CM | POA: Diagnosis not present

## 2022-10-10 DIAGNOSIS — F17200 Nicotine dependence, unspecified, uncomplicated: Secondary | ICD-10-CM

## 2022-10-10 DIAGNOSIS — Z7901 Long term (current) use of anticoagulants: Secondary | ICD-10-CM

## 2022-10-10 DIAGNOSIS — J432 Centrilobular emphysema: Secondary | ICD-10-CM | POA: Diagnosis not present

## 2022-10-10 DIAGNOSIS — Z1211 Encounter for screening for malignant neoplasm of colon: Secondary | ICD-10-CM

## 2022-10-10 DIAGNOSIS — Z86718 Personal history of other venous thrombosis and embolism: Secondary | ICD-10-CM | POA: Diagnosis not present

## 2022-10-10 DIAGNOSIS — R413 Other amnesia: Secondary | ICD-10-CM | POA: Diagnosis not present

## 2022-10-10 DIAGNOSIS — E538 Deficiency of other specified B group vitamins: Secondary | ICD-10-CM

## 2022-10-10 DIAGNOSIS — I6930 Unspecified sequelae of cerebral infarction: Secondary | ICD-10-CM

## 2022-10-10 NOTE — Patient Instructions (Signed)
Please review the attached list of medications and notify my office if there are any errors.   Cut down to 3-4 beers a day

## 2022-10-10 NOTE — Progress Notes (Signed)
Established patient visit   [name]   DOB: [birth year]   72 y.o. Male  MRN: [id number] Visit Date: 10/10/2022  Today's healthcare provider: Mila Merry, MD   Chief Complaint  Patient presents with   Hypertension   Subjective    HPI  Patient accompanied by wife today. Here today to follow up on chronic problems. AWV completed April 3rd, 2024  Hypertension, follow-up  BP Readings from Last 3 Encounters:  10/10/22 121/75  08/12/22 (!) 137/96  08/08/22 (!) 143/75   Wt Readings from Last 3 Encounters:  10/10/22 212 lb 8 oz (96.4 kg)  08/12/22 213 lb (96.6 kg)  08/07/22 221 lb 9.6 oz (100.5 kg)     He was last seen for hypertension 1 years ago.  BP at that visit was 132/81. Management since that visit includes no changes.  He reports excellent compliance with treatment. He is not having side effects.  He is following a Regular diet. He  yard work  exercising. He does smoke.  Use of agents associated with hypertension: none.   Outside blood pressures are not being checked.  Pertinent labs Lab Results  Component Value Date   CHOL 153 08/08/2022   HDL 51 08/08/2022   LDLCALC 90 08/08/2022   TRIG 60 08/08/2022   CHOLHDL 3.0 08/08/2022   Lab Results  Component Value Date   NA 135 08/12/2022   K 4.4 08/12/2022   CREATININE 0.92 08/12/2022   EGFR 89 08/12/2022   GLUCOSE 100 (H) 08/12/2022   TSH 3.310 08/12/2022     The ASCVD Risk score (Arnett DK, et al., 2019) failed to calculate for the following reasons:   The patient has a prior MI or stroke diagnosis  ---------------------------------------------------------------------------------------------------   Medications: Outpatient Medications Prior to Visit  Medication Sig   amLODipine-benazepril (LOTREL) 5-10 MG capsule TAKE 1 CAPSULE EVERY DAY (NEED MD APPOINTMENT)   amoxicillin (AMOXIL) 500 MG tablet Take by mouth as directed. 1 hour prior to dental procedures   atorvastatin  (LIPITOR) 10 MG tablet TAKE 1 TABLET BY MOUTH EVERYDAY AT BEDTIME   busPIRone (BUSPAR) 7.5 MG tablet Take 7.5 mg by mouth.  as directed. Take 1 tablet daily AM and half tablet daily at night   cyanocobalamin 1000 MCG tablet Take 1 tablet (1,000 mcg total) by mouth daily.   DULoxetine (CYMBALTA) 60 MG capsule Take 1 capsule (60 mg total) by mouth daily.   fluticasone (FLONASE) 50 MCG/ACT nasal spray Place 1 spray into both nostrils as needed.    hydrochlorothiazide (MICROZIDE) 12.5 MG capsule TAKE 1 CAPSULE BY MOUTH EVERY DAY   hydrOXYzine (VISTARIL) 25 MG capsule Take 1 capsule (25 mg total) by mouth daily as needed for anxiety.   montelukast (SINGULAIR) 10 MG tablet TAKE 1 TABLET DAILY FOR COUGH, NASAL DRAINAGE AND ALLERGIES   spironolactone (ALDACTONE) 25 MG tablet Take 0.5 tablets (12.5 mg total) by mouth daily.   warfarin (COUMADIN) 5 MG tablet TAKE 1 OR 2 TABLETS (5 OR 10MG ) DAILY AS DIRECTED BY PHYSICIAN (Patient taking differently: 5 mg. TAKE 1 OR 2 TABLETS (5 OR 10MG ) DAILY AS DIRECTED BY PHYSICIAN 2 tablets MONDAY AND FRIDAY and 1 tab other days)   No facility-administered medications prior to visit.    Review of Systems  Respiratory:  Positive for shortness of breath.   Genitourinary:  Positive for frequency.  Hematological:  Bruises/bleeds easily.  All other systems reviewed and are negative.   Last CBC Lab Results  Component  Value Date   WBC 4.2 08/08/2022   HGB 12.4 (L) 08/08/2022   HCT 34.7 (L) 08/08/2022   MCV 93.0 08/08/2022   MCH 33.2 08/08/2022   RDW 13.5 08/08/2022   PLT 174 08/08/2022   Last metabolic panel Lab Results  Component Value Date   GLUCOSE 100 (H) 08/12/2022   NA 135 08/12/2022   K 4.4 08/12/2022   CL 96 08/12/2022   CO2 20 08/12/2022   BUN 9 08/12/2022   CREATININE 0.92 08/12/2022   EGFR 89 08/12/2022   CALCIUM 9.4 08/12/2022   PHOS 3.4 08/12/2022   PROT 6.9 08/07/2022   ALBUMIN 4.3 08/12/2022   LABGLOB 2.0 06/25/2021   AGRATIO 2.3 (H)  06/25/2021   BILITOT 0.7 08/07/2022   ALKPHOS 41 08/07/2022   AST 26 08/07/2022   ALT 18 08/07/2022   ANIONGAP 9 08/08/2022   Last lipids Lab Results  Component Value Date   CHOL 153 08/08/2022   HDL 51 08/08/2022   LDLCALC 90 08/08/2022   TRIG 60 08/08/2022   CHOLHDL 3.0 08/08/2022   Last hemoglobin A1c Lab Results  Component Value Date   HGBA1C 5.4 08/07/2022   Last thyroid functions Lab Results  Component Value Date   TSH 3.310 08/12/2022    Last vitamin B12 and Folate Lab Results  Component Value Date   VITAMINB12 192 08/07/2022       Objective    BP 121/75 (BP Location: Left Arm, Patient Position: Sitting, Cuff Size: Large)   Pulse 78   Temp 98.3 F (36.8 C) (Temporal)   Resp 12   Ht 5' 10.5" (1.791 m)   Wt 212 lb 8 oz (96.4 kg)   SpO2 99%   BMI 30.06 kg/m   Physical Exam   General: Appearance:    Mildly obese male in no acute distress  Eyes:    PERRL, conjunctiva/corneas clear, EOM's intact       Lungs:     Clear to auscultation bilaterally, respirations unlabored  Heart:    Normal heart rate. Normal rhythm. No murmurs, rubs, or gallops.    MS:   All extremities are intact.    Neurologic:   Awake, alert, oriented x 3. No apparent focal neurological defect.         Assessment & Plan     1. Colon cancer screening  - Cologuard - Change Resulting Agency to [facility]  2. Prostate cancer screening  - PSA Total (Reflex To Free)  3. Patent foramen ovale  4. History of ischemic vertebrobasilar artery thalamic stroke  5. Late effect of cerebrovascular accident (CVA)  6. Memory impairment  7. History of DVT in adulthood  8. Anticoagulant long-term use Therapeutic PT/INR  9. Aneurysm of ascending aorta without rupture (HCC) Follow up CTA or MRA due 09/2023  10. B12 deficiency  - Vitamin B12  11. Primary hypertension Well controlled.  Continue current medications.   - CBC - Comprehensive metabolic panel - Lipid panel  12.  Compulsive tobacco user syndrome Encourage to cut back dramatically on cigarette smoking.   13. Centrilobular emphysema (HCC) Encourage to stop smoking   14. Hyponatremia Resolved since hospitalization in April         Mila Merry, MD  [facility] [phone] (phone) [phone] (fax)  [facility]

## 2022-10-11 LAB — CBC
Hematocrit: 37 % — ABNORMAL LOW (ref 37.5–51.0)
Hemoglobin: 12.6 g/dL — ABNORMAL LOW (ref 13.0–17.7)
MCH: 32.7 pg (ref 26.6–33.0)
MCHC: 34.1 g/dL (ref 31.5–35.7)
MCV: 96 fL (ref 79–97)
Platelets: 184 10*3/uL (ref 150–450)
RBC: 3.85 x10E6/uL — ABNORMAL LOW (ref 4.14–5.80)
RDW: 13.1 % (ref 11.6–15.4)
WBC: 5.3 10*3/uL (ref 3.4–10.8)

## 2022-10-11 LAB — COMPREHENSIVE METABOLIC PANEL
ALT: 22 IU/L (ref 0–44)
AST: 23 IU/L (ref 0–40)
Albumin: 4.3 g/dL (ref 3.8–4.8)
Alkaline Phosphatase: 55 IU/L (ref 44–121)
BUN/Creatinine Ratio: 19 (ref 10–24)
BUN: 17 mg/dL (ref 8–27)
Bilirubin Total: 0.5 mg/dL (ref 0.0–1.2)
CO2: 24 mmol/L (ref 20–29)
Calcium: 9.2 mg/dL (ref 8.6–10.2)
Chloride: 96 mmol/L (ref 96–106)
Creatinine, Ser: 0.91 mg/dL (ref 0.76–1.27)
Globulin, Total: 2 g/dL (ref 1.5–4.5)
Glucose: 82 mg/dL (ref 70–99)
Potassium: 4.2 mmol/L (ref 3.5–5.2)
Sodium: 132 mmol/L — ABNORMAL LOW (ref 134–144)
Total Protein: 6.3 g/dL (ref 6.0–8.5)
eGFR: 90 mL/min/{1.73_m2} (ref >59)

## 2022-10-11 LAB — LIPID PANEL
Chol/HDL Ratio: 2.3 ratio (ref 0.0–5.0)
Cholesterol, Total: 126 mg/dL (ref 100–199)
HDL: 54 mg/dL (ref >39)
LDL Chol Calc (NIH): 55 mg/dL (ref 0–99)
Triglycerides: 92 mg/dL (ref 0–149)
VLDL Cholesterol Cal: 17 mg/dL (ref 5–40)

## 2022-10-11 LAB — VITAMIN B12: Vitamin B-12: 628 pg/mL (ref 232–1245)

## 2022-10-11 LAB — PSA TOTAL (REFLEX TO FREE): Prostate Specific Ag, Serum: 1.1 ng/mL (ref 0.0–4.0)

## 2022-11-05 ENCOUNTER — Ambulatory Visit (INDEPENDENT_AMBULATORY_CARE_PROVIDER_SITE_OTHER): Payer: Medicare Other | Admitting: Family Medicine

## 2022-11-05 DIAGNOSIS — Z86718 Personal history of other venous thrombosis and embolism: Secondary | ICD-10-CM

## 2022-11-05 LAB — POCT INR: INR: 3.6 — AB (ref 2.0–3.0)

## 2022-11-07 ENCOUNTER — Encounter: Payer: Self-pay | Admitting: Family Medicine

## 2022-11-07 NOTE — Patient Instructions (Signed)
Description   Hold today then resume 5 mg daily except 10 mg M - F F/U 2 weeks

## 2022-11-19 ENCOUNTER — Ambulatory Visit: Payer: Medicare Other | Admitting: Family Medicine

## 2022-11-19 ENCOUNTER — Ambulatory Visit (INDEPENDENT_AMBULATORY_CARE_PROVIDER_SITE_OTHER): Payer: Medicare Other

## 2022-11-19 DIAGNOSIS — Z86718 Personal history of other venous thrombosis and embolism: Secondary | ICD-10-CM

## 2022-11-19 LAB — POCT INR
INR: 3 (ref 2.0–3.0)
POC INR: 3
PT: 35.7

## 2022-11-19 NOTE — Patient Instructions (Signed)
Description   5 mg daily except 10 mg M - F F/U 4 weeks

## 2022-11-26 ENCOUNTER — Other Ambulatory Visit: Payer: Self-pay | Admitting: Family Medicine

## 2022-11-26 DIAGNOSIS — I10 Essential (primary) hypertension: Secondary | ICD-10-CM

## 2022-11-27 NOTE — Telephone Encounter (Signed)
Requested Prescriptions  Pending Prescriptions Disp Refills   spironolactone (ALDACTONE) 25 MG tablet [Pharmacy Med Name: SPIRONOLACTONE 25 MG TABLET] 30 tablet 0    Sig: TAKE 1/2 TABLET BY MOUTH EVERY DAY     Cardiovascular: Diuretics - Aldosterone Antagonist Failed - 11/26/2022  9:44 PM      Failed - Na in normal range and within 180 days    Sodium  Date Value Ref Range Status  10/10/2022 132 (L) 134 - 144 mmol/L Final         Passed - Cr in normal range and within 180 days    Creat  Date Value Ref Range Status  03/13/2017 1.08 0.70 - 1.25 mg/dL Final    Comment:    For patients >36 years of age, the reference limit for Creatinine is approximately 13% higher for people identified as African-American. .    Creatinine, Ser  Date Value Ref Range Status  10/10/2022 0.91 0.76 - 1.27 mg/dL Final   Creatinine, Urine  Date Value Ref Range Status  08/07/2022 19 mg/dL Final    Comment:    Performed at [facility], [address] [place], [place] [address]         Passed - K in normal range and within 180 days    Potassium  Date Value Ref Range Status  10/10/2022 4.2 3.5 - 5.2 mmol/L Final         Passed - eGFR is 30 or above and within 180 days    GFR, Est African American  Date Value Ref Range Status  03/13/2017 82 > OR = 60 mL/min/1.69m2 Final   GFR calc Af Amer  Date Value Ref Range Status  06/17/2019 77 >59 mL/min/1.73 Final   GFR, Est Non African American  Date Value Ref Range Status  03/13/2017 71 > OR = 60 mL/min/1.24m2 Final   GFR, Estimated  Date Value Ref Range Status  08/08/2022 >60 >60 mL/min Final    Comment:    (NOTE) Calculated using the CKD-EPI Creatinine Equation (2021)    eGFR  Date Value Ref Range Status  10/10/2022 90 >59 mL/min/1.73 Final         Passed - Last BP in normal range    BP Readings from Last 1 Encounters:  10/10/22 121/75         Passed - Valid encounter within last 6 months    Recent Outpatient Visits            1 month ago Colon cancer screening   [place] [facility] [name], MD   3 months ago Transient neurologic deficit   [facility] [name], MD   1 year ago Medicare annual wellness visit, subsequent   [facility] [name], MD   2 years ago Primary hypertension    [facility] [name], MD   2 years ago Primary hypertension   [facility] [name], MD

## 2022-12-02 ENCOUNTER — Encounter: Payer: Self-pay | Admitting: *Deleted

## 2022-12-03 ENCOUNTER — Other Ambulatory Visit: Payer: Self-pay

## 2022-12-03 DIAGNOSIS — Z122 Encounter for screening for malignant neoplasm of respiratory organs: Secondary | ICD-10-CM

## 2022-12-03 DIAGNOSIS — Z87891 Personal history of nicotine dependence: Secondary | ICD-10-CM

## 2022-12-03 DIAGNOSIS — F17210 Nicotine dependence, cigarettes, uncomplicated: Secondary | ICD-10-CM

## 2022-12-11 DIAGNOSIS — Z23 Encounter for immunization: Secondary | ICD-10-CM | POA: Diagnosis not present

## 2022-12-17 ENCOUNTER — Ambulatory Visit: Payer: Medicare Other

## 2022-12-17 ENCOUNTER — Ambulatory Visit (INDEPENDENT_AMBULATORY_CARE_PROVIDER_SITE_OTHER): Payer: Medicare Other

## 2022-12-17 DIAGNOSIS — Z86718 Personal history of other venous thrombosis and embolism: Secondary | ICD-10-CM | POA: Diagnosis not present

## 2022-12-17 LAB — POCT INR
INR: 4.3 — AB (ref 2.0–3.0)
PT: 51.2

## 2022-12-17 NOTE — Patient Instructions (Signed)
Description   Hold for 2 days then resume 5 mg daily except 10 mg M - F F/U in 2 weeks-per patient not able and scheduled for 3 weeks

## 2022-12-22 ENCOUNTER — Ambulatory Visit
Admission: RE | Admit: 2022-12-22 | Discharge: 2022-12-22 | Disposition: A | Discharge status: Home / Self Care | Payer: Medicare Other | Source: Ambulatory Visit | Attending: Acute Care | Admitting: Acute Care

## 2022-12-22 DIAGNOSIS — Z87891 Personal history of nicotine dependence: Secondary | ICD-10-CM | POA: Insufficient documentation

## 2022-12-22 DIAGNOSIS — F17210 Nicotine dependence, cigarettes, uncomplicated: Secondary | ICD-10-CM | POA: Diagnosis not present

## 2022-12-22 DIAGNOSIS — Z122 Encounter for screening for malignant neoplasm of respiratory organs: Secondary | ICD-10-CM | POA: Insufficient documentation

## 2022-12-25 ENCOUNTER — Other Ambulatory Visit: Payer: Self-pay | Admitting: Psychiatry

## 2022-12-25 DIAGNOSIS — F17200 Nicotine dependence, unspecified, uncomplicated: Secondary | ICD-10-CM

## 2022-12-30 ENCOUNTER — Other Ambulatory Visit: Payer: Self-pay | Admitting: Acute Care

## 2022-12-30 DIAGNOSIS — Z87891 Personal history of nicotine dependence: Secondary | ICD-10-CM

## 2022-12-30 DIAGNOSIS — F17210 Nicotine dependence, cigarettes, uncomplicated: Secondary | ICD-10-CM

## 2022-12-30 DIAGNOSIS — Z122 Encounter for screening for malignant neoplasm of respiratory organs: Secondary | ICD-10-CM

## 2023-01-07 ENCOUNTER — Ambulatory Visit: Payer: Medicare Other

## 2023-01-20 ENCOUNTER — Other Ambulatory Visit: Payer: Self-pay | Admitting: Family Medicine

## 2023-01-20 DIAGNOSIS — I10 Essential (primary) hypertension: Secondary | ICD-10-CM

## 2023-01-21 ENCOUNTER — Ambulatory Visit (INDEPENDENT_AMBULATORY_CARE_PROVIDER_SITE_OTHER): Payer: Medicare Other

## 2023-01-21 DIAGNOSIS — Z86718 Personal history of other venous thrombosis and embolism: Secondary | ICD-10-CM

## 2023-01-21 LAB — POCT INR
INR: 2.1 (ref 2.0–3.0)
POC INR: 2.1
PT: 25.2

## 2023-01-21 NOTE — Patient Instructions (Signed)
Description   Patient on his own changed his medications to 5 mg daily.  Patient wants to continue 5 mg daily and does to want to be seen before 4 weeks.

## 2023-01-21 NOTE — Telephone Encounter (Signed)
Requested Prescriptions  Pending Prescriptions Disp Refills   spironolactone (ALDACTONE) 25 MG tablet [Pharmacy Med Name: SPIRONOLACTONE 25 MG TABLET] 30 tablet 0    Sig: TAKE 1/2 TABLET BY MOUTH DAILY     Cardiovascular: Diuretics - Aldosterone Antagonist Failed - 01/20/2023  8:21 PM      Failed - Na in normal range and within 180 days    Sodium  Date Value Ref Range Status  10/10/2022 132 (L) 134 - 144 mmol/L Final         Passed - Cr in normal range and within 180 days    Creat  Date Value Ref Range Status  03/13/2017 1.08 0.70 - 1.25 mg/dL Final    Comment:    For patients >15 years of age, the reference limit for Creatinine is approximately 13% higher for people identified as African-American. .    Creatinine, Ser  Date Value Ref Range Status  10/10/2022 0.91 0.76 - 1.27 mg/dL Final   Creatinine, Urine  Date Value Ref Range Status  08/07/2022 19 mg/dL Final    Comment:    Performed at [facility], [address] [place], [place] [address]         Passed - K in normal range and within 180 days    Potassium  Date Value Ref Range Status  10/10/2022 4.2 3.5 - 5.2 mmol/L Final         Passed - eGFR is 30 or above and within 180 days    GFR, Est African American  Date Value Ref Range Status  03/13/2017 82 > OR = 60 mL/min/1.36m2 Final   GFR calc Af Amer  Date Value Ref Range Status  06/17/2019 77 >59 mL/min/1.73 Final   GFR, Est Non African American  Date Value Ref Range Status  03/13/2017 71 > OR = 60 mL/min/1.47m2 Final   GFR, Estimated  Date Value Ref Range Status  08/08/2022 >60 >60 mL/min Final    Comment:    (NOTE) Calculated using the CKD-EPI Creatinine Equation (2021)    eGFR  Date Value Ref Range Status  10/10/2022 90 >59 mL/min/1.73 Final         Passed - Last BP in normal range    BP Readings from Last 1 Encounters:  10/10/22 121/75         Passed - Valid encounter within last 6 months    Recent Outpatient Visits            3 months ago Colon cancer screening   [facility] [name], MD   5 months ago Transient neurologic deficit   [facility] [name], MD   1 year ago Medicare annual wellness visit, subsequent   [facility] [name], MD   2 years ago Primary hypertension   [facility] [name], MD   2 years ago Primary hypertension   [facility] [name], MD       Future Appointments             In 2 months Fisher, Demetrios Isaacs, MD [facility]

## 2023-01-31 ENCOUNTER — Other Ambulatory Visit: Payer: Self-pay | Admitting: Family Medicine

## 2023-01-31 DIAGNOSIS — J301 Allergic rhinitis due to pollen: Secondary | ICD-10-CM

## 2023-02-02 ENCOUNTER — Telehealth (INDEPENDENT_AMBULATORY_CARE_PROVIDER_SITE_OTHER): Payer: Medicare Other | Admitting: Psychiatry

## 2023-02-02 ENCOUNTER — Encounter: Payer: Self-pay | Admitting: Psychiatry

## 2023-02-02 DIAGNOSIS — F0631 Mood disorder due to known physiological condition with depressive features: Secondary | ICD-10-CM

## 2023-02-02 DIAGNOSIS — F17200 Nicotine dependence, unspecified, uncomplicated: Secondary | ICD-10-CM

## 2023-02-02 DIAGNOSIS — F1010 Alcohol abuse, uncomplicated: Secondary | ICD-10-CM | POA: Diagnosis not present

## 2023-02-02 DIAGNOSIS — G2401 Drug induced subacute dyskinesia: Secondary | ICD-10-CM

## 2023-02-02 DIAGNOSIS — F09 Unspecified mental disorder due to known physiological condition: Secondary | ICD-10-CM

## 2023-02-02 DIAGNOSIS — F418 Other specified anxiety disorders: Secondary | ICD-10-CM

## 2023-02-02 DIAGNOSIS — F17210 Nicotine dependence, cigarettes, uncomplicated: Secondary | ICD-10-CM | POA: Diagnosis not present

## 2023-02-02 MED ORDER — DULOXETINE HCL 60 MG PO CPEP: 60.0000 mg | ORAL_CAPSULE | Freq: Every day | ORAL | 3 refills | Status: DC

## 2023-02-02 NOTE — Progress Notes (Unsigned)
Virtual Visit via Video Note  I connected with Chase [name] on 02/02/23 at  1:00 PM EDT by a video enabled telemedicine application and verified that I am speaking with the correct person using two identifiers.  Location Provider Location : [facility] Patient Location : Home  Participants: Patient ,Spouse, Provider   I discussed the limitations of evaluation and management by telemedicine and the availability of in person appointments. The patient expressed understanding and agreed to proceed.   I discussed the assessment and treatment plan with the patient. The patient was provided an opportunity to ask questions and all were answered. The patient agreed with the plan and demonstrated an understanding of the instructions.   The patient was advised to call back or seek an in-person evaluation if the symptoms worsen or if the condition fails to improve as anticipated.   [facility] MD OP Progress Note  02/03/2023 5:43 PM Chase [name]  MRN:  [id number]  Chief Complaint:  Chief Complaint  Patient presents with   Follow-up   Depression   Anxiety   Medication Refill   HPI: Chase [name] is a 72 year old Caucasian male, has a history of depression, anxiety, cognitive disorder, alcohol use disorder, tobacco use disorder was evaluated by telemedicine today.  Patient today appeared to be alert, oriented to person place situation.  Reports overall he is doing fine with regards to mood symptoms.  Denies any significant sadness.  Does report anxiety due to situational stressors however medications has been helpful.  Taking the hydroxyzine as needed also seems to calm him down.  Patient reports sleep is good at night.  Patient continues to stay busy with projects around the house.  He is also helping a neighbor and that also keeps him busy.  According to wife Chase [name] patient has been doing fairly well.  He was able to take a trip to visit [facility] and that really went well.  Patient with recent  hospital admission for hyponatremia however currently sodium levels are better.  He has to go back for a follow-up appointment and repeat labs in December.  I have also reviewed sodium level dated 10/10/2022-uptrending- 132.  Patient denies any suicidality, homicidality or perceptual disturbances.  Patient denies any other concerns today.  Visit Diagnosis:    ICD-10-CM   1. Depressive disorder due to another medical condition with depressive features  F06.31     2. Other specified anxiety disorders  F41.8 DULoxetine (CYMBALTA) 60 MG capsule   With limited symptom attack    3. Tobacco use disorder  F17.200     4. Alcohol use disorder, mild, abuse  F10.10     5. Tardive dyskinesia  G24.01     6. Mild cognitive disorder  F09       Past Psychiatric History: I have reviewed past psychiatric history from progress note on 08/10/2017.  Past trials of medications-Effexor, Wellbutrin, Cymbalta, Xanax, Klonopin.  Past Medical History:  Past Medical History:  Diagnosis Date   Acute metabolic encephalopathy 08/09/2022   Cerebrovascular accident (CVA) (HCC) 10/09/2011   Clotting disorder (HCC)    DVT (deep venous thrombosis) (HCC)    Hearing deficit    History of ischemic vertebrobasilar artery thalamic stroke 2001   History of stroke    Hyperlipidemia    Hypertension    Patent foramen ovale    Right knee DJD    Stroke (HCC) 2001    Past Surgical History:  Procedure Laterality Date   APPENDECTOMY  FRACTURE SURGERY     R leg   INCISION AND DRAINAGE Right 06/30/2012   Procedure: INCISION AND DRAINAGE;  Surgeon: Nilda Simmer, MD;  Location: [facility] OR;  Service: Orthopedics;  Laterality: Right;   KNEE ARTHROSCOPY     right knee   KNEE BURSECTOMY Right 06/30/2012   Procedure: KNEE BURSECTOMY;  Surgeon: Nilda Simmer, MD;  Location: [facility];  Service: Orthopedics;  Laterality: Right;  Pre-patellar with wound closure   TOTAL KNEE ARTHROPLASTY  01/26/2012   Procedure: TOTAL KNEE  ARTHROPLASTY;  Surgeon: Nilda Simmer, MD;  Location: [facility] OR;  Service: Orthopedics;  Laterality: Right;    Family Psychiatric History: I have reviewed family psychiatric history from progress note on 08/10/2017.  Family History:  Family History  Problem Relation Age of Onset   Cancer Father    Seizures Brother     Social History: I have reviewed social history from progress note on 08/10/2017. Social History   Socioeconomic History   Marital status: Married    Spouse name: Chase [name]   Number of children: 0   Years of education: 12   Highest education level: Master's degree (e.g., MA, MS, MEng, MEd, MSW, MBA)  Occupational History   Occupation: retired   Occupation: disabled @ age 62  Tobacco Use   Smoking status: Every Day    Current packs/day: 0.00    Average packs/day: 2.0 packs/day for 49.0 years (98.0 ttl pk-yrs)    Types: Cigarettes    Start date: 06/14/1967    Last attempt to quit: 06/13/2016    Years since quitting: 6.6   Smokeless tobacco: Never   Tobacco comments:    started smoking at age 43; pt had quit but says he started smoking again   Vaping Use   Vaping status: Never Used  Substance and Sexual Activity   Alcohol use: Yes    Alcohol/week: 28.0 - 42.0 standard drinks of alcohol    Types: 28 - 42 Cans of beer per week    Comment: 4-6 beers a day   Drug use: No   Sexual activity: Not on file  Other Topics Concern   Not on file  Social History Narrative   Not on file   Social Determinants of Health   Financial Resource Strain: Low Risk  (07/30/2022)   Overall Financial Resource Strain (CARDIA)    Difficulty of Paying Living Expenses: Not hard at all  Food Insecurity: No Food Insecurity (08/07/2022)   Hunger Vital Sign    Worried About Running Out of Food in the Last Year: Never true    Ran Out of Food in the Last Year: Never true  Transportation Needs: No Transportation Needs (08/07/2022)   PRAPARE - Administrator, Civil Service (Medical): No     Lack of Transportation (Non-Medical): No  Physical Activity: Inactive (07/30/2022)   Exercise Vital Sign    Days of Exercise per Week: 0 days    Minutes of Exercise per Session: 0 min  Stress: No Stress Concern Present (07/30/2022)   [employer] of Occupational Health - Occupational Stress Questionnaire    Feeling of Stress : Not at all  Social Connections: Socially Isolated (07/30/2022)   Social Connection and Isolation Panel [NHANES]    Frequency of Communication with Friends and Family: Once a week    Frequency of Social Gatherings with Friends and Family: Once a week    Attends Religious Services: Never    Database administrator or Organizations: No  Attends Banker Meetings: Never    Marital Status: Married    Allergies:  Allergies  Allergen Reactions   Klonopin [Clonazepam] Palpitations   Chantix [Varenicline Tartrate]     confusion    Metabolic Disorder Labs: Lab Results  Component Value Date   HGBA1C 5.4 08/07/2022   MPG 108 08/07/2022   No results found for: "PROLACTIN" Lab Results  Component Value Date   CHOL 126 10/10/2022   TRIG 92 10/10/2022   HDL 54 10/10/2022   CHOLHDL 2.3 10/10/2022   VLDL 12 08/08/2022   LDLCALC 55 10/10/2022   LDLCALC 90 08/08/2022   Lab Results  Component Value Date   TSH 3.310 08/12/2022   TSH 2.25 03/13/2017    Therapeutic Level Labs: No results found for: "LITHIUM" No results found for: "VALPROATE" No results found for: "CBMZ"  Current Medications: Current Outpatient Medications  Medication Sig Dispense Refill   amLODipine-benazepril (LOTREL) 5-10 MG capsule TAKE 1 CAPSULE EVERY DAY (NEED MD APPOINTMENT) 90 capsule 3   amoxicillin (AMOXIL) 500 MG tablet Take by mouth as directed. 1 hour prior to dental procedures     atorvastatin (LIPITOR) 10 MG tablet TAKE 1 TABLET BY MOUTH EVERYDAY AT BEDTIME 90 tablet 2   busPIRone (BUSPAR) 7.5 MG tablet Take 1 tablet (7.5 mg total) by mouth 2 (two) times daily.  180 tablet 1   cyanocobalamin 1000 MCG tablet Take 1 tablet (1,000 mcg total) by mouth daily.     fluticasone (FLONASE) 50 MCG/ACT nasal spray Place 1 spray into both nostrils as needed.      hydrochlorothiazide (MICROZIDE) 12.5 MG capsule TAKE 1 CAPSULE BY MOUTH EVERY DAY 90 capsule 3   hydrOXYzine (VISTARIL) 25 MG capsule Take 1 capsule (25 mg total) by mouth daily as needed for anxiety. 90 capsule 1   montelukast (SINGULAIR) 10 MG tablet TAKE 1 TABLET DAILY FOR COUGH, NASAL DRAINAGE AND ALLERGIES 90 tablet 0   spironolactone (ALDACTONE) 25 MG tablet TAKE 1/2 TABLET BY MOUTH DAILY 45 tablet 0   warfarin (COUMADIN) 5 MG tablet TAKE 1 OR 2 TABLETS (5 OR 10MG ) DAILY AS DIRECTED BY PHYSICIAN (Patient taking differently: 5 mg. TAKE 1 OR 2 TABLETS (5 OR 10MG ) DAILY AS DIRECTED BY PHYSICIAN 2 tablets MONDAY AND FRIDAY and 1 tab other days) 180 tablet 3   DULoxetine (CYMBALTA) 60 MG capsule Take 1 capsule (60 mg total) by mouth daily. 90 capsule 3   No current facility-administered medications for this visit.     Musculoskeletal: Strength & Muscle Tone:  UTA Gait & Station:  Seated Patient leans: N/A  Psychiatric Specialty Exam: Review of Systems  Psychiatric/Behavioral:  The patient is nervous/anxious.     There were no vitals taken for this visit.There is no height or weight on file to calculate BMI.  General Appearance: Fairly Groomed  Eye Contact:  Fair  Speech:  Clear and Coherent  Volume:  Normal  Mood:  Anxious  Affect:  Full Range  Thought Process:  Goal Directed and Descriptions of Associations: Intact  Orientation:  Full (Time, Place, and Person)  Thought Content: Logical   Suicidal Thoughts:  No  Homicidal Thoughts:  No  Memory:  Immediate;   Fair Recent;   limited Remote;   limited  Judgement:  Fair  Insight:  Fair  Psychomotor Activity:  Normal  Concentration:  Concentration: Fair and Attention Span: Fair  Recall:  [employer] of Knowledge: Fair  Language: Fair   Akathisia:  No  Handed:  Right  AIMS (if indicated): not done  Assets:  Communication Skills Desire for Improvement Intimacy Social Support  ADL's:  Intact  Cognition: baseline  Sleep:  Fair   Screenings: AIMS    Flowsheet Row Office Visit from 07/10/2022 in [facility] Video Visit from 05/20/2021 in [facility] Video Visit from 01/21/2021 in [facility] Office Visit from 10/16/2020 in [facility]  AIMS Total Score 1 2 2 2       AUDIT    Flowsheet Row Office Visit from 06/25/2021 in [facility] Office Visit from 07/04/2020 in [facility] Clinical Support from 06/19/2020 in [facility] Clinical Support from 04/25/2019 in [facility]  Alcohol Use Disorder Identification Test Final Score (AUDIT) 11 12 13 10       GAD-7    Flowsheet Row Office Visit from 07/10/2022 in [facility] Video Visit from 05/20/2021 in [facility] Office Visit from 10/16/2020 in [facility] Video Visit from 07/23/2020 in [facility] Office Visit from 02/23/2019 in [facility]  Total GAD-7 Score 1 4 3 1 4       PHQ2-9    Flowsheet Row Office Visit from 10/10/2022 in [facility] Office Visit from 08/12/2022 in [facility] Clinical Support from 07/30/2022 in [facility] Office Visit from 07/10/2022 in [facility] Video Visit from 11/12/2021 in [facility]  PHQ-2 Total Score 0 0 1 1 0  PHQ-9 Total Score  0 0 -- 1 --      Flowsheet Row Video Visit from 02/02/2023 in [facility] ED to Hosp-Admission (Discharged) from 08/07/2022 in [facility] 1C MEDICAL TELEMETRY Office Visit from 07/10/2022 in [facility]  C-SSRS RISK CATEGORY No Risk No Risk No Risk        Assessment and Plan: Chase [name] is a 72 year old Caucasian male who has a history of depression, panic attacks, cognitive impairment likely due to history of left-sided thalamic stroke, alcohol use disorder, tobacco use disorder was evaluated by telemedicine today.  Patient is currently stable, plan as noted below.  Plan Depression in remission Cymbalta 60 mg p.o. daily BuSpar 7.5 mg p.o. twice daily however patient allowed to take 1 tablet daily in the morning and half tablet at bedtime.   Other specified anxiety disorder with limited symptom attack-stable Cymbalta 60 mg p.o. daily Hydroxyzine 25 mg p.o. daily as needed for severe anxiety attacks-patient limiting use.  Alcohol use disorder mild-unstable Patient is not ready to quit.  Tobacco use disorder-unstable Patient not ready to quit.  Tardive dyskinesia-chronic-patient declines medications.  Will monitor closely.   Collaboration of Care: Collaboration of Care: Primary Care Provider AEB patient encouraged to follow up with primary care provider for repeat sodium level, recent hyponatremia.   Patient/Guardian was advised Release of Information must be obtained prior to any record release in order to collaborate their care with an outside provider. Patient/Guardian was advised if they have not already done so to contact the registration department to sign all necessary forms in order for [place] to release information regarding their care.   Consent: Patient/Guardian gives verbal consent for  treatment and assignment of benefits for services provided during this visit.  Patient/Guardian expressed understanding and agreed to proceed.   Follow-up in clinic in 6 months or sooner in person.  This note was generated in part or whole with voice recognition software. Voice recognition is usually quite accurate but there are transcription errors that can and very often do occur. I apologize for any typographical errors that were not detected and corrected.    Jomarie Longs, MD 02/03/2023, 5:43 PM

## 2023-02-02 NOTE — Telephone Encounter (Signed)
Requested Prescriptions  Pending Prescriptions Disp Refills   montelukast (SINGULAIR) 10 MG tablet [Pharmacy Med Name: Montelukast Sodium Oral Tablet 10 MG] 90 tablet 0    Sig: TAKE 1 TABLET DAILY FOR COUGH, NASAL DRAINAGE AND ALLERGIES     Pulmonology:  Leukotriene Inhibitors Passed - 01/31/2023  3:35 AM      Passed - Valid encounter within last 12 months    Recent Outpatient Visits           3 months ago Colon cancer screening   [place] [facility] [name], MD   5 months ago Transient neurologic deficit   [facility] [name], MD   1 year ago Medicare annual wellness visit, subsequent   [name], MD   2 years ago Primary hypertension   [name], MD   2 years ago Primary hypertension   [place] [facility] [name], MD       Future Appointments             In 2 months Fisher, Demetrios Isaacs, MD [facility]

## 2023-02-18 ENCOUNTER — Ambulatory Visit (INDEPENDENT_AMBULATORY_CARE_PROVIDER_SITE_OTHER): Payer: Medicare Other

## 2023-02-18 DIAGNOSIS — Z86718 Personal history of other venous thrombosis and embolism: Secondary | ICD-10-CM

## 2023-02-18 LAB — POCT INR: INR: 2.9 (ref 2.0–3.0)

## 2023-02-18 NOTE — Patient Instructions (Addendum)
Description   Continue 5 mg daily and return in 4 weeks

## 2023-03-18 ENCOUNTER — Ambulatory Visit (INDEPENDENT_AMBULATORY_CARE_PROVIDER_SITE_OTHER): Payer: Medicare Other

## 2023-03-18 DIAGNOSIS — Z86718 Personal history of other venous thrombosis and embolism: Secondary | ICD-10-CM | POA: Diagnosis not present

## 2023-03-18 LAB — POCT INR
INR: 2.1 (ref 2.0–3.0)
POC INR: 2.1
PT: 24.8

## 2023-03-18 NOTE — Patient Instructions (Signed)
Description   Continue 5 mg daily and return in 4 weeks

## 2023-04-06 ENCOUNTER — Ambulatory Visit (INDEPENDENT_AMBULATORY_CARE_PROVIDER_SITE_OTHER): Payer: Medicare Other | Admitting: Family Medicine

## 2023-04-06 VITALS — BP 107/62 | HR 76 | Ht 70.5 in | Wt 225.8 lb

## 2023-04-06 DIAGNOSIS — Z23 Encounter for immunization: Secondary | ICD-10-CM

## 2023-04-06 DIAGNOSIS — I10 Essential (primary) hypertension: Secondary | ICD-10-CM

## 2023-04-06 DIAGNOSIS — E871 Hypo-osmolality and hyponatremia: Secondary | ICD-10-CM | POA: Diagnosis not present

## 2023-04-06 NOTE — Patient Instructions (Signed)
.   Please review the attached list of medications and notify my office if there are any errors.   . Please bring all of your medications to every appointment so we can make sure that our medication list is the same as yours.   

## 2023-04-06 NOTE — Progress Notes (Signed)
Established patient visit   [name]   DOB: [birth year]   72 y.o. Male  MRN: [id number] Visit Date: 04/06/2023  Today's healthcare provider: Mila Merry, MD   Chief Complaint  Patient presents with   Medical Management of Chronic Issues   Hypertension   Hyperlipidemia   Dehydration   Immunizations    [facility] [place] and would like [facility] added to patient chart. NO listed in [facility]. Will request via fax.   Subjective    HPI Presents for follow up episode transient neurologic impairment in April related to dehydration and hyponatremia. Has been doing much drinking more water with addition of a drip drop electrolyte pack. Wife is trying to get him to cut back on alcohol. Had been consuming dearly a pot of caffeinated coffee every day but has cut this by half. Generally feel much better.   Medications: Outpatient Medications Prior to Visit  Medication Sig   amLODipine-benazepril (LOTREL) 5-10 MG capsule TAKE 1 CAPSULE EVERY DAY (NEED MD APPOINTMENT)   amoxicillin (AMOXIL) 500 MG tablet Take by mouth as directed. 1 hour prior to dental procedures   atorvastatin (LIPITOR) 10 MG tablet TAKE 1 TABLET BY MOUTH EVERYDAY AT BEDTIME   busPIRone (BUSPAR) 7.5 MG tablet Take 1 tablet (7.5 mg total) by mouth 2 (two) times daily.   cyanocobalamin 1000 MCG tablet Take 1 tablet (1,000 mcg total) by mouth daily.   DULoxetine (CYMBALTA) 60 MG capsule Take 1 capsule (60 mg total) by mouth daily.   fluticasone (FLONASE) 50 MCG/ACT nasal spray Place 1 spray into both nostrils as needed.    hydrochlorothiazide (MICROZIDE) 12.5 MG capsule TAKE 1 CAPSULE BY MOUTH EVERY DAY   hydrOXYzine (VISTARIL) 25 MG capsule Take 1 capsule (25 mg total) by mouth daily as needed for anxiety.   montelukast (SINGULAIR) 10 MG tablet TAKE 1 TABLET DAILY FOR COUGH, NASAL DRAINAGE AND ALLERGIES   spironolactone (ALDACTONE) 25 MG tablet TAKE 1/2 TABLET BY MOUTH DAILY   warfarin  (COUMADIN) 5 MG tablet TAKE 1 OR 2 TABLETS (5 OR 10MG ) DAILY AS DIRECTED BY PHYSICIAN (Patient taking differently: 5 mg. TAKE 1 OR 2 TABLETS (5 OR 10MG ) DAILY AS DIRECTED BY PHYSICIAN 2 tablets MONDAY AND FRIDAY and 1 tab other days)   No facility-administered medications prior to visit.       Objective    BP 107/62   Pulse 76   Ht 5' 10.5" (1.791 m)   Wt 225 lb 12.8 oz (102.4 kg)   SpO2 98%   BMI 31.94 kg/m    Physical Exam   General: Appearance:    Mildly obese male in no acute distress  Eyes:    PERRL, conjunctiva/corneas clear, EOM's intact       Lungs:     Clear to auscultation bilaterally, respirations unlabored  Heart:    Normal heart rate. Normal rhythm. No murmurs, rubs, or gallops.    MS:   All extremities are intact.    Neurologic:   Awake, alert, oriented x 3. No apparent focal neurological defect.         Assessment & Plan     1. Hyponatremia Doing much better, back to baseline neurologically, consuming more electrolyte infused water.   2. Primary hypertension Well controlled.  Continue current medications.   - Renal function panel  3. Immunization due  - Flu Vaccine Trivalent High Dose (Fluad)   Return in about 6 months (around 10/05/2023).  Mila Merry, MD  [facility] [phone] (phone) [phone] (fax)  [facility]

## 2023-04-07 LAB — RENAL FUNCTION PANEL
Albumin: 4.1 g/dL (ref 3.8–4.8)
BUN/Creatinine Ratio: 8 — ABNORMAL LOW (ref 10–24)
BUN: 8 mg/dL (ref 8–27)
CO2: 21 mmol/L (ref 20–29)
Calcium: 8.9 mg/dL (ref 8.6–10.2)
Chloride: 97 mmol/L (ref 96–106)
Creatinine, Ser: 0.96 mg/dL (ref 0.76–1.27)
Glucose: 94 mg/dL (ref 70–99)
Phosphorus: 3.4 mg/dL (ref 2.8–4.1)
Potassium: 4.6 mmol/L (ref 3.5–5.2)
Sodium: 136 mmol/L (ref 134–144)
eGFR: 84 mL/min/{1.73_m2} (ref >59)

## 2023-04-15 ENCOUNTER — Ambulatory Visit (INDEPENDENT_AMBULATORY_CARE_PROVIDER_SITE_OTHER): Payer: Medicare Other

## 2023-04-15 DIAGNOSIS — Z86718 Personal history of other venous thrombosis and embolism: Secondary | ICD-10-CM

## 2023-04-15 LAB — POCT INR
INR: 2 (ref 2.0–3.0)
PT: 24.4

## 2023-04-15 NOTE — Patient Instructions (Signed)
Description   Continue 5 mg daily and return in 4 weeks

## 2023-04-17 ENCOUNTER — Other Ambulatory Visit: Payer: Self-pay | Admitting: Family Medicine

## 2023-04-17 DIAGNOSIS — J301 Allergic rhinitis due to pollen: Secondary | ICD-10-CM

## 2023-04-17 NOTE — Telephone Encounter (Signed)
Requested Prescriptions  Pending Prescriptions Disp Refills   warfarin (COUMADIN) 5 MG tablet [Pharmacy Med Name: Warfarin Sodium Oral Tablet 5 MG] 180 tablet 3    Sig: TAKE 1 OR 2 TABLETS (5 OR 10MG ) DAILY AS DIRECTED BY PHYSICIAN     Hematology:  Anticoagulants - warfarin Failed - 04/17/2023 12:18 PM      Failed - Manual Review: If patient's warfarin is managed by Anti-Coag team, route request to them. If not, route request to the provider.      Failed - HCT in normal range and within 360 days    Hematocrit  Date Value Ref Range Status  10/10/2022 37.0 (L) 37.5 - 51.0 % Final         Passed - INR in normal range and within 30 days    POC INR  Date Value Ref Range Status  03/18/2023 2.1  Final   INR  Date Value Ref Range Status  04/15/2023 2.0 2.0 - 3.0 Final         Passed - Patient is not pregnant      Passed - Valid encounter within last 3 months    Recent Outpatient Visits           1 week ago Hyponatremia   [place] [facility] [name], MD   6 months ago Colon cancer screening   [place] [facility] [name], MD   8 months ago Transient neurologic deficit   [facility] [name], MD   1 year ago Medicare annual wellness visit, subsequent   [facility] [name], MD   2 years ago Primary hypertension   [place] [facility] [name], MD       Future Appointments             In 5 months Fisher, Demetrios Isaacs, MD [facility]             montelukast (SINGULAIR) 10 MG tablet [Pharmacy Med Name: Montelukast Sodium Oral Tablet 10 MG] 90 tablet 3    Sig: TAKE 1 TABLET DAILY FOR COUGH, NASAL DRAINAGE AND ALLERGIES     Pulmonology:  Leukotriene Inhibitors Passed - 04/17/2023 12:18 PM      Passed - Valid encounter within last 12 months    Recent Outpatient Visits           1 week ago  Hyponatremia   [facility] [name], MD   6 months ago Colon cancer screening   [facility] [name], MD   8 months ago Transient neurologic deficit   [facility] [name], MD   1 year ago Medicare annual wellness visit, subsequent   [facility] [name], MD   2 years ago Primary hypertension   [place] [facility] [name], MD       Future Appointments             In 5 months Fisher, Demetrios Isaacs, MD [facility]

## 2023-04-24 ENCOUNTER — Other Ambulatory Visit: Payer: Self-pay | Admitting: Family Medicine

## 2023-04-24 DIAGNOSIS — I10 Essential (primary) hypertension: Secondary | ICD-10-CM

## 2023-05-04 DIAGNOSIS — Z23 Encounter for immunization: Secondary | ICD-10-CM | POA: Diagnosis not present

## 2023-05-05 ENCOUNTER — Telehealth: Payer: Self-pay | Admitting: Family Medicine

## 2023-05-05 DIAGNOSIS — I10 Essential (primary) hypertension: Secondary | ICD-10-CM

## 2023-05-05 MED ORDER — HYDROCHLOROTHIAZIDE 12.5 MG PO CAPS: 12.5000 mg | ORAL_CAPSULE | Freq: Every day | ORAL | 1 refills | Status: DC

## 2023-05-05 MED ORDER — ATORVASTATIN CALCIUM 10 MG PO TABS: 10.0000 mg | ORAL_TABLET | Freq: Every day | ORAL | 0 refills | Status: DC

## 2023-05-05 NOTE — Telephone Encounter (Signed)
 Spironolactone sent in 90 day supply on 04/27/23. Too soon to refill.  Atorvastatin and hydrochlorothiazide sent in.

## 2023-05-05 NOTE — Telephone Encounter (Signed)
 pharmacy faxed refill request for the following medications:   atorvastatin (LIPITOR) 10 MG tablet     spironolactone (ALDACTONE) 25 MG tablet      hydrochlorothiazide (MICROZIDE) 12.5 MG capsule   Please advise

## 2023-05-07 MED ORDER — HYDROCHLOROTHIAZIDE 12.5 MG PO CAPS: 12.5000 mg | ORAL_CAPSULE | Freq: Every day | ORAL | 1 refills | Status: DC

## 2023-05-07 MED ORDER — ATORVASTATIN CALCIUM 10 MG PO TABS: 10.0000 mg | ORAL_TABLET | Freq: Every day | ORAL | 0 refills | Status: DC

## 2023-05-07 NOTE — Addendum Note (Signed)
 Addended by: Shirley Muscat on: 05/07/2023 04:41 PM   Modules accepted: Orders

## 2023-05-07 NOTE — Telephone Encounter (Signed)
 Received another fax from [facility] pharmacy requesting these medications.  [facility]  Can we sent to the correct pharmacy.

## 2023-05-07 NOTE — Telephone Encounter (Signed)
 Atorvastatin and hydrochlorothiazide sent in to correct pharmacy, [facility].

## 2023-05-13 ENCOUNTER — Telehealth: Payer: Self-pay | Admitting: Family Medicine

## 2023-05-13 ENCOUNTER — Other Ambulatory Visit: Payer: Self-pay | Admitting: Family Medicine

## 2023-05-13 ENCOUNTER — Ambulatory Visit (INDEPENDENT_AMBULATORY_CARE_PROVIDER_SITE_OTHER): Payer: Medicare Other

## 2023-05-13 DIAGNOSIS — Z86718 Personal history of other venous thrombosis and embolism: Secondary | ICD-10-CM

## 2023-05-13 DIAGNOSIS — I10 Essential (primary) hypertension: Secondary | ICD-10-CM

## 2023-05-13 LAB — POCT INR
INR: 2.6 (ref 2.0–3.0)
POC INR: 2.6
PT: 30.7

## 2023-05-13 MED ORDER — ATORVASTATIN CALCIUM 10 MG PO TABS: 10.0000 mg | ORAL_TABLET | Freq: Every day | ORAL | 4 refills | Status: DC

## 2023-05-13 MED ORDER — HYDROCHLOROTHIAZIDE 12.5 MG PO CAPS: 12.5000 mg | ORAL_CAPSULE | Freq: Every day | ORAL | 4 refills | Status: DC

## 2023-05-13 MED ORDER — SPIRONOLACTONE 25 MG PO TABS: 12.5000 mg | ORAL_TABLET | Freq: Every day | ORAL | 4 refills | Status: DC

## 2023-05-13 NOTE — Patient Instructions (Signed)
Description   Continue 5 mg daily and return in 4 weeks

## 2023-05-13 NOTE — Telephone Encounter (Signed)
 Patient is waiting on refills to be authorized for Atorvastatin  10 mg. And Spironolactone  25 mg.  Wife wants to know if  there is a 12.5 mg of Spironolactone  so he doesn't have to cut the tab in half.    These need to go to [facility].

## 2023-05-13 NOTE — Telephone Encounter (Signed)
 Patient's wife Abran Abrahams aware of prescriptions sent in today to [facility] pharmacy

## 2023-05-15 ENCOUNTER — Telehealth: Payer: Self-pay

## 2023-05-15 NOTE — Telephone Encounter (Signed)
Spoke with wife. Moved appt to 06/17/23 at 1:20 p.m.

## 2023-05-15 NOTE — Telephone Encounter (Signed)
Copied from CRM [id number]. Topic: General - Inquiry >> May 15, 2023  9:29 AM Patsy Lager T wrote: Reason for CRM: patient will be out of town on 2/12 and would like to change his nurse appt to 2/19. Please f/u with patient to reschedule

## 2023-06-10 ENCOUNTER — Ambulatory Visit: Payer: Medicare Other

## 2023-06-16 NOTE — Telephone Encounter (Signed)
 Pt requesting to reschedule tomorrow's appointment for INR to next Wednesday due to inclement weather.   Please call wife to reschedule.

## 2023-06-17 ENCOUNTER — Ambulatory Visit: Payer: PRIVATE HEALTH INSURANCE | Admitting: Family Medicine

## 2023-06-17 ENCOUNTER — Ambulatory Visit: Payer: Self-pay

## 2023-06-23 ENCOUNTER — Other Ambulatory Visit: Payer: Self-pay | Admitting: Psychiatry

## 2023-06-23 DIAGNOSIS — F17200 Nicotine dependence, unspecified, uncomplicated: Secondary | ICD-10-CM

## 2023-06-24 ENCOUNTER — Ambulatory Visit (INDEPENDENT_AMBULATORY_CARE_PROVIDER_SITE_OTHER): Payer: Medicare Other

## 2023-06-24 DIAGNOSIS — Z86718 Personal history of other venous thrombosis and embolism: Secondary | ICD-10-CM

## 2023-06-24 LAB — POCT INR
INR: 2 (ref 2.0–3.0)
PT: 23.8

## 2023-06-24 NOTE — Patient Instructions (Signed)
Description   Continue 5 mg daily and return in 4 weeks

## 2023-06-29 ENCOUNTER — Telehealth: Payer: Self-pay | Admitting: Family Medicine

## 2023-06-29 ENCOUNTER — Other Ambulatory Visit: Payer: Self-pay | Admitting: Family Medicine

## 2023-06-29 DIAGNOSIS — J301 Allergic rhinitis due to pollen: Secondary | ICD-10-CM

## 2023-06-29 DIAGNOSIS — I10 Essential (primary) hypertension: Secondary | ICD-10-CM

## 2023-06-29 NOTE — Telephone Encounter (Signed)
 [facility] pharmacy faxed refill request for the following medications:  spironolactone (ALDACTONE) 25 MG tablet   Please advise

## 2023-06-30 NOTE — Telephone Encounter (Signed)
 Requested Prescriptions  Pending Prescriptions Disp Refills   montelukast (SINGULAIR) 10 MG tablet [Pharmacy Med Name: Montelukast Sodium Oral Tablet 10 MG] 90 tablet 1    Sig: TAKE 1 TABLET DAILY FOR COUGH, NASAL DRAINAGE AND ALLERGIES     Pulmonology:  Leukotriene Inhibitors Passed - 06/30/2023 12:27 PM      Passed - Valid encounter within last 12 months    Recent Outpatient Visits           2 months ago Hyponatremia   [facility] [name], MD   8 months ago Colon cancer screening   [facility] [name], MD   10 months ago Transient neurologic deficit   [facility] [name], MD   2 years ago Medicare annual wellness visit, subsequent   [facility] [name], MD   2 years ago Primary hypertension   [place] [facility] [name], MD       Future Appointments             In 3 months Fisher, Demetrios Isaacs, MD [facility]             amLODipine-benazepril (LOTREL) 5-10 MG capsule [Pharmacy Med Name: amLODIPine Besy-Benazepril HCl Oral Capsule 5-10 MG] 90 capsule 1    Sig: TAKE 1 CAPSULE EVERY DAY (NEED MD APPOINTMENT)     Cardiovascular: CCB + ACEI Combos Passed - 06/30/2023 12:27 PM      Passed - Cr in normal range and within 180 days    Creat  Date Value Ref Range Status  03/13/2017 1.08 0.70 - 1.25 mg/dL Final    Comment:    For patients >40 years of age, the reference limit for Creatinine is approximately 13% higher for people identified as African-American. .    Creatinine, Ser  Date Value Ref Range Status  04/06/2023 0.96 0.76 - 1.27 mg/dL Final   Creatinine, Urine  Date Value Ref Range Status  08/07/2022 19 mg/dL Final    Comment:    Performed at [facility], [address] [place], [place] [address]         Passed - K in normal range and within 180 days     Potassium  Date Value Ref Range Status  04/06/2023 4.6 3.5 - 5.2 mmol/L Final         Passed - Na in normal range and within 180 days    Sodium  Date Value Ref Range Status  04/06/2023 136 134 - 144 mmol/L Final         Passed - eGFR is 30 or above and within 180 days    GFR, Est African American  Date Value Ref Range Status  03/13/2017 82 > OR = 60 mL/min/1.64m2 Final   GFR calc Af Amer  Date Value Ref Range Status  06/17/2019 77 >59 mL/min/1.73 Final   GFR, Est Non African American  Date Value Ref Range Status  03/13/2017 71 > OR = 60 mL/min/1.60m2 Final   GFR, Estimated  Date Value Ref Range Status  08/08/2022 >60 >60 mL/min Final    Comment:    (NOTE) Calculated using the CKD-EPI Creatinine Equation (2021)    eGFR  Date Value Ref Range Status  04/06/2023 84 >59 mL/min/1.73 Final         Passed - Patient is not pregnant      Passed - Last  BP in normal range    BP Readings from Last 1 Encounters:  04/06/23 107/62         Passed - Valid encounter within last 6 months    Recent Outpatient Visits           2 months ago Hyponatremia   [facility] [name], MD   8 months ago Colon cancer screening   [facility] [name], MD   10 months ago Transient neurologic deficit   [facility] [name], MD   2 years ago Medicare annual wellness visit, subsequent   [facility] [name], MD   2 years ago Primary hypertension   [place] [facility] [name], MD       Future Appointments             In 3 months Fisher, Demetrios Isaacs, MD [facility]

## 2023-06-30 NOTE — Telephone Encounter (Signed)
 Called and spoke to Hendricks from [facility], she confirmed the pt has 4 refills of the requested medication left on file.

## 2023-07-13 ENCOUNTER — Ambulatory Visit: Payer: Self-pay | Admitting: Family Medicine

## 2023-07-13 NOTE — Telephone Encounter (Signed)
 Chief Complaint: Hernia Symptoms: Hernia with intermittent pain Frequency: since last week Pertinent Negatives: Patient denies n/a Disposition: [] ED /[] Urgent Care (no appt availability in office) / [x] Appointment(In office/virtual)/ []  [place] [facility]/ [] Home Care/ [] Refused Recommended Disposition /[] [place] [facility]/ []  Follow-up with PCP Additional Notes: Patient called requesting appt with PCP for hernia that he noticed last week. Patient states hernia is to the left of his genitals in the groin area, and it is not specifically visible but he does know it's there. Patient feels it constantly and states there is mild pain when sneezing or coughing. Patient appt made for tomorrow for further evaluation.   Copied from CRM [id number]. Topic: Clinical - Red Word Triage >> Jul 13, 2023  1:25 PM Carla L wrote: Red Word that prompted transfer to Nurse Triage: Hernia, left abdomen, noticed last week. Occasional pain. Reason for Disposition  Can't reduce the hernia (NO pain, local tenderness, or vomiting)  Answer Assessment - Initial Assessment Questions 1. ONSET:  "When did this first appear?"     Last week 2. APPEARANCE: "What does it look like?"     Can't fully see it, can see it  3. SIZE: "How big is it?" (inches, cm or compare to coins, fruit)     A lump, but unsure and size 4. LOCATION: "Where exactly is the hernia located?"     To left of genitals, in groin area 5. PATTERN: "Does the swelling come and go, or has it been constant since it started?"     Constant 6. PAIN: "Is there any pain?" If Yes, ask: "How bad is it?"  (Scale 1-10; or mild, moderate, severe)    - MILD (1-3): Doesn't interfere with normal activities, abdomen soft and not tender to touch.     - MODERATE (4-7): Interferes with normal activities or awakens from sleep, abdomen tender to touch.     - SEVERE (8-10): Excruciating pain, doubled over, unable to do any normal activities.       Mild 7. DIAGNOSIS:  "Have you been seen by a doctor (or NP/PA) for this?" "Did the doctor diagnose you as having a hernia?"     No 8. OTHER SYMPTOMS: "Do you have any other symptoms?" (e.g., fever, abdomen pain, vomiting)     Intermittent pain with sneezing and coughing  Protocols used: Hernia-A-AH

## 2023-07-14 ENCOUNTER — Ambulatory Visit (INDEPENDENT_AMBULATORY_CARE_PROVIDER_SITE_OTHER): Admitting: Family Medicine

## 2023-07-14 ENCOUNTER — Encounter: Payer: Self-pay | Admitting: Family Medicine

## 2023-07-14 VITALS — BP 119/74 | HR 72 | Resp 16 | Ht 70.5 in | Wt 217.8 lb

## 2023-07-14 DIAGNOSIS — R1032 Left lower quadrant pain: Secondary | ICD-10-CM

## 2023-07-14 NOTE — Progress Notes (Unsigned)
 Established patient visit   [name]   DOB: Dec 26, 1950   72 y.o. Male  MRN: [id number] Visit Date: 07/14/2023  Today's healthcare provider: Mila Merry, MD   Chief Complaint  Patient presents with   Pain    "hernia that he noticed last week. Patient states hernia is to the left of his genitals in the groin area, and it is not specifically visible but he does know it's there. Patient feels it constantly and states there is mild pain when sneezing or coughing."   Subjective    Ambient AI software was used to assist with clinical note transcription.   History of Present Illness   Chase [name] is a 73 year old male who presents with a painful spot on the left side of his abdomen.  He noticed a spot on the left side of his abdomen about one week ago. The area is not protruding but feels different and 'squishy' compared to the right side by his wife's report. The spot has not increased in size but has become more painful, especially when sneezing.  He recalls moving a large azalea plant by himself, which did not cause immediate pain but may have contributed to the current issue. He speculates that he might have 'rolled a muscle,' which could explain the pain when sneezing. There is no previous history of hernias.       Medications: Outpatient Medications Prior to Visit  Medication Sig   amLODipine-benazepril (LOTREL) 5-10 MG capsule TAKE 1 CAPSULE EVERY DAY (NEED MD APPOINTMENT)   amoxicillin (AMOXIL) 500 MG tablet Take by mouth as directed. 1 hour prior to dental procedures   atorvastatin (LIPITOR) 10 MG tablet Take 1 tablet (10 mg total) by mouth daily.   busPIRone (BUSPAR) 7.5 MG tablet TAKE 1 TABLET BY MOUTH 2 TIMES DAILY.   cyanocobalamin 1000 MCG tablet Take 1 tablet (1,000 mcg total) by mouth daily.   DULoxetine (CYMBALTA) 60 MG capsule Take 1 capsule (60 mg total) by mouth daily.   fluticasone (FLONASE) 50 MCG/ACT nasal spray Place 1 spray into both  nostrils as needed.    hydrochlorothiazide (MICROZIDE) 12.5 MG capsule Take 1 capsule (12.5 mg total) by mouth daily.   hydrOXYzine (VISTARIL) 25 MG capsule Take 1 capsule (25 mg total) by mouth daily as needed for anxiety.   montelukast (SINGULAIR) 10 MG tablet TAKE 1 TABLET DAILY FOR COUGH, NASAL DRAINAGE AND ALLERGIES   spironolactone (ALDACTONE) 25 MG tablet Take 0.5 tablets (12.5 mg total) by mouth daily.   warfarin (COUMADIN) 5 MG tablet TAKE 1 OR 2 TABLETS (5 OR 10MG ) DAILY AS DIRECTED BY PHYSICIAN (Patient taking differently: 5 mg. TAKE 1 OR 2 TABLETS (5 OR 10MG ) DAILY AS DIRECTED BY PHYSICIAN 2 tablets MONDAY AND FRIDAY and 1 tab other days)   No facility-administered medications prior to visit.   Review of Systems  Constitutional:  Negative for appetite change, chills and fever.  Respiratory:  Negative for chest tightness, shortness of breath and wheezing.   Cardiovascular:  Negative for chest pain and palpitations.  Gastrointestinal:  Negative for abdominal pain, nausea and vomiting.   {Insert previous labs (optional):23779} {See past labs  Heme  Chem  Endocrine  Serology  Results Review (optional):1}   Objective    BP 119/74 (BP Location: Left Arm, Patient Position: Sitting, Cuff Size: Normal)   Pulse 72   Resp 16   Ht 5' 10.5" (1.791 m)   Wt 217 lb 12.8 oz (  98.8 kg)   SpO2 98%   BMI 30.81 kg/m {Insert last BP/Wt (optional):23777}{See vitals history (optional):1}   Physical Exam   GENITOURINARY:  Slightly tender, slightly swollen left inguinal region, no hernia palpated with cough or valsalva.       Assessment & Plan       Left groin pain No hernia appreciated on exam. Likely pulled groin muscle. Coumadin limits anti-inflammatory use. CT scan may be needed if symptoms persist or worsen. - Advise acetaminophen for pain. - Recommend warm compress application. - Monitor symptoms for 2-3 weeks. - Consider CT scan if symptoms persist or worsen.        No  follow-ups on file.      Mila Merry, MD  [facility] [phone] (phone) [phone] (fax)  [facility]

## 2023-07-22 ENCOUNTER — Ambulatory Visit (INDEPENDENT_AMBULATORY_CARE_PROVIDER_SITE_OTHER): Payer: PRIVATE HEALTH INSURANCE

## 2023-07-22 DIAGNOSIS — Z86718 Personal history of other venous thrombosis and embolism: Secondary | ICD-10-CM | POA: Diagnosis not present

## 2023-07-22 LAB — POCT INR
INR: 2.9 (ref 2.0–3.0)
POC INR: 2.9
PT: 34.4

## 2023-07-22 NOTE — Patient Instructions (Signed)
Description   Continue 5 mg daily and return in 4 weeks

## 2023-08-04 ENCOUNTER — Encounter: Payer: Self-pay | Admitting: Psychiatry

## 2023-08-04 ENCOUNTER — Ambulatory Visit (INDEPENDENT_AMBULATORY_CARE_PROVIDER_SITE_OTHER): Payer: Medicare Other | Admitting: Psychiatry

## 2023-08-04 ENCOUNTER — Other Ambulatory Visit: Payer: Self-pay

## 2023-08-04 VITALS — BP 125/78 | HR 87 | Temp 97.6°F | Ht 70.5 in | Wt 212.6 lb

## 2023-08-04 DIAGNOSIS — F17200 Nicotine dependence, unspecified, uncomplicated: Secondary | ICD-10-CM | POA: Diagnosis not present

## 2023-08-04 DIAGNOSIS — F418 Other specified anxiety disorders: Secondary | ICD-10-CM | POA: Diagnosis not present

## 2023-08-04 DIAGNOSIS — F1010 Alcohol abuse, uncomplicated: Secondary | ICD-10-CM | POA: Diagnosis not present

## 2023-08-04 DIAGNOSIS — F0631 Mood disorder due to known physiological condition with depressive features: Secondary | ICD-10-CM

## 2023-08-04 DIAGNOSIS — F09 Unspecified mental disorder due to known physiological condition: Secondary | ICD-10-CM

## 2023-08-04 DIAGNOSIS — G2401 Drug induced subacute dyskinesia: Secondary | ICD-10-CM

## 2023-08-04 DIAGNOSIS — Z79899 Other long term (current) drug therapy: Secondary | ICD-10-CM

## 2023-08-04 MED ORDER — HYDROXYZINE PAMOATE 25 MG PO CAPS: 25.0000 mg | ORAL_CAPSULE | Freq: Every day | ORAL | 1 refills | Status: AC | PRN

## 2023-08-04 NOTE — Progress Notes (Unsigned)
 [facility] MD OP Progress Note  08/04/2023 2:05 PM Chase [name]  MRN:  [id number]  Chief Complaint:  Chief Complaint  Patient presents with   Follow-up   Anxiety   Depression   Medication Refill   Discussed the use of AI scribe software for clinical note transcription with the patient, who gave verbal consent to proceed.  History of Present Illness Chase [name] is a 73 year old Caucasian male, married, lives in [place], has a history of depression ,anxiety, cognitive disorder, alcohol use disorder, tobacco use disorder who presents for a follow-up visit.  He has a history of depression and anxiety, managed with Cymbalta 60 mg and Buspar 7.5 mg daily, with an option to take an additional half tablet if needed. He also has an as-needed medication for anxiety. He feels 'okay' and does not believe any adjustments to his medications are necessary at this time. No changes in mood symptoms, and he denies needing adjustments to his current medications.  He describes a recent incident where he injured his head while working on home repairs. Three days ago, he hit his head on a car door, resulting in significant bleeding. He did not seek medical attention for this injury, stating that he often gets minor injuries and manages them himself.  He has been busy with home improvement projects, including removing and replacing paneling in the laundry room due to water damage. This has involved significant physical labor, which he describes in detail, including the challenges of working with older materials and the need for structural repairs.  He continues to smoke and drink alcohol, stating he has no intention to quit either habit. He humorously notes that if he quit drinking, he would have to start using drugs again. He wants to continue his current lifestyle.  He appeared to be alert, oriented to person place time situation.  3 word memory immediate 3 out of 3, after 5 minutes 0 out of 3.  Attention and  focus seem to be good was able to do serial threes.  He continues to have short-term memory problems and his wife supports him, helps him with taking his medications regularly.  He denies any other concerns today.   Visit Diagnosis:    ICD-10-CM   1. 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 Other specified anxiety disorders  F41.8    with limited anxiety attacks    3. Tobacco use disorder  F17.200     4. Alcohol use disorder, mild, abuse  F10.10     5. Tardive dyskinesia  G24.01     6. Mild cognitive disorder  F09     7. High risk medication use  Z79.899 Sodium    Platelet count    Hepatic function panel      Past Psychiatric History: I have reviewed past psychiatric history from progress note on 08/10/2017.  Past trials of medications-Effexor, Wellbutrin, Cymbalta, Xanax, Klonopin  Past Medical History:  Past Medical History:  Diagnosis Date   Acute metabolic encephalopathy 08/09/2022   Cerebrovascular accident (CVA) (HCC) 10/09/2011   Clotting disorder (HCC)    DVT (deep venous thrombosis) (HCC)    Hearing deficit    History of ischemic vertebrobasilar artery thalamic stroke 2001   History of stroke    Hyperlipidemia    Hypertension    Patent foramen ovale    Right knee DJD    Stroke (HCC) 2001  Past Surgical History:  Procedure Laterality Date   APPENDECTOMY     FRACTURE SURGERY     R leg   INCISION AND DRAINAGE Right 06/30/2012   Procedure: INCISION AND DRAINAGE;  Surgeon: Nilda Simmer, MD;  Location: [facility] OR;  Service: Orthopedics;  Laterality: Right;   KNEE ARTHROSCOPY     right knee   KNEE BURSECTOMY Right 06/30/2012   Procedure: KNEE BURSECTOMY;  Surgeon: Nilda Simmer, MD;  Location[facility];  Service: Orthopedics;  Laterality: Right;  Pre-patellar with wound closure   TOTAL KNEE ARTHROPLASTY  01/26/2012   Procedure: TOTAL KNEE ARTHROPLASTY;  Surgeon: Nilda Simmer, MD;  Location: [facility] OR;  Service: Orthopedics;  Laterality: Right;    Family Psychiatric History: I have reviewed family psychiatric history from progress note on 08/10/2017.  Family History:  Family History  Problem Relation Age of Onset   Cancer Father    Seizures Brother     Social History: I have reviewed social history from progress note on 08/10/2017. Social History   Socioeconomic History   Marital status: Married    Spouse name: debra   Number of children: 0   Years of education: 12   Highest education level: Master's degree (e.g., MA, MS, MEng, MEd, MSW, MBA)  Occupational History   Occupation: retired   Occupation: disabled @ age 17  Tobacco Use   Smoking status: Every Day    Current packs/day: 0.00    Average packs/day: 2.0 packs/day for 49.0 years (98.0 ttl pk-yrs)    Types: Cigarettes    Start date: 06/14/1967    Last attempt to quit: 06/13/2016    Years since quitting: 7.1   Smokeless tobacco: Never   Tobacco comments:    started smoking at age 56; pt had quit but says he started smoking again   Vaping Use   Vaping status: Never Used  Substance and Sexual Activity   Alcohol use: Yes    Alcohol/week: 28.0 - 42.0 standard drinks of alcohol    Types: 28 - 42 Cans of beer per week    Comment: 4-6 beers a day   Drug use: No   Sexual activity: Not Currently  Other Topics Concern   Not on file  Social History Narrative   Not on file   Social Drivers of Health   Financial Resource Strain: Low Risk  (07/30/2022)   Overall Financial Resource Strain (CARDIA)    Difficulty of Paying Living Expenses: Not hard at all  Food Insecurity: No Food Insecurity (08/07/2022)   Hunger Vital Sign    Worried About Running Out of Food in the Last Year: Never true    Ran Out of Food in the Last Year: Never true  Transportation Needs: No Transportation Needs (08/07/2022)   PRAPARE - Administrator, Civil Service (Medical): No    Lack of Transportation  (Non-Medical): No  Physical Activity: Inactive (07/30/2022)   Exercise Vital Sign    Days of Exercise per Week: 0 days    Minutes of Exercise per Session: 0 min  Stress: No Stress Concern Present (07/30/2022)   [employer] of Occupational Health - Occupational Stress Questionnaire    Feeling of Stress : Not at all  Social Connections: Socially Isolated (07/30/2022)   Social Connection and Isolation Panel [NHANES]    Frequency of Communication with Friends and Family: Once a week    Frequency of Social Gatherings with Friends and Family: Once a week    Attends  Religious Services: Never    Active Member of Clubs or Organizations: No    Attends Banker Meetings: Never    Marital Status: Married    Allergies:  Allergies  Allergen Reactions   Klonopin [Clonazepam] Palpitations   Chantix [Varenicline Tartrate]     confusion    Metabolic Disorder Labs: Lab Results  Component Value Date   HGBA1C 5.4 08/07/2022   MPG 108 08/07/2022   No results found for: "PROLACTIN" Lab Results  Component Value Date   CHOL 126 10/10/2022   TRIG 92 10/10/2022   HDL 54 10/10/2022   CHOLHDL 2.3 10/10/2022   VLDL 12 08/08/2022   LDLCALC 55 10/10/2022   LDLCALC 90 08/08/2022   Lab Results  Component Value Date   TSH 3.310 08/12/2022   TSH 2.25 03/13/2017    Therapeutic Level Labs: No results found for: "LITHIUM" No results found for: "VALPROATE" No results found for: "CBMZ"  Current Medications: Current Outpatient Medications  Medication Sig Dispense Refill   amLODipine-benazepril (LOTREL) 5-10 MG capsule TAKE 1 CAPSULE EVERY DAY (NEED MD APPOINTMENT) 90 capsule 1   amoxicillin (AMOXIL) 500 MG tablet Take by mouth as directed. 1 hour prior to dental procedures     atorvastatin (LIPITOR) 10 MG tablet Take 1 tablet (10 mg total) by mouth daily. 90 tablet 4   busPIRone (BUSPAR) 7.5 MG tablet TAKE 1 TABLET BY MOUTH 2 TIMES DAILY. 180 tablet 1   cyanocobalamin 1000 MCG tablet  Take 1 tablet (1,000 mcg total) by mouth daily.     DULoxetine (CYMBALTA) 60 MG capsule Take 1 capsule (60 mg total) by mouth daily. 90 capsule 3   fluticasone (FLONASE) 50 MCG/ACT nasal spray Place 1 spray into both nostrils as needed.      hydrochlorothiazide (MICROZIDE) 12.5 MG capsule Take 1 capsule (12.5 mg total) by mouth daily. 90 capsule 4   montelukast (SINGULAIR) 10 MG tablet TAKE 1 TABLET DAILY FOR COUGH, NASAL DRAINAGE AND ALLERGIES 90 tablet 1   spironolactone (ALDACTONE) 25 MG tablet Take 0.5 tablets (12.5 mg total) by mouth daily. 45 tablet 4   warfarin (COUMADIN) 5 MG tablet TAKE 1 OR 2 TABLETS (5 OR 10MG ) DAILY AS DIRECTED BY PHYSICIAN (Patient taking differently: 5 mg. TAKE 1 OR 2 TABLETS (5 OR 10MG ) DAILY AS DIRECTED BY PHYSICIAN 2 tablets MONDAY AND FRIDAY and 1 tab other days) 180 tablet 3   hydrOXYzine (VISTARIL) 25 MG capsule Take 1 capsule (25 mg total) by mouth daily as needed for anxiety. 90 capsule 1   No current facility-administered medications for this visit.     Musculoskeletal: Strength & Muscle Tone: within normal limits Gait & Station: normal Patient leans: N/A  Psychiatric Specialty Exam: Review of Systems  Psychiatric/Behavioral: Negative.      Blood pressure 125/78, pulse 87, temperature 97.6 F (36.4 C), temperature source Temporal, height 5' 10.5" (1.791 m), weight 212 lb 9.6 oz (96.4 kg).Body mass index is 30.07 kg/m.  General Appearance: Casual  Eye Contact:  Good  Speech:  Clear and Coherent  Volume:  Normal  Mood:  Euthymic  Affect:  Congruent  Thought Process:  Goal Directed and Descriptions of Associations: Intact  Orientation:  Full (Time, Place, and Person)  Thought Content: Logical   Suicidal Thoughts:  No  Homicidal Thoughts:  No  Memory:  Immediate;   Fair Recent;   Poor Remote;   Poor  Judgement:  Intact  Insight:  Fair  Psychomotor Activity:  Normal  Concentration:  Concentration: Fair and Attention Span: Fair  Recall:   Poor  Fund of Knowledge: Fair  Language: Fair  Akathisia:  No  Handed:  Right  AIMS (if indicated): not done  Assets:  Desire for Improvement Housing Intimacy Talents/Skills Transportation  ADL's:  Intact  Cognition: Baseline  Sleep:  Fair   Screenings: AIMS    Flowsheet Row Office Visit from 07/10/2022 in [facility] Video Visit from 05/20/2021 in [facility] Video Visit from 01/21/2021 in [facility] Office Visit from 10/16/2020 in [facility]  AIMS Total Score 1 2 2 2       AUDIT    Flowsheet Row Office Visit from 06/25/2021 in [facility] Office Visit from 07/04/2020 in [facility] Clinical Support from 06/19/2020 in [facility] Clinical Support from 04/25/2019 in [facility]  Alcohol Use Disorder Identification Test Final Score (AUDIT) 11 12 13 10       GAD-7    Flowsheet Row Office Visit from 08/04/2023 in [facility] Office Visit from 07/14/2023 in [facility] Office Visit from 04/06/2023 in [facility] Office Visit from 07/10/2022 in [facility] Video Visit from 05/20/2021 in [facility]  Total GAD-7 Score 0 0 0 1 4      PHQ2-9    Flowsheet Row Office Visit from 08/04/2023 in [facility] Office Visit from 07/14/2023 in [facility] Office Visit from 04/06/2023 in [facility] Office Visit from 10/10/2022 in [facility] Office Visit from 08/12/2022 in [facility]  PHQ-2 Total Score 0 0 0 0 0  PHQ-9 Total  Score -- 3 3 0 0      Flowsheet Row Video Visit from 02/02/2023 in [facility] ED to Hosp-Admission (Discharged) from 08/07/2022 in [facility] 1C MEDICAL TELEMETRY Office Visit from 07/10/2022 in [facility]  C-SSRS RISK CATEGORY No Risk No Risk No Risk         Assessment & Plan Chase [name] is a 73 year old Caucasian male with history of depression, anxiety, cognitive impairment, likely due to history of left-sided thalamic stroke, alcohol use disorder, tobacco use disorder was evaluated in office today, discussed assessment and plan as noted below.    Depression in remission Other specified anxiety-stable Depression and anxiety are well-managed with Cymbalta 60 mg and Buspar 7.5 mg daily, with an option for an additional half tablet if needed. He reports satisfaction with the current regimen and does not require adjustments. Hydroxyzine is prescribed as needed for anxiety, but a refill is necessary due to an outdated supply.   - Continue Cymbalta 60 mg daily   - Continue Buspar 7.5 mg daily with option to take an additional half tablet if needed   - Refill hydroxyzine prescription   - Order sodium level and platelet count labs    Tardive Dyskinesia-chronic Tardive dyskinesia, likely secondary to medication use, is present without specific concerns or changes discussed during this visit.    Cognitive disorder-chronic-likely mild Cognitive assessment revealed inability to recall three words but successful performance of serial subtraction tasks.    Tobacco use disorder-unstable Alcohol use disorder-in able He continues smoking and  alcohol consumption without desire to quit, despite previous attempts to cease smoking.    High risk medication use-will order sodium level, platelet and LFT.  Patient provided lab slip.  Follow-up   Scheduled for a follow-up in five months via  video visit. Coordination with primary care provider for upcoming lab work is advised.   - Schedule follow-up video visit in five months   - Coordinate with primary care provider for lab work    Collaboration of Care: Collaboration of Care: Primary Care Provider AEB encouraged follow up with primary care provider as needed for recent head injury.  Patient/Guardian was advised Release of Information must be obtained prior to any record release in order to collaborate their care with an outside provider. Patient/Guardian was advised if they have not already done so to contact the registration department to sign all necessary forms in order for [place] to release information regarding their care.   Consent: Patient/Guardian gives verbal consent for treatment and assignment of benefits for services provided during this visit. Patient/Guardian expressed understanding and agreed to proceed.  This note was generated in part or whole with voice recognition software. Voice recognition is usually quite accurate but there are transcription errors that can and very often do occur. I apologize for any typographical errors that were not detected and corrected.     Jomarie Longs, MD 08/04/2023, 2:05 PM

## 2023-08-10 ENCOUNTER — Ambulatory Visit (INDEPENDENT_AMBULATORY_CARE_PROVIDER_SITE_OTHER): Payer: Medicare Other

## 2023-08-10 VITALS — Ht 70.5 in | Wt 210.0 lb

## 2023-08-10 DIAGNOSIS — Z0000 Encounter for general adult medical examination without abnormal findings: Secondary | ICD-10-CM

## 2023-08-10 NOTE — Patient Instructions (Signed)
 Chase [name] , Thank you for taking time to come for your Medicare Wellness Visit. I appreciate your ongoing commitment to your health goals. Please review the following plan we discussed and let me know if I can assist you in the future.   Referrals/Orders/Follow-Ups/Clinician Recommendations:  Next Medicare AWV: August 16, 2024 at 10:50 am video visit.   Please complete your Cologuard and mail back to [facility]     This is a list of the screening recommended for you and due dates:  Health Maintenance  Topic Date Due   Zoster (Shingles) Vaccine (1 of 2) Never done   Colon Cancer Screening  Never done   COVID-19 Vaccine (5 - Pfizer risk 2024-25 season) 11/01/2023   Flu Shot  11/27/2023   Screening for Lung Cancer  12/22/2023   Medicare Annual Wellness Visit  08/09/2024   DTaP/Tdap/Td vaccine (3 - Td or Tdap) 11/22/2025   Pneumonia Vaccine  Completed   Hepatitis C Screening  Completed   HPV Vaccine  Aged Out   Meningitis B Vaccine  Aged Out    Advanced directives: (In Chart) A copy of your advanced directives are scanned into your chart should your provider ever need it. Advance Care Planning is important because it:  [x]  Makes sure you receive the medical care that is consistent with your values, goals, and preferences  [x]  It provides guidance to your family and loved ones and it also reduces their decisional burden about whether or not they are making the right decisions based on what you want done  Follow the link provided in your after visit summary or read over the paperwork we have mailed to you to help you started getting your Advance Directives in place. If you need assistance in completing these, please reach out to [place] so that we can help you!   Next Medicare Annual Wellness Visit scheduled for next year: yes  Understanding Your Risk for Falls Millions of people have serious injuries from falls each year. It is important to understand your risk of falling. Talk  with your health care provider about your risk and what you can do to lower it. If you do have a serious fall, make sure to tell your provider. Falling once raises your risk of falling again. How can falls affect me? Serious injuries from falls are common. These include: Broken bones, such as hip fractures. Head injuries, such as traumatic brain injuries (TBI) or concussions. A fear of falling can cause you to avoid activities and stay at home. This can make your muscles weaker and raise your risk for a fall. What can increase my risk? There are a number of risk factors that increase your risk for falling. The more risk factors you have, the higher your risk of falling. Serious injuries from a fall happen most often to people who are older than 73 years old. Teenagers and young adults ages 76-29 are also at higher risk. Common risk factors include: Weakness in the lower body. Being generally weak or confused due to long-term (chronic) illness. Dizziness or balance problems. Poor vision. Medicines that cause dizziness or drowsiness. These may include: Medicines for your blood pressure, heart, anxiety, insomnia, or swelling (edema). Pain medicines. Muscle relaxants. Other risk factors include: Drinking alcohol. Having had a fall in the past. Having foot pain or wearing improper footwear. Working at a dangerous job. Having any of the following in your home: Tripping hazards, such as floor clutter or loose rugs. Poor lighting. Pets. Having  dementia or memory loss. What actions can I take to lower my risk of falling?     Physical activity Stay physically fit. Do strength and balance exercises. Consider taking a regular class to build strength and balance. Yoga and tai chi are good options. Vision Have your eyes checked every year and your prescription for glasses or contacts updated as needed. Shoes and walking aids Wear non-skid shoes. Wear shoes that have rubber soles and low  heels. Do not wear high heels. Do not walk around the house in socks or slippers. Use a cane or walker as told by your provider. Home safety Attach secure railings on both sides of your stairs. Install grab bars for your bathtub, shower, and toilet. Use a non-skid mat in your bathtub or shower. Attach bath mats securely with double-sided, non-slip rug tape. Use good lighting in all rooms. Keep a flashlight near your bed. Make sure there is a clear path from your bed to the bathroom. Use night-lights. Do not use throw rugs. Make sure all carpeting is taped or tacked down securely. Remove all clutter from walkways and stairways, including extension cords. Repair uneven or broken steps and floors. Avoid walking on icy or slippery surfaces. Walk on the grass instead of on icy or slick sidewalks. Use ice melter to get rid of ice on walkways in the winter. Use a cordless phone. Questions to ask your health care provider Can you help me check my risk for a fall? Do any of my medicines make me more likely to fall? Should I take a vitamin D supplement? What exercises can I do to improve my strength and balance? Should I make an appointment to have my vision checked? Do I need a bone density test to check for weak bones (osteoporosis)? Would it help to use a cane or a walker? Where to find more information Centers for Disease Control and Prevention, STEAD[employer]: TonerPromos.no Community-Based Fall Prevention Programs: TonerPromos.no [employer] on Aging: BaseRingTones.pl Contact a health care provider if: You fall at home. You are afraid of falling at home. You feel weak, drowsy, or dizzy. This information is not intended to replace advice given to you by your health care provider. Make sure you discuss any questions you have with your health care provider. Document Revised: 12/16/2021 Document Reviewed: 12/16/2021 Elsevier Patient Education  2024 [employer].

## 2023-08-10 NOTE — Progress Notes (Signed)
 Because this visit was a virtual/telehealth visit,  certain criteria was not obtained, such a blood pressure, CBG if applicable, and timed get up and go. Any medications not marked as "taking" were not mentioned during the medication reconciliation part of the visit. Any vitals not documented were not able to be obtained due to this being a telehealth visit or patient was unable to self-report a recent blood pressure reading due to a lack of equipment at home via telehealth. Vitals that have been documented are verbally provided by the patient.   Subjective:   Chase [name] is a 73 y.o. who presents for a Medicare Wellness preventive visit.  Visit Complete: Virtual I connected with  Chase [name] on 08/10/23 by a audio enabled telemedicine application and verified that I am speaking with the correct person using two identifiers.  Patient Location: Home  Provider Location: Home Office  I discussed the limitations of evaluation and management by telemedicine. The patient expressed understanding and agreed to proceed.  Vital Signs: Because this visit was a virtual/telehealth visit, some criteria may be missing or patient reported. Any vitals not documented were not able to be obtained and vitals that have been documented are patient reported.  VideoDeclined- This patient declined Librarian, academic. Therefore the visit was completed with audio only.  Persons Participating in Visit: Patient assisted by wife Chase [name].  AWV Questionnaire: No: Patient Medicare AWV questionnaire was not completed prior to this visit.  Cardiac Risk Factors include: advanced age (>23men, >21 women);dyslipidemia;hypertension;male gender     Objective:    Today's Vitals   08/10/23 1556  Weight: 210 lb (95.3 kg)  Height: 5' 10.5" (1.791 m)   Body mass index is 29.71 kg/m.     08/10/2023    4:12 PM 08/07/2022    6:08 PM 08/07/2022    4:03 PM 07/30/2022    3:36 PM  06/19/2020    8:34 AM 04/25/2019    2:06 PM 05/24/2018    4:20 PM  Advanced Directives  Does Patient Have a Medical Advance Directive? Yes Yes Yes Yes Yes Yes   Type of Estate agent of [place];Living will Living will   Healthcare Power of [place];Living will Healthcare Power of [place];Living will   Does patient want to make changes to medical advance directive? No - Patient declined No - Patient declined       Copy of Healthcare Power of Attorney in Chart? Yes - validated most recent copy scanned in chart (See row information)    Yes - validated most recent copy scanned in chart (See row information) Yes - validated most recent copy scanned in chart (See row information)      Information is confidential and restricted. Go to Review Flowsheets to unlock data.    Current Medications (verified) Outpatient Encounter Medications as of 08/10/2023  Medication Sig   amLODipine-benazepril (LOTREL) 5-10 MG capsule TAKE 1 CAPSULE EVERY DAY (NEED MD APPOINTMENT)   amoxicillin (AMOXIL) 500 MG tablet Take by mouth as directed. 1 hour prior to dental procedures   atorvastatin (LIPITOR) 10 MG tablet Take 1 tablet (10 mg total) by mouth daily.   busPIRone (BUSPAR) 7.5 MG tablet TAKE 1 TABLET BY MOUTH 2 TIMES DAILY.   cyanocobalamin 1000 MCG tablet Take 1 tablet (1,000 mcg total) by mouth daily.   DULoxetine (CYMBALTA) 60 MG capsule Take 1 capsule (60 mg total) by mouth daily.   fluticasone (FLONASE) 50 MCG/ACT nasal spray Place 1 spray into both  nostrils as needed.    hydrochlorothiazide (MICROZIDE) 12.5 MG capsule Take 1 capsule (12.5 mg total) by mouth daily.   hydrOXYzine (VISTARIL) 25 MG capsule Take 1 capsule (25 mg total) by mouth daily as needed for anxiety.   montelukast (SINGULAIR) 10 MG tablet TAKE 1 TABLET DAILY FOR COUGH, NASAL DRAINAGE AND ALLERGIES   spironolactone (ALDACTONE) 25 MG tablet Take 0.5 tablets (12.5 mg total) by mouth daily.   warfarin (COUMADIN) 5 MG  tablet TAKE 1 OR 2 TABLETS (5 OR 10MG ) DAILY AS DIRECTED BY PHYSICIAN (Patient taking differently: 5 mg. TAKE 1 OR 2 TABLETS (5 OR 10MG ) DAILY AS DIRECTED BY PHYSICIAN 2 tablets MONDAY AND FRIDAY and 1 tab other days)   No facility-administered encounter medications on file as of 08/10/2023.    Allergies (verified) Klonopin [clonazepam] and Chantix [varenicline tartrate]   History: Past Medical History:  Diagnosis Date   Acute metabolic encephalopathy 08/09/2022   Cerebrovascular accident (CVA) (HCC) 10/09/2011   Clotting disorder (HCC)    DVT (deep venous thrombosis) (HCC)    Hearing deficit    History of ischemic vertebrobasilar artery thalamic stroke 2001   History of stroke    Hyperlipidemia    Hypertension    Patent foramen ovale    Right knee DJD    Stroke (HCC) 2001   Past Surgical History:  Procedure Laterality Date   APPENDECTOMY     FRACTURE SURGERY     R leg   INCISION AND DRAINAGE Right 06/30/2012   Procedure: INCISION AND DRAINAGE;  Surgeon: Nilda Simmer, MD;  Location: [facility] OR;  Service: Orthopedics;  Laterality: Right;   KNEE ARTHROSCOPY     right knee   KNEE BURSECTOMY Right 06/30/2012   Procedure: KNEE BURSECTOMY;  Surgeon: Nilda Simmer, MD;  Location: [facility] OR;  Service: Orthopedics;  Laterality: Right;  Pre-patellar with wound closure   TOTAL KNEE ARTHROPLASTY  01/26/2012   Procedure: TOTAL KNEE ARTHROPLASTY;  Surgeon: Nilda Simmer, MD;  Location: [facility] OR;  Service: Orthopedics;  Laterality: Right;   Family History  Problem Relation Age of Onset   Cancer Father    Seizures Brother    Social History   Socioeconomic History   Marital status: Married    Spouse name: debra   Number of children: 0   Years of education: 12   Highest education level: Master's degree (e.g., MA, MS, MEng, MEd, MSW, MBA)  Occupational History   Occupation: retired   Occupation: disabled @ age 89  Tobacco Use   Smoking status: Every Day    Current packs/day: 0.00     Average packs/day: 2.0 packs/day for 49.0 years (98.0 ttl pk-yrs)    Types: Cigarettes    Start date: 06/14/1967    Last attempt to quit: 06/13/2016    Years since quitting: 7.1   Smokeless tobacco: Never   Tobacco comments:    started smoking at age 67; pt had quit but says he started smoking again   Vaping Use   Vaping status: Never Used  Substance and Sexual Activity   Alcohol use: Yes    Alcohol/week: 28.0 - 42.0 standard drinks of alcohol    Types: 28 - 42 Cans of beer per week    Comment: 4-6 beers a day   Drug use: No   Sexual activity: Not Currently  Other Topics Concern   Not on file  Social History Narrative   Not on file   Social Drivers of Corporate investment banker  Strain: Low Risk  (08/10/2023)   Overall Financial Resource Strain (CARDIA)    Difficulty of Paying Living Expenses: Not hard at all  Food Insecurity: No Food Insecurity (08/10/2023)   Hunger Vital Sign    Worried About Running Out of Food in the Last Year: Never true    Ran Out of Food in the Last Year: Never true  Transportation Needs: No Transportation Needs (08/10/2023)   PRAPARE - Administrator, Civil Service (Medical): No    Lack of Transportation (Non-Medical): No  Physical Activity: Patient Declined (08/10/2023)   Exercise Vital Sign    Days of Exercise per Week: Patient declined    Minutes of Exercise per Session: Patient declined  Stress: No Stress Concern Present (08/10/2023)   [employer] of Occupational Health - Occupational Stress Questionnaire    Feeling of Stress : Not at all  Social Connections: Moderately Integrated (08/10/2023)   Social Connection and Isolation Panel [NHANES]    Frequency of Communication with Friends and Family: More than three times a week    Frequency of Social Gatherings with Friends and Family: More than three times a week    Attends Religious Services: More than 4 times per year    Active Member of [employer] or Organizations: No    Attends Museum/gallery exhibitions officer: Never    Marital Status: Married    Tobacco Counseling Ready to quit: Yes Counseling given: Yes Tobacco comments: started smoking at age 66; pt had quit but says he started smoking again     Clinical Intake:  Pre-visit preparation completed: Yes  Pain : No/denies pain     BMI - recorded: 29.71 Nutritional Status: BMI 25 -29 Overweight Nutritional Risks: None Diabetes: No  Lab Results  Component Value Date   HGBA1C 5.4 08/07/2022     How often do you need to have someone help you when you read instructions, pamphlets, or other written materials from your doctor or pharmacy?: 1 - Never  Interpreter Needed?: No  Information entered by :: Maryjean Ka CMA   Activities of Daily Living     08/10/2023    4:11 PM 10/10/2022    2:20 PM  In your present state of health, do you have any difficulty performing the following activities:  Hearing? 0 0  Vision? 0 0  Difficulty concentrating or making decisions? 0 1  Walking or climbing stairs? 0 0  Dressing or bathing? 0 0  Doing errands, shopping? 0 0  Preparing Food and eating ? N   Using the Toilet? N   In the past six months, have you accidently leaked urine? N   Do you have problems with loss of bowel control? N   Managing your Medications? N   Managing your Finances? N   Housekeeping or managing your Housekeeping? N     Patient Care Team: [name], MD as PCP - General (Family Medicine) Jomarie Longs, MD as Consulting Physician (Psychiatry) Thereasa Solo (Optometry) Gilda Crease, Latina Craver, MD (Vascular Surgery)  Indicate any recent Medical Services you may have received from other than [facility] providers in the past year (date may be approximate).     Assessment:   This is a routine wellness examination for Chase [name].  Hearing/Vision screen Hearing Screening - Comments:: Patient denies any hearing difficulties.   Vision Screening - Comments:: Wears rx glasses - up to date with routine  eye exams  Patient sees Dr. Pollyann Samples in [place]   Goals Addressed  This Visit's Progress    Patient Stated       Get finished with remodeling the laundry room       Depression Screen     08/10/2023    4:15 PM 08/04/2023    1:50 PM 07/14/2023   11:12 AM 07/14/2023   11:11 AM 04/06/2023    1:12 PM 10/10/2022    2:20 PM 08/12/2022   10:46 AM  PHQ 2/9 Scores  PHQ - 2 Score 0  0 0 0 0 0  PHQ- 9 Score 0  3  3 0 0     Information is confidential and restricted. Go to Review Flowsheets to unlock data.    Fall Risk     08/10/2023    4:13 PM 07/14/2023   11:11 AM 04/06/2023    1:12 PM 10/10/2022    2:20 PM 08/12/2022   10:46 AM  Fall Risk   Falls in the past year? 0 0 0 0 1  Number falls in past yr: 0 0 0 0 0  Injury with Fall? 0 0 0 0 1  Risk for fall due to : No Fall Risks No Fall Risks No Fall Risks No Fall Risks   Follow up Falls prevention discussed;Falls evaluation completed  Falls evaluation completed Falls evaluation completed     MEDICARE RISK AT HOME:  Medicare Risk at Home Any stairs in or around the home?: Yes If so, are there any without handrails?: No Home free of loose throw rugs in walkways, pet beds, electrical cords, etc?: Yes Adequate lighting in your home to reduce risk of falls?: Yes Life alert?: No Use of a cane, walker or w/c?: No Grab bars in the bathroom?: Yes Shower chair or bench in shower?: Yes Elevated toilet seat or a handicapped toilet?: Yes  TIMED UP AND GO:  Was the test performed?  No  Cognitive Function: Patient unable to complete 6CIT due to short term memory loss from stroke at the age of 5. Wife assisted with visit.         08/10/2023    4:14 PM 07/30/2022    3:41 PM 04/10/2017   11:02 AM  6CIT Screen  What Year? 0 points 0 points 4 points  What month? 0 points 0 points 0 points  What time? 0 points 0 points 0 points  Count back from 20 0 points 0 points 0 points  Months in reverse 0 points 0 points 0  points  Repeat phrase 0 points 2 points 10 points  Total Score 0 points 2 points 14 points    Immunizations Immunization History  Administered Date(s) Administered   Fluad Quad(high Dose 65+) 04/11/2020, 02/20/2021, 02/28/2022   Fluad Trivalent(High Dose 65+) 04/06/2023   Influenza Split 01/27/2012   Influenza, High Dose Seasonal PF 12/18/2016, 01/15/2018   Influenza, Quadrivalent, Recombinant, Inj, Pf 01/31/2019   Influenza,inj,Quad PF,6+ Mos 04/06/2015   Moderna Covid-19 Fall Seasonal Vaccine 10yrs & older 05/04/2023   PFIZER(Purple Top)SARS-COV-2 Vaccination 05/17/2019, 06/07/2019, 02/01/2020   Pneumococcal Conjugate-13 12/18/2016   Pneumococcal Polysaccharide-23 01/27/2012, 05/04/2018   Td 11/23/2015   Tdap 11/10/2005    Screening Tests Health Maintenance  Topic Date Due   Zoster Vaccines- Shingrix (1 of 2) Never done   Colonoscopy  Never done   COVID-19 Vaccine (5 - Pfizer risk 2024-25 season) 11/01/2023   INFLUENZA VACCINE  11/27/2023   Lung Cancer Screening  12/22/2023   Medicare Annual Wellness (AWV)  08/09/2024   DTaP/Tdap/Td (3 - Td or Tdap) 11/22/2025  Pneumonia Vaccine 44+ Years old  Completed   Hepatitis C Screening  Completed   HPV VACCINES  Aged Out   Meningococcal B Vaccine  Aged Out    Health Maintenance  Health Maintenance Due  Topic Date Due   Zoster Vaccines- Shingrix (1 of 2) Never done   Colonoscopy  Never done   Health Maintenance Items Addressed: Discussed Cologuard. Patient has kit at home.   Additional Screening:  Vision Screening: Recommended annual ophthalmology exams for early detection of glaucoma and other disorders of the eye.  Dental Screening: Recommended annual dental exams for proper oral hygiene  Community Resource Referral / Chronic Care Management: CRR required this visit?  No   CCM required this visit?  No     Plan:     I have personally reviewed and noted the following in the patient's chart:   Medical and  social history Use of alcohol, tobacco or illicit drugs  Current medications and supplements including opioid prescriptions. Patient is not currently taking opioid prescriptions. Functional ability and status Nutritional status Physical activity Advanced directives List of other physicians Hospitalizations, surgeries, and ER visits in previous 12 months Vitals Screenings to include cognitive, depression, and falls Referrals and appointments  In addition, I have reviewed and discussed with patient certain preventive protocols, quality metrics, and best practice recommendations. A written personalized care plan for preventive services as well as general preventive health recommendations were provided to patient.     Sallye Crease Keo Schirmer, CMA   08/10/2023   After Visit Summary: (MyChart) Due to this being a telephonic visit, the after visit summary with patients personalized plan was offered to patient via MyChart   Notes: Nothing significant to report at this time.

## 2023-08-19 ENCOUNTER — Ambulatory Visit (INDEPENDENT_AMBULATORY_CARE_PROVIDER_SITE_OTHER): Payer: PRIVATE HEALTH INSURANCE

## 2023-08-19 DIAGNOSIS — Z86718 Personal history of other venous thrombosis and embolism: Secondary | ICD-10-CM | POA: Diagnosis not present

## 2023-08-19 DIAGNOSIS — Z79899 Other long term (current) drug therapy: Secondary | ICD-10-CM | POA: Diagnosis not present

## 2023-08-19 LAB — POCT INR
INR: 2 (ref 2.0–3.0)
POC INR: 2
PT: 23.5

## 2023-08-19 NOTE — Patient Instructions (Signed)
Description   Continue 5 mg daily and return in 4 weeks

## 2023-08-20 ENCOUNTER — Telehealth: Payer: Self-pay | Admitting: Psychiatry

## 2023-08-20 LAB — HEPATIC FUNCTION PANEL
ALT: 36 IU/L (ref 0–44)
AST: 36 IU/L (ref 0–40)
Albumin: 4.3 g/dL (ref 3.8–4.8)
Alkaline Phosphatase: 57 IU/L (ref 44–121)
Bilirubin Total: 0.3 mg/dL (ref 0.0–1.2)
Bilirubin, Direct: 0.17 mg/dL (ref 0.00–0.40)
Total Protein: 6.3 g/dL (ref 6.0–8.5)

## 2023-08-20 LAB — PLATELET COUNT: Platelets: 213 10*3/uL (ref 150–450)

## 2023-08-20 LAB — SODIUM: Sodium: 131 mmol/L — ABNORMAL LOW (ref 134–144)

## 2023-08-20 NOTE — Telephone Encounter (Signed)
 Labs reviewed, hepatic function platelet count are within normal limits.  Sodium level-at 131-low.  Will have patient contact primary care provider.

## 2023-08-21 ENCOUNTER — Telehealth: Payer: Self-pay

## 2023-08-21 NOTE — Telephone Encounter (Signed)
 notified of results and also recommended to call pcp to follow up on the results. she states thank you because that is what put him in the hospital last time. she will call pcp now

## 2023-08-21 NOTE — Telephone Encounter (Signed)
 Sodium checked at Dr. Beverlyn Buckles office was slightly low at 131. It's not dangerously low, but I recommend stopping the hydrochlorothiazide  and scheduling a follow up in a month to recheck the levels and to recheck his blood pressure.

## 2023-08-21 NOTE — Telephone Encounter (Signed)
 Copied from CRM [id number]. Topic: Clinical - Lab/Test Results >> Aug 21, 2023 10:05 AM Carlatta H wrote: Reason for CRM: Patients wife would like a call back from [place] regarding patient Sodium levels//

## 2023-08-21 NOTE — Telephone Encounter (Signed)
 LMTCB-ok for E2C@ to give message and schedule patient with Dr.Fisher for follow-up and recheck the levels and BP.

## 2023-08-24 NOTE — Telephone Encounter (Signed)
 Patient's wife advised, Verbalized understanding and scheduled appointment.

## 2023-09-15 ENCOUNTER — Encounter (INDEPENDENT_AMBULATORY_CARE_PROVIDER_SITE_OTHER): Payer: Self-pay

## 2023-09-15 DIAGNOSIS — D2261 Melanocytic nevi of right upper limb, including shoulder: Secondary | ICD-10-CM | POA: Diagnosis not present

## 2023-09-15 DIAGNOSIS — D225 Melanocytic nevi of trunk: Secondary | ICD-10-CM | POA: Diagnosis not present

## 2023-09-15 DIAGNOSIS — L570 Actinic keratosis: Secondary | ICD-10-CM | POA: Diagnosis not present

## 2023-09-15 DIAGNOSIS — L821 Other seborrheic keratosis: Secondary | ICD-10-CM | POA: Diagnosis not present

## 2023-09-15 DIAGNOSIS — D2262 Melanocytic nevi of left upper limb, including shoulder: Secondary | ICD-10-CM | POA: Diagnosis not present

## 2023-09-15 DIAGNOSIS — D2271 Melanocytic nevi of right lower limb, including hip: Secondary | ICD-10-CM | POA: Diagnosis not present

## 2023-09-15 DIAGNOSIS — D2272 Melanocytic nevi of left lower limb, including hip: Secondary | ICD-10-CM | POA: Diagnosis not present

## 2023-09-16 ENCOUNTER — Encounter: Payer: Self-pay | Admitting: Family Medicine

## 2023-09-16 ENCOUNTER — Ambulatory Visit: Payer: PRIVATE HEALTH INSURANCE | Admitting: Family Medicine

## 2023-09-16 ENCOUNTER — Ambulatory Visit: Payer: PRIVATE HEALTH INSURANCE

## 2023-09-16 VITALS — BP 132/75 | HR 70 | Resp 24 | Ht 70.5 in | Wt 211.6 lb

## 2023-09-16 DIAGNOSIS — E871 Hypo-osmolality and hyponatremia: Secondary | ICD-10-CM | POA: Diagnosis not present

## 2023-09-16 DIAGNOSIS — Z86718 Personal history of other venous thrombosis and embolism: Secondary | ICD-10-CM

## 2023-09-16 DIAGNOSIS — I10 Essential (primary) hypertension: Secondary | ICD-10-CM

## 2023-09-16 LAB — POCT INR: INR: 2.6 (ref 2.0–3.0)

## 2023-09-16 NOTE — Patient Instructions (Addendum)
 Please review the attached list of medications and notify my office if there are any errors.   Please bring all of your medications to every appointment so we can make sure that our medication list is the same as yours.  Description   Continue 5 mg daily and return in 4 weeks     We will check your sodium levels when you come back for the Pt/INR in June

## 2023-09-23 ENCOUNTER — Other Ambulatory Visit (INDEPENDENT_AMBULATORY_CARE_PROVIDER_SITE_OTHER): Payer: Self-pay | Admitting: Vascular Surgery

## 2023-09-23 DIAGNOSIS — I7121 Aneurysm of the ascending aorta, without rupture: Secondary | ICD-10-CM

## 2023-09-26 NOTE — Progress Notes (Unsigned)
 MRN : [id number]  Chase [name] is a 73 y.o. (31-Mar-1951) male who presents with chief complaint of check circulation.  History of Present Illness:   The patient presents to the office for follow up evaluation of an ascending thoracic aortic aneurysm. The aneurysm was found incidentally by CT scan. Patient denies chest pain or unusual back pain, no other chest or abdominal complaints.  No history of an abrupt onset of a painful toe associated with blue discoloration.      No family history of TAA/AAA.    Patient denies amaurosis fugax or TIA symptoms.  There is no history of claudication or rest pain symptoms of the lower extremities.   The patient denies angina or shortness of breath.   CT scan dated 12/22/2022, is reviewed by me and shows an ascending TAA that measures 4.2 cm with a 3.1 cm descending thoracic aneurysm.  No change from previous CT scan.    Of the abdominal aorta obtained today demonstrates an infrarenal aorta measuring 2.8 cm in diameter.  This is slightly increased when compared to the previous study of Sep 10, 2020.  No outpatient medications have been marked as taking for the 09/28/23 encounter (Appointment) with Prescilla Brod, Ninette Basque, MD.    Past Medical History:  Diagnosis Date   Acute metabolic encephalopathy 08/09/2022   Cerebrovascular accident (CVA) (HCC) 10/09/2011   Clotting disorder (HCC)    DVT (deep venous thrombosis) (HCC)    Hearing deficit    History of ischemic vertebrobasilar artery thalamic stroke 2001   History of stroke    Hyperlipidemia    Hypertension    Patent foramen ovale    Right knee DJD    Stroke (HCC) 2001    Past Surgical History:  Procedure Laterality Date   APPENDECTOMY     FRACTURE SURGERY     R leg   INCISION AND DRAINAGE Right 06/30/2012   Procedure: INCISION AND DRAINAGE;  Surgeon: Genevie Kerns, MD;  Location: [facility] OR;  Service: Orthopedics;   Laterality: Right;   KNEE ARTHROSCOPY     right knee   KNEE BURSECTOMY Right 06/30/2012   Procedure: KNEE BURSECTOMY;  Surgeon: Genevie Kerns, MD;  Location: [facility] OR;  Service: Orthopedics;  Laterality: Right;  Pre-patellar with wound closure   TOTAL KNEE ARTHROPLASTY  01/26/2012   Procedure: TOTAL KNEE ARTHROPLASTY;  Surgeon: Genevie Kerns, MD;  Location: [facility] OR;  Service: Orthopedics;  Laterality: Right;    Social History Social History   Tobacco Use   Smoking status: Every Day    Current packs/day: 0.00    Average packs/day: 2.0 packs/day for 49.0 years (98.0 ttl pk-yrs)    Types: Cigarettes    Start date: 06/14/1967    Last attempt to quit: 06/13/2016    Years since quitting: 7.2   Smokeless tobacco: Never   Tobacco comments:    started smoking at age 78; pt had quit but says he started smoking again   Vaping Use   Vaping status: Never Used  Substance Use Topics   Alcohol  use: Yes    Alcohol /week: 28.0 -  42.0 standard drinks of alcohol     Types: 28 - 42 Cans of beer per week    Comment: 4-6 beers a day   Drug use: No    Family History Family History  Problem Relation Age of Onset   Cancer Father    Seizures Brother     Allergies  Allergen Reactions   Klonopin  [Clonazepam ] Palpitations   Chantix [Varenicline Tartrate]     confusion     REVIEW OF SYSTEMS (Negative unless checked)  Constitutional: [] Weight loss  [] Fever  [] Chills Cardiac: [] Chest pain   [] Chest pressure   [] Palpitations   [] Shortness of breath when laying flat   [] Shortness of breath with exertion. Vascular:  [x] Pain in legs with walking   [] Pain in legs at rest  [] History of DVT   [] Phlebitis   [] Swelling in legs   [] Varicose veins   [] Non-healing ulcers Pulmonary:   [] Uses home oxygen   [] Productive cough   [] Hemoptysis   [] Wheeze  [] COPD   [] Asthma Neurologic:  [] Dizziness   [] Seizures   [] History of stroke   [] History of TIA  [] Aphasia   [] Vissual changes   [] Weakness or numbness in arm    [] Weakness or numbness in leg Musculoskeletal:   [] Joint swelling   [] Joint pain   [] Low back pain Hematologic:  [] Easy bruising  [] Easy bleeding   [] Hypercoagulable state   [] Anemic Gastrointestinal:  [] Diarrhea   [] Vomiting  [] Gastroesophageal reflux/heartburn   [] Difficulty swallowing. Genitourinary:  [] Chronic kidney disease   [] Difficult urination  [] Frequent urination   [] Blood in urine Skin:  [] Rashes   [] Ulcers  Psychological:  [] History of anxiety   []  History of major depression.  Physical Examination  There were no vitals filed for this visit. There is no height or weight on file to calculate BMI. Gen: WD/WN, NAD Head: [place]/AT, No temporalis wasting.  Ear/Nose/Throat: Hearing grossly intact, nares w/o erythema or drainage Eyes: PER, EOMI, sclera nonicteric.  Neck: Supple, no masses.  No bruit or JVD.  Pulmonary:  Good air movement, no audible wheezing, no use of accessory muscles.  Cardiac: RRR, normal S1, S2, no Murmurs. Vascular:  mild trophic changes, no open wounds Vessel Right Left  Radial Palpable Palpable  Gastrointestinal: soft, non-distended. No guarding/no peritoneal signs.  Musculoskeletal: M/S 5/5 throughout.  No visible deformity.  Neurologic: CN 2-12 intact. Pain and light touch intact in extremities.  Symmetrical.  Speech is fluent. Motor exam as listed above. Psychiatric: Judgment intact, Mood & affect appropriate for pt's clinical situation. Dermatologic: No rashes or ulcers noted.  No changes consistent with cellulitis.   CBC Lab Results  Component Value Date   WBC 5.3 10/10/2022   HGB 12.6 (L) 10/10/2022   HCT 37.0 (L) 10/10/2022   MCV 96 10/10/2022   PLT 213 08/19/2023    BMET    Component Value Date/Time   NA 131 (L) 08/19/2023 1417   K 4.6 04/06/2023 1347   CL 97 04/06/2023 1347   CO2 21 04/06/2023 1347   GLUCOSE 94 04/06/2023 1347   GLUCOSE 140 (H) 08/08/2022 1354   BUN 8 04/06/2023 1347   CREATININE 0.96 04/06/2023 1347   CREATININE  1.08 03/13/2017 1538   CALCIUM  8.9 04/06/2023 1347   GFRNONAA >60 08/08/2022 1354   GFRNONAA 71 03/13/2017 1538   GFRAA 77 06/17/2019 1434   GFRAA 82 03/13/2017 1538   CrCl cannot be calculated (Patient's most recent lab result is older than the maximum 21 days allowed.).  COAG Lab Results  Component Value Date   INR 2.6 09/16/2023   INR 2.0 08/19/2023   INR 2.0 08/19/2023    Radiology No results found.   Assessment/Plan 1. Aneurysm of ascending aorta without rupture (HCC) (Primary) Recommend:  No surgery or intervention is indicated at this time.  The patient has an asymptomatic thoracic aortic aneurysm that is less than 6.0 cm in maximal diameter.  I have discussed the natural history of thoracic aortic aneurysm and the small risk of rupture for aneurysm less than 6.5 cm in size.  However, as these small aneurysms tend to enlarge over time, continued surveillance with CT scan is mandatory.   I have also discussed optimizing medical management with hypertension and lipid control and the negative effect that any tobacco products have on aneurysmal disease.  The patient is also encouraged to exercise a minimum of 30 minutes 4 times a week.   Should the patient develop new onset chest or back pain or signs of peripheral embolization they are instructed to seek medical attention immediately and to alert the physician providing care that they have an aneurysm in the chest.   The patient voices their understanding.  The patient will return as ordered with a CT scan of the chest  2. Ectasia of artery (HCC) Recommend: No surgery or intervention is indicated at this time.  The patient has an asymptomatic abdominal aortic aneurysm that is less than 4 cm in maximal diameter.    I have reviewed the natural history of abdominal aortic aneurysm and the small risk of rupture for aneurysm less than 5 cm in size.  However, as these small aneurysms tend to enlarge over time, continued  surveillance with ultrasound or CT scan is mandatory.   I have also discussed optimizing medical management with hypertension and lipid control and the negative effect that any tobacco products have on aneurysmal disease.  The patient is also encouraged to exercise a minimum of 30 minutes 4 times a week.   Should the patient develop new onset abdominal or back pain or signs of peripheral embolization they are instructed to seek medical attention immediately and to alert the physician providing care that they have an aneurysm.   The patient voices their understanding.  The patient will return in 24 months with an aortic duplex.  3. Primary hypertension Continue antihypertensive medications as already ordered, these medications have been reviewed and there are no changes at this time.  4. Centrilobular emphysema (HCC) Continue pulmonary medications and aerosols as already ordered, these medications have been reviewed and there are no changes at this time.   5. History of DVT in adulthood Recommend:   No surgery or intervention at this point in time.  IVC filter is not indicated at present.  Elevation was stressed, such as the use of a recliner.  I recommended to the patient to wear graduated compression stockings, beginning after three full days of anticoagulation.  Graduated compression should be worn on a daily basis. The patient should wear compression beginning first thing in the morning and removing them in the evening. The patient is instructed specifically not to sleep in the stockings.  In addition, behavioral modification including elevation during the day and avoidance of prolonged dependency will be initiated.       Devon Fogo, MD  09/26/2023 3:25 PM

## 2023-09-28 ENCOUNTER — Ambulatory Visit (INDEPENDENT_AMBULATORY_CARE_PROVIDER_SITE_OTHER): Payer: Medicare Other

## 2023-09-28 ENCOUNTER — Encounter (INDEPENDENT_AMBULATORY_CARE_PROVIDER_SITE_OTHER): Payer: Self-pay | Admitting: Vascular Surgery

## 2023-09-28 ENCOUNTER — Ambulatory Visit (INDEPENDENT_AMBULATORY_CARE_PROVIDER_SITE_OTHER): Payer: PRIVATE HEALTH INSURANCE | Admitting: Vascular Surgery

## 2023-09-28 VITALS — BP 194/71 | HR 64 | Resp 18 | Ht 70.0 in | Wt 208.6 lb

## 2023-09-28 DIAGNOSIS — J432 Centrilobular emphysema: Secondary | ICD-10-CM

## 2023-09-28 DIAGNOSIS — Z86718 Personal history of other venous thrombosis and embolism: Secondary | ICD-10-CM

## 2023-09-28 DIAGNOSIS — I7789 Other specified disorders of arteries and arterioles: Secondary | ICD-10-CM | POA: Diagnosis not present

## 2023-09-28 DIAGNOSIS — I7121 Aneurysm of the ascending aorta, without rupture: Secondary | ICD-10-CM

## 2023-09-28 DIAGNOSIS — I10 Essential (primary) hypertension: Secondary | ICD-10-CM

## 2023-10-09 ENCOUNTER — Ambulatory Visit: Payer: Self-pay | Admitting: Family Medicine

## 2023-10-14 ENCOUNTER — Other Ambulatory Visit: Payer: Self-pay | Admitting: Family Medicine

## 2023-10-14 ENCOUNTER — Ambulatory Visit: Payer: PRIVATE HEALTH INSURANCE

## 2023-10-14 DIAGNOSIS — Z86718 Personal history of other venous thrombosis and embolism: Secondary | ICD-10-CM | POA: Diagnosis not present

## 2023-10-14 DIAGNOSIS — E871 Hypo-osmolality and hyponatremia: Secondary | ICD-10-CM | POA: Diagnosis not present

## 2023-10-14 DIAGNOSIS — I10 Essential (primary) hypertension: Secondary | ICD-10-CM | POA: Diagnosis not present

## 2023-10-15 ENCOUNTER — Ambulatory Visit: Payer: Self-pay | Admitting: Family Medicine

## 2023-10-15 DIAGNOSIS — I10 Essential (primary) hypertension: Secondary | ICD-10-CM

## 2023-10-15 LAB — RENAL FUNCTION PANEL
Albumin: 4.3 g/dL (ref 3.8–4.8)
BUN/Creatinine Ratio: 13 (ref 10–24)
BUN: 10 mg/dL (ref 8–27)
CO2: 19 mmol/L — ABNORMAL LOW (ref 20–29)
Calcium: 8.8 mg/dL (ref 8.6–10.2)
Chloride: 96 mmol/L (ref 96–106)
Creatinine, Ser: 0.75 mg/dL — ABNORMAL LOW (ref 0.76–1.27)
Glucose: 79 mg/dL (ref 70–99)
Phosphorus: 3.5 mg/dL (ref 2.8–4.1)
Potassium: 4.6 mmol/L (ref 3.5–5.2)
Sodium: 129 mmol/L — ABNORMAL LOW (ref 134–144)
eGFR: 95 mL/min/{1.73_m2} (ref >59)

## 2023-10-15 LAB — PROTIME-INR
INR: 2.1 — ABNORMAL HIGH (ref 0.9–1.2)
Prothrombin Time: 22.4 s — ABNORMAL HIGH (ref 9.1–12.0)

## 2023-10-15 MED ORDER — DOXAZOSIN MESYLATE 1 MG PO TABS: 1.0000 mg | ORAL_TABLET | Freq: Every day | ORAL | 0 refills | Status: DC

## 2023-10-16 NOTE — Progress Notes (Signed)
 Established patient visit   [name]   DOB: [birth year]   73 y.o. Male  MRN: [id number] Visit Date: 09/16/2023  Today's healthcare provider: Jeralene Mom, MD   Chief Complaint  Patient presents with   Follow-up   Subjective    Discussed the use of AI scribe software for clinical note transcription with the patient, who gave verbal consent to proceed.  History of Present Illness   Chase [name] is a 73 year old male who presents for follow-up of groin soreness and blood pressure management.  The soreness in his groin area, which was previously more bothersome, is now almost resolved and barely noticeable. The pain has significantly improved.  He discontinued hydrochlorothiazide  on August 25, 2023, due to concerns about low sodium levels, which were noted to be slightly below normal in a previous blood workup. His most recent blood pressure reading was 131/74, indicating well-controlled hypertension without the medication.       Medications: Outpatient Medications Prior to Visit  Medication Sig   amLODipine -benazepril  (LOTREL) 5-10 MG capsule TAKE 1 CAPSULE EVERY DAY (NEED MD APPOINTMENT)   amoxicillin (AMOXIL) 500 MG tablet Take by mouth as directed. 1 hour prior to dental procedures   atorvastatin  (LIPITOR) 10 MG tablet Take 1 tablet (10 mg total) by mouth daily.   busPIRone  (BUSPAR ) 7.5 MG tablet TAKE 1 TABLET BY MOUTH 2 TIMES DAILY.   cyanocobalamin  1000 MCG tablet Take 1 tablet (1,000 mcg total) by mouth daily.   DULoxetine  (CYMBALTA ) 60 MG capsule Take 1 capsule (60 mg total) by mouth daily.   fluticasone (FLONASE) 50 MCG/ACT nasal spray Place 1 spray into both nostrils as needed.    hydrOXYzine  (VISTARIL ) 25 MG capsule Take 1 capsule (25 mg total) by mouth daily as needed for anxiety.   montelukast  (SINGULAIR ) 10 MG tablet TAKE 1 TABLET DAILY FOR COUGH, NASAL DRAINAGE AND ALLERGIES   spironolactone  (ALDACTONE ) 25 MG tablet Take 0.5 tablets (12.5 mg  total) by mouth daily.   warfarin (COUMADIN ) 5 MG tablet TAKE 1 OR 2 TABLETS (5 OR 10MG ) DAILY AS DIRECTED BY PHYSICIAN (Patient taking differently: 5 mg. TAKE 1 OR 2 TABLETS (5 OR 10MG ) DAILY AS DIRECTED BY PHYSICIAN 2 tablets MONDAY AND FRIDAY and 1 tab other days)   hydrochlorothiazide  (MICROZIDE ) 12.5 MG capsule Take 1 capsule (12.5 mg total) by mouth daily. (Patient not taking: Reported on 10/16/2023)   No facility-administered medications prior to visit.   Review of Systems  Constitutional:  Negative for appetite change, chills and fever.  Respiratory:  Negative for chest tightness, shortness of breath and wheezing.   Cardiovascular:  Negative for chest pain and palpitations.  Gastrointestinal:  Negative for abdominal pain, nausea and vomiting.       Objective    BP 132/75 (BP Location: Right Arm, Patient Position: Sitting, Cuff Size: Large)   Pulse 70   Resp (!) 24   Ht 5' 10.5 (1.791 m)   Wt 211 lb 9.6 oz (96 kg)   SpO2 100%   BMI 29.93 kg/m   Physical Exam   General: Appearance:    Well developed, well nourished male in no acute distress  Eyes:    PERRL, conjunctiva/corneas clear, EOM's intact       Lungs:     Clear to auscultation bilaterally, respirations unlabored  Heart:    Normal heart rate. Normal rhythm. No murmurs, rubs, or gallops.    MS:   All extremities are intact.  Neurologic:   Awake, alert, oriented x 3. No apparent focal neurological defect.        Results for orders placed or performed in visit on 09/16/23  POCT INR  Result Value Ref Range   INR 2.6 2.0 - 3.0   PT        Assessment & Plan        Hyponatremia and hypertension Hyponatremia likely due to hydrochlorothiazide . Sodium low but not critical. Discontinuation expected to normalize levels. - Check sodium level and PT/INR on October 09, 2023.    Return in about 4 weeks (around 10/14/2023) for INR.     Jeralene Mom, MD  [facility] [phone]  (phone) [phone] (fax)  [facility]

## 2023-10-30 IMAGING — CT CT CHEST W/O CM
2 of 4 series · 15 of 36 positions shown, 18 images · non-contrast
Comparison: 07/17/2020, 04/13/2012

CLINICAL DATA: Centrilobular emphysema, primary hypertension,
aortic aneurysm, smoker



[Series 2: thorax · axial · 0.73mm/px · z∈[-534,-264]mm · 12 of 161 slices shown, 15 images]
[im 13/161  mediastinal]
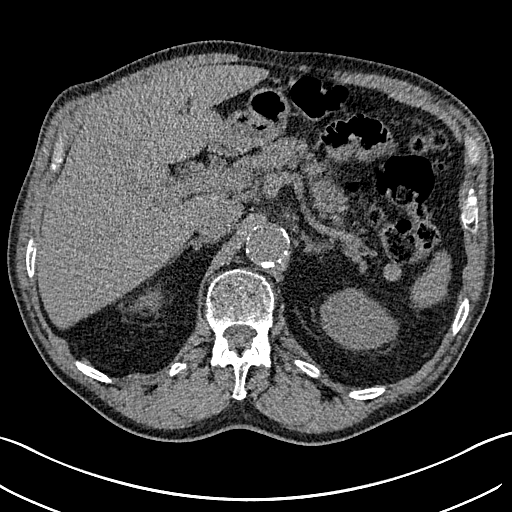
[im 13/161  lung]
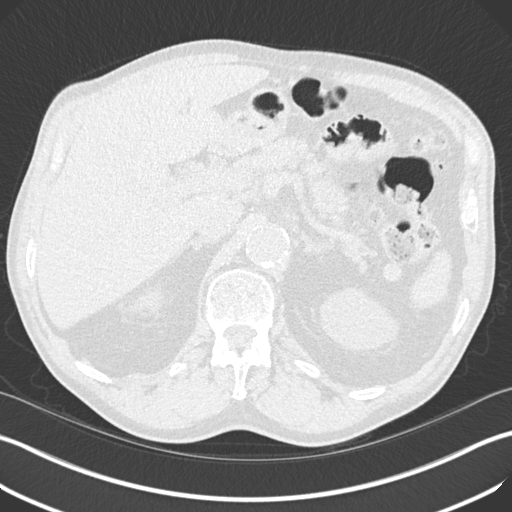
[im 25/161  lung]
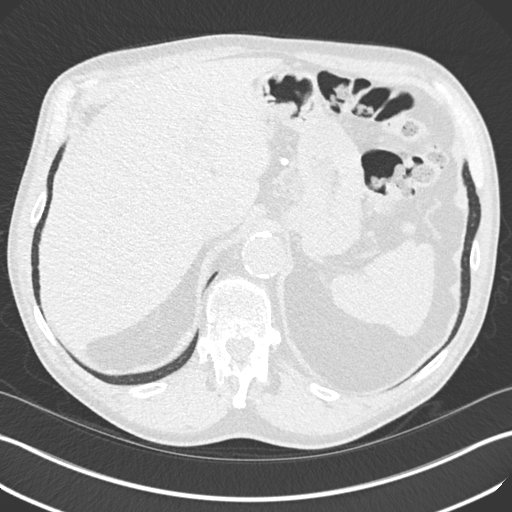
[im 37/161  lung]
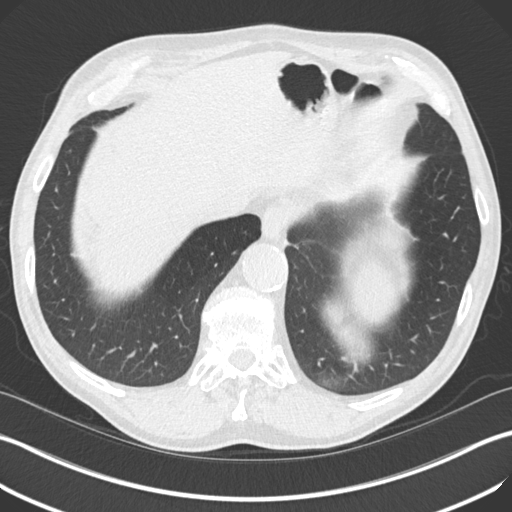
[im 50/161  lung]
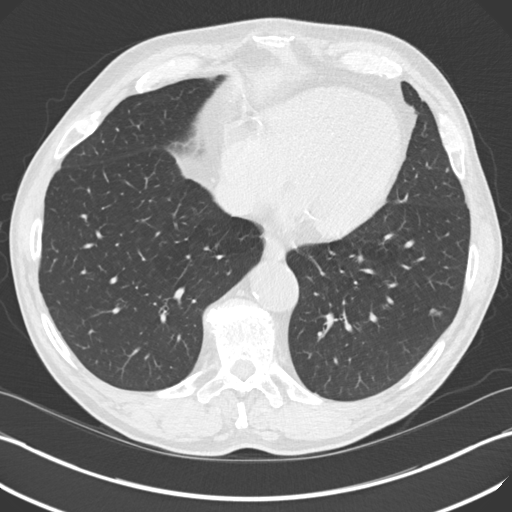
[im 62/161  mediastinal]
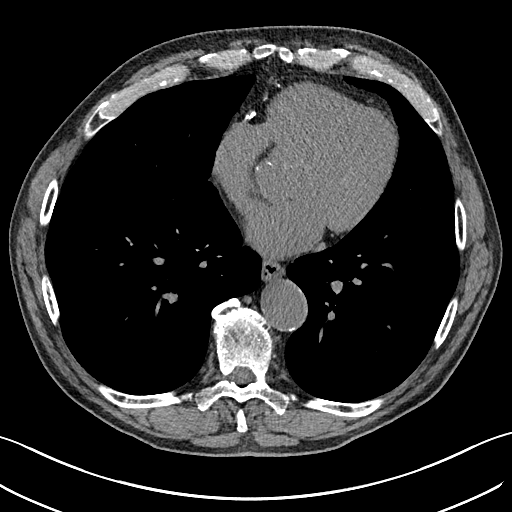
[im 62/161  lung]
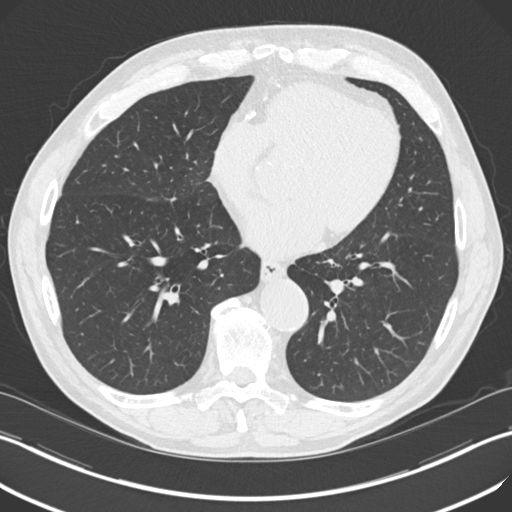
[im 74/161  lung]
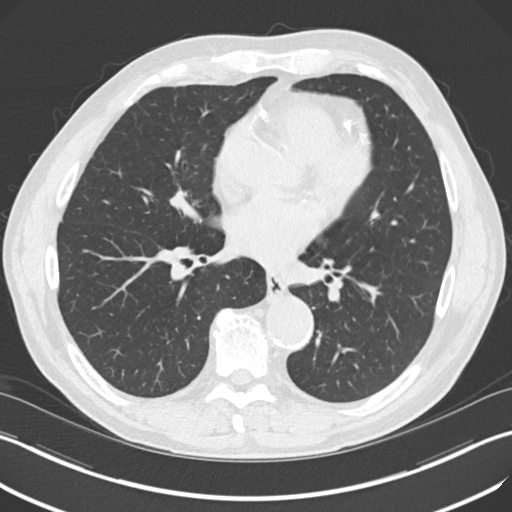
[im 87/161  lung]
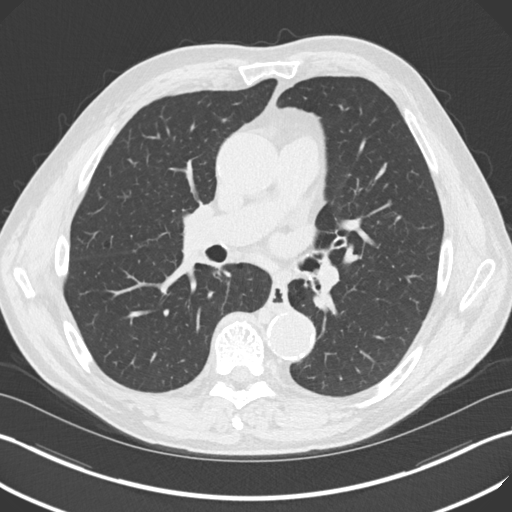
[im 99/161  lung]
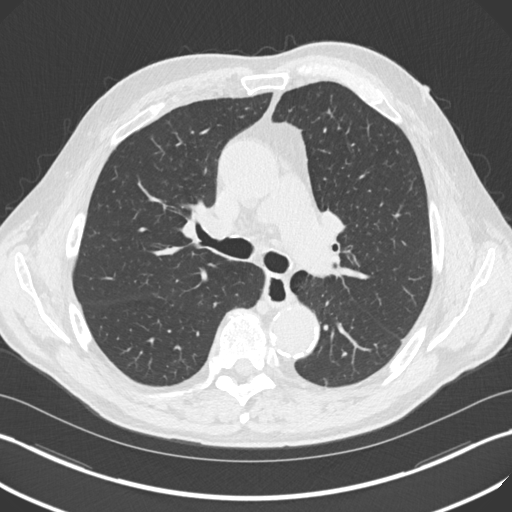
[im 111/161  mediastinal]
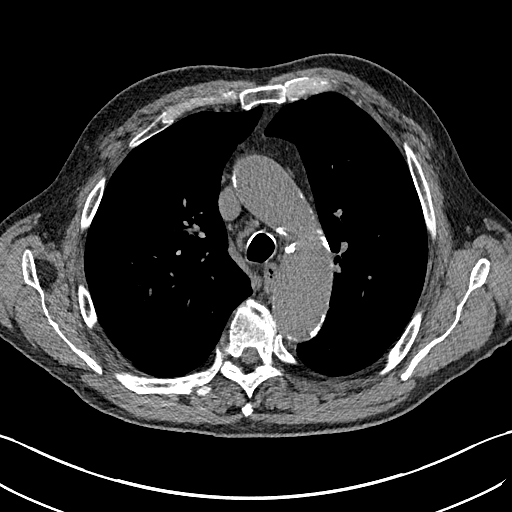
[im 111/161  lung]
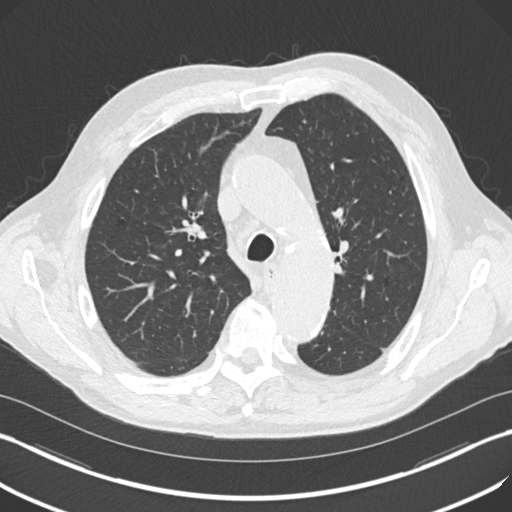
[im 124/161  lung]
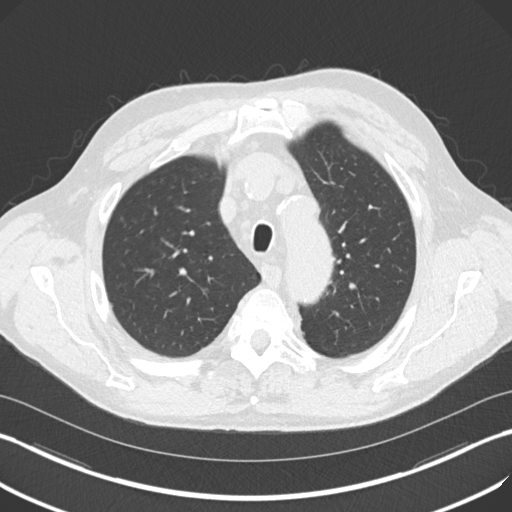
[im 136/161  lung]
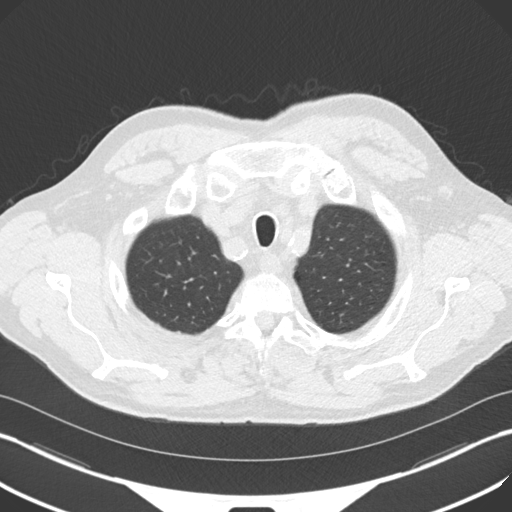
[im 148/161  lung]
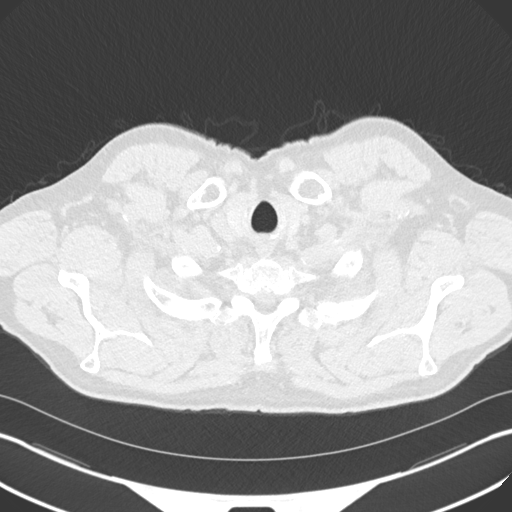

[Series 5: coronal · coronal · 0.69mm/px · 3 of 159 slices shown]
[im 32/159  lung]
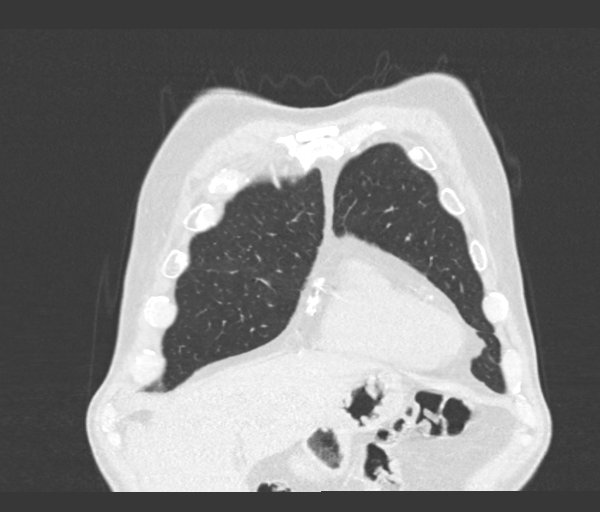
[im 64/159  lung]
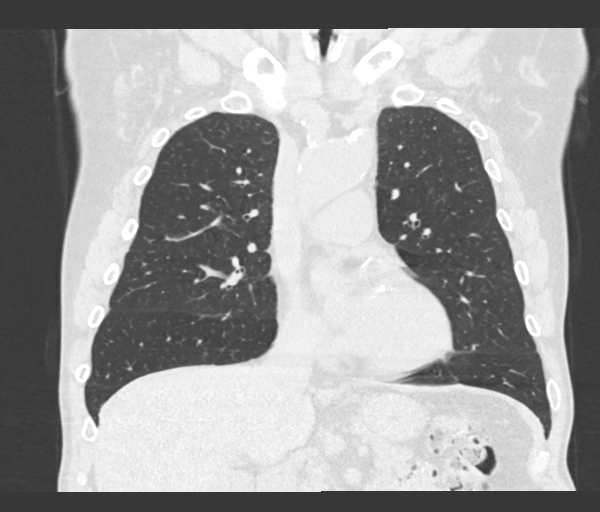
[im 95/159  lung]
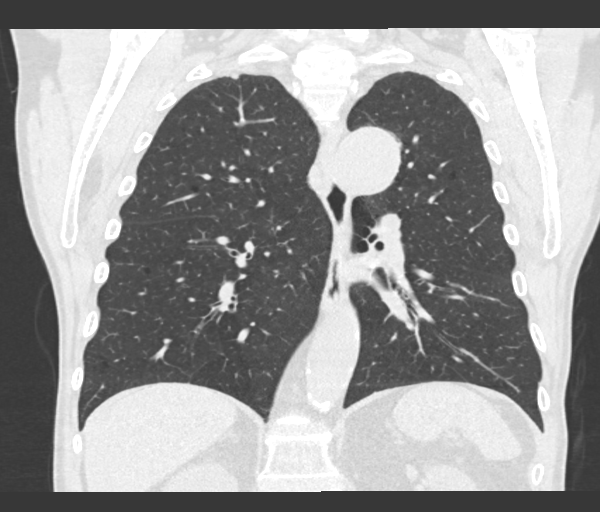

[15 of 36 positions shown; findings below may reference images not displayed]

FINDINGS: Cardiovascular: Atherosclerotic calcifications aorta, proximal great
vessels, and coronary arteries. Ascending thoracic aorta dilated
cm diameter, stable. Distal aortic arch 4.4 cm, unchanged. Heart
size normal. No pericardial effusion.

Mediastinum/Nodes: Esophagus and base of cervical region normal
appearance. No thoracic adenopathy.

Lungs/Pleura: RIGHT middle lobe nodule 9 mm image 84 stable since
2122, was 8 mm and less well defined in 9091. 3 mm LEFT upper lobe
nodule image 79 stable. 15 mm LEFT upper lobe nodule medially image
84 unchanged, contains fat consistent with hamartoma. 3 mm lingular
nodule image 111 unchanged, likely calcified granuloma. No new
infiltrate, pleural effusion, pneumothorax, or nodule

Upper Abdomen: Unremarkable.

Musculoskeletal: No acute osseous findings.
IMPRESSION: Stable pulmonary nodules; may continue low dose screening as
clinically indicated.

Atherosclerotic calcifications including cornary arteries.

Ascending thoracic aortic aneurysm. Recommend semi-annual imaging
followup by CTA or MRA and referral to cardiothoracic surgery if not
already obtained. This recommendation follows 2707
ACCF/AHA/AATS/ACR/ASA/SCA/CHI/DATABEX/MAN/CUNDIFF Guidelines for the
Diagnosis and Management of Patients With Thoracic Aortic Disease.
Circulation. 2707; 121: E266-e369. Aortic aneurysm NOS (BXY9I-J11.W)

Aortic Atherosclerosis (BXY9I-9NP.P) and Aortic aneurysm NOS
(BXY9I-J11.W).

## 2023-11-13 ENCOUNTER — Ambulatory Visit (INDEPENDENT_AMBULATORY_CARE_PROVIDER_SITE_OTHER): Payer: PRIVATE HEALTH INSURANCE | Admitting: Family Medicine

## 2023-11-13 ENCOUNTER — Encounter: Payer: Self-pay | Admitting: Family Medicine

## 2023-11-13 VITALS — BP 108/64 | HR 77 | Resp 16 | Ht 70.5 in | Wt 215.0 lb

## 2023-11-13 DIAGNOSIS — E871 Hypo-osmolality and hyponatremia: Secondary | ICD-10-CM

## 2023-11-13 DIAGNOSIS — I10 Essential (primary) hypertension: Secondary | ICD-10-CM

## 2023-11-13 NOTE — Progress Notes (Signed)
 Established patient visit   [name]   DOB: 09/20/1950   73 y.o. Male  MRN: [id number] Visit Date: 11/13/2023  Today's healthcare provider: Nancyann Perry, MD   Chief Complaint  Patient presents with   Medical Management of Chronic Issues    Follow-up HTN and sodium levels   Subjective    HPI  Follow up hypertension and hyponatremia since adding doxazosin  1 month ago. At that time had very high SBP at his vascular appointment and low sodium level.   BP Readings from Last 3 Encounters:  11/13/23 114/79  09/28/23 (!) 194/71  09/16/23 132/75   Lab Results  Component Value Date   NA 129 (L) 10/14/2023   K 4.6 10/14/2023   CREATININE 0.75 (L) 10/14/2023   EGFR 95 10/14/2023   GLUCOSE 79 10/14/2023   He is taking doxazosin  consistently every day with no apparent side effects. His wife has an automatic BP cuff, but has not been checking home Bps lately.   Medications: Outpatient Medications Prior to Visit  Medication Sig Note   amLODipine -benazepril  (LOTREL) 5-10 MG capsule TAKE 1 CAPSULE EVERY DAY (NEED MD APPOINTMENT)    amoxicillin (AMOXIL) 500 MG tablet Take by mouth as directed. 1 hour prior to dental procedures    atorvastatin  (LIPITOR) 10 MG tablet Take 1 tablet (10 mg total) by mouth daily.    busPIRone  (BUSPAR ) 7.5 MG tablet TAKE 1 TABLET BY MOUTH 2 TIMES DAILY.    cyanocobalamin  1000 MCG tablet Take 1 tablet (1,000 mcg total) by mouth daily.    doxazosin  (CARDURA ) 1 MG tablet Take 1 tablet (1 mg total) by mouth daily.    DULoxetine  (CYMBALTA ) 60 MG capsule Take 1 capsule (60 mg total) by mouth daily.    fluticasone (FLONASE) 50 MCG/ACT nasal spray Place 1 spray into both nostrils as needed.     hydrOXYzine  (VISTARIL ) 25 MG capsule Take 1 capsule (25 mg total) by mouth daily as needed for anxiety.    montelukast  (SINGULAIR ) 10 MG tablet TAKE 1 TABLET DAILY FOR COUGH, NASAL DRAINAGE AND ALLERGIES    warfarin (COUMADIN ) 5 MG tablet TAKE 1 OR 2  TABLETS (5 OR 10MG ) DAILY AS DIRECTED BY PHYSICIAN (Patient taking differently: 5 mg. TAKE 1 OR 2 TABLETS (5 OR 10MG ) DAILY AS DIRECTED BY PHYSICIAN 2 tablets MONDAY AND FRIDAY and 1 tab other days)    [DISCONTINUED] spironolactone  (ALDACTONE ) 25 MG tablet Take 0.5 tablets (12.5 mg total) by mouth daily. 11/13/2023: low sodium   No facility-administered medications prior to visit.    Review of Systems  Constitutional:  Negative for appetite change, chills and fever.  Respiratory:  Negative for chest tightness, shortness of breath and wheezing.   Cardiovascular:  Negative for chest pain and palpitations.  Gastrointestinal:  Negative for abdominal pain, nausea and vomiting.       Objective    BP 108/64   Pulse 77   Resp 16   Ht 5' 10.5 (1.791 m)   Wt 215 lb (97.5 kg)   SpO2 99%   BMI 30.41 kg/m    Physical Exam   General appearance: Mildly obese male, cooperative and in no acute distress Head: Normocephalic, without obvious abnormality, atraumatic Respiratory: Respirations even and unlabored, normal respiratory rate Extremities: All extremities are intact.  Skin: Skin color, texture, turgor normal. No rashes seen  Psych: Appropriate mood and affect. Neurologic: Mental status: Alert, oriented to person, place, and time, thought content appropriate.    Assessment &  Plan     1. Primary hypertension (Primary) Much improved since addition of doxazosin . Consider underlying hyponatremia below, will stop spironolactone  for now. She is going to start checking weekly home BP. Will follow up here in 1 month.   2. Hyponatremia Mulifactorial, likely exacerbated by SNRI and spironolactone . Stop spironolactone  since BP is very well controlled since adding doxazosin . Check electrolyte at follow up appointment.    Return in about 1 month (around 12/14/2023) for Hypertension.         Nancyann Perry, MD  [facility] [phone] (phone) [phone]  (fax)  [facility]

## 2023-11-13 NOTE — Patient Instructions (Signed)
 Chase [name]  Please review the attached list of medications and notify my office if there are any errors.   . Please bring all of your medications to every appointment so we can make sure that our medication list is the same as yours.

## 2023-11-23 ENCOUNTER — Other Ambulatory Visit: Payer: Self-pay | Admitting: Family Medicine

## 2023-11-23 DIAGNOSIS — J301 Allergic rhinitis due to pollen: Secondary | ICD-10-CM

## 2023-11-23 DIAGNOSIS — I10 Essential (primary) hypertension: Secondary | ICD-10-CM

## 2023-12-16 ENCOUNTER — Other Ambulatory Visit: Payer: Self-pay

## 2023-12-16 DIAGNOSIS — Z122 Encounter for screening for malignant neoplasm of respiratory organs: Secondary | ICD-10-CM

## 2023-12-16 DIAGNOSIS — F17210 Nicotine dependence, cigarettes, uncomplicated: Secondary | ICD-10-CM

## 2023-12-16 DIAGNOSIS — Z87891 Personal history of nicotine dependence: Secondary | ICD-10-CM

## 2023-12-18 ENCOUNTER — Ambulatory Visit (INDEPENDENT_AMBULATORY_CARE_PROVIDER_SITE_OTHER): Payer: PRIVATE HEALTH INSURANCE | Admitting: Family Medicine

## 2023-12-18 ENCOUNTER — Encounter: Payer: Self-pay | Admitting: Family Medicine

## 2023-12-18 VITALS — BP 135/77 | HR 66 | Ht 70.0 in | Wt 218.0 lb

## 2023-12-18 DIAGNOSIS — I6930 Unspecified sequelae of cerebral infarction: Secondary | ICD-10-CM

## 2023-12-18 DIAGNOSIS — Z125 Encounter for screening for malignant neoplasm of prostate: Secondary | ICD-10-CM

## 2023-12-18 DIAGNOSIS — E871 Hypo-osmolality and hyponatremia: Secondary | ICD-10-CM

## 2023-12-18 DIAGNOSIS — I10 Essential (primary) hypertension: Secondary | ICD-10-CM | POA: Diagnosis not present

## 2023-12-18 MED ORDER — DOXAZOSIN MESYLATE 1 MG PO TABS: 1.0000 mg | ORAL_TABLET | Freq: Every day | ORAL | 3 refills | Status: AC

## 2023-12-18 MED ORDER — WARFARIN SODIUM 5 MG PO TABS: ORAL_TABLET | ORAL | 3 refills | Status: DC

## 2023-12-18 NOTE — Progress Notes (Signed)
 Established patient visit   [name]   DOB: 05/04/1950   73 y.o. Male  MRN: [id number] Visit Date: 12/18/2023  Today's healthcare provider: Nancyann Perry, MD   Chief Complaint  Patient presents with   Hypertension   Subjective    Discussed the use of AI scribe software for clinical note transcription with the patient, who gave verbal consent to proceed.  History of Present Illness   Chase [name] is a 73 year old male who presents for a follow-up after discontinuation of spironolactone  due to hyponatremia.  Spironolactone  was discontinued one month ago due to persistent hyponatremia. He notes that his blood pressure tends to be higher during medical visits.  He is currently taking doxazosin  and warfarin, and his wife has requested refills for these medications.     BP Readings from Last 3 Encounters:  12/18/23 135/77  11/13/23 108/64  09/28/23 (!) 194/71     Medications: Outpatient Medications Prior to Visit  Medication Sig   amLODipine -benazepril  (LOTREL) 5-10 MG capsule TAKE 1 CAPSULE EVERY DAY (NEED MD APPOINTMENT)   amoxicillin (AMOXIL) 500 MG tablet Take by mouth as directed. 1 hour prior to dental procedures   atorvastatin  (LIPITOR) 10 MG tablet Take 1 tablet (10 mg total) by mouth daily.   busPIRone  (BUSPAR ) 7.5 MG tablet TAKE 1 TABLET BY MOUTH 2 TIMES DAILY.   cyanocobalamin  1000 MCG tablet Take 1 tablet (1,000 mcg total) by mouth daily.   DULoxetine  (CYMBALTA ) 60 MG capsule Take 1 capsule (60 mg total) by mouth daily.   fluticasone (FLONASE) 50 MCG/ACT nasal spray Place 1 spray into both nostrils as needed.    hydrOXYzine  (VISTARIL ) 25 MG capsule Take 1 capsule (25 mg total) by mouth daily as needed for anxiety.   montelukast  (SINGULAIR ) 10 MG tablet TAKE 1 TABLET DAILY FOR COUGH, NASAL DRAINAGE AND ALLERGIES   doxazosin  (CARDURA ) 1 MG tablet Take 1 tablet (1 mg total) by mouth daily.   warfarin (COUMADIN ) 5 MG tablet TAKE 1 OR 2 TABLETS  (5 OR 10MG ) DAILY AS DIRECTED BY PHYSICIAN (Patient taking differently: 5 mg. TAKE 1 OR 2 TABLETS (5 OR 10MG ) DAILY AS DIRECTED BY PHYSICIAN 2 tablets MONDAY AND FRIDAY and 1 tab other days)   No facility-administered medications prior to visit.       Objective    BP 135/77   Pulse 66   Ht 5' 10 (1.778 m)   Wt 218 lb (98.9 kg)   SpO2 100%   BMI 31.28 kg/m   Physical Exam   General appearance: Overweight male, cooperative and in no acute distress Head: Normocephalic, without obvious abnormality, atraumatic Respiratory: Respirations even and unlabored, normal respiratory rate Extremities: All extremities are intact.  Skin: Skin color, texture, turgor normal. No rashes seen  Psych: Appropriate mood and affect. Neurologic: Mental status: Alert, oriented to person, place, and time, thought content appropriate.    Assessment & Plan    1. Primary hypertension Fairly well controlled off of spironolactone . Reporting home BP better than office reading.  - doxazosin  (CARDURA ) 1 MG tablet; Take 1 tablet (1 mg total) by mouth daily.  Dispense: 90 tablet; Refill: 3  2. Prostate cancer screening (Primary)  - PSA Total (Reflex To Free)  3. Hyponatremia Likely secondary to diuretics which have all been discontinued.  - Comprehensive metabolic panel with GFR  4. Late effect of cerebrovascular accident (CVA) refill warfarin (COUMADIN ) 5 MG tablet; TAKE 1 OR 2 TABLETS (5 OR 10MG )  DAILY AS DIRECTED BY PHYSICIAN  Dispense: 180 tablet; Refill: 3  On atorvastatin  - Lipid panel      Nancyann Perry, MD  [facility] [phone] (phone) [phone] (fax)  [facility]

## 2023-12-19 LAB — COMPREHENSIVE METABOLIC PANEL WITH GFR
ALT: 26 IU/L (ref 0–44)
AST: 30 IU/L (ref 0–40)
Albumin: 4.3 g/dL (ref 3.8–4.8)
Alkaline Phosphatase: 67 IU/L (ref 44–121)
BUN/Creatinine Ratio: 12 (ref 10–24)
BUN: 9 mg/dL (ref 8–27)
Bilirubin Total: 0.3 mg/dL (ref 0.0–1.2)
CO2: 19 mmol/L — ABNORMAL LOW (ref 20–29)
Calcium: 8.8 mg/dL (ref 8.6–10.2)
Chloride: 99 mmol/L (ref 96–106)
Creatinine, Ser: 0.77 mg/dL (ref 0.76–1.27)
Globulin, Total: 2.3 g/dL (ref 1.5–4.5)
Glucose: 78 mg/dL (ref 70–99)
Potassium: 4.3 mmol/L (ref 3.5–5.2)
Sodium: 136 mmol/L (ref 134–144)
Total Protein: 6.6 g/dL (ref 6.0–8.5)
eGFR: 95 mL/min/1.73 (ref >59)

## 2023-12-19 LAB — LIPID PANEL
Chol/HDL Ratio: 2 ratio (ref 0.0–5.0)
Cholesterol, Total: 132 mg/dL (ref 100–199)
HDL: 65 mg/dL (ref >39)
LDL Chol Calc (NIH): 52 mg/dL (ref 0–99)
Triglycerides: 79 mg/dL (ref 0–149)
VLDL Cholesterol Cal: 15 mg/dL (ref 5–40)

## 2023-12-19 LAB — PSA TOTAL (REFLEX TO FREE): Prostate Specific Ag, Serum: 1.3 ng/mL (ref 0.0–4.0)

## 2023-12-22 ENCOUNTER — Ambulatory Visit: Payer: Self-pay | Admitting: Family Medicine

## 2023-12-23 ENCOUNTER — Other Ambulatory Visit: Payer: Self-pay | Admitting: Psychiatry

## 2023-12-23 DIAGNOSIS — F17200 Nicotine dependence, unspecified, uncomplicated: Secondary | ICD-10-CM

## 2023-12-25 ENCOUNTER — Other Ambulatory Visit: Payer: Self-pay | Admitting: Family Medicine

## 2023-12-25 DIAGNOSIS — I10 Essential (primary) hypertension: Secondary | ICD-10-CM

## 2023-12-30 ENCOUNTER — Telehealth (INDEPENDENT_AMBULATORY_CARE_PROVIDER_SITE_OTHER): Admitting: Psychiatry

## 2023-12-30 ENCOUNTER — Encounter: Payer: Self-pay | Admitting: Psychiatry

## 2023-12-30 DIAGNOSIS — G2401 Drug induced subacute dyskinesia: Secondary | ICD-10-CM

## 2023-12-30 DIAGNOSIS — F0631 Mood disorder due to known physiological condition with depressive features: Secondary | ICD-10-CM

## 2023-12-30 DIAGNOSIS — F418 Other specified anxiety disorders: Secondary | ICD-10-CM

## 2023-12-30 DIAGNOSIS — Z789 Other specified health status: Secondary | ICD-10-CM | POA: Insufficient documentation

## 2023-12-30 DIAGNOSIS — F17200 Nicotine dependence, unspecified, uncomplicated: Secondary | ICD-10-CM

## 2023-12-30 DIAGNOSIS — F09 Unspecified mental disorder due to known physiological condition: Secondary | ICD-10-CM

## 2023-12-30 NOTE — Progress Notes (Unsigned)
 Virtual Visit via Video Note  I connected with Chase [name] on 12/30/23 at 11:40 AM EDT by a video enabled telemedicine application and verified that I am speaking with the correct person using two identifiers.  Location Provider Location : [facility] Patient Location : Home [facility])  Participants: Patient , Spouse,Provider   I discussed the limitations of evaluation and management by telemedicine and the availability of in person appointments. The patient expressed understanding and agreed to proceed.   I discussed the assessment and treatment plan with the patient. The patient was provided an opportunity to ask questions and all were answered. The patient agreed with the plan and demonstrated an understanding of the instructions.   The patient was advised to call back or seek an in-person evaluation if the symptoms worsen or if the condition fails to improve as anticipated.  [facility] MD OP Progress Note  12/30/2023 12:02 PM ZEIN HELBING  MRN:  [id number]  Chief Complaint:  Chief Complaint  Patient presents with   Follow-up   Anxiety   Depression   Medication Refill   Discussed the use of AI scribe software for clinical note transcription with the patient, who gave verbal consent to proceed.  History of Present Illness Chase [name] is a 73 year old Caucasian male, married, lives in the room, has a history of depression, anxiety, cognitive disorder, alcohol  use disorder, tobacco use disorder was evaluated by telemedicine today.  Here for follow-up. He is accompanied by wife, Chase [name].  Since his last visit in April, he reports doing well and notes no significant changes in mood, anxiety, or sleep. He denies concerns related to anxiety, depression, or sleep problems. He reports that his appetite remains good.  He and his wife are currently camping at the [place] and he [place], sitting on the deck, and appreciating the quiet environment. He stays  engaged with activities such as building steps and caring for Chase [name], who has been recovering from a back issue.  Reports sleep is overall good.  He continues to take duloxetine , hydroxyzine  as needed and buspirone .  Denies side effects.  He reports current alcohol  use, typically 4 drinks per day. He denies binge drinking. He reports current tobacco use.  He is not interested in quitting smoking or drinking.  Denies any current suicidality, homicidality or perceptual disturbances.    Visit Diagnosis:    ICD-10-CM   1. Depressive disorder due to another medical condition with depressive features  F06.31     2. Other specified anxiety disorders  F41.8    with limited symptom attacks    3. Tobacco use disorder  F17.200     4. Significant use of alcohol   Z78.9     5. Tardive dyskinesia  G24.01     6. Mild cognitive disorder  F09       Past Psychiatric History: I have reviewed past psychiatric history from progress note on 08/10/2017.  Past trials of medications-Effexor , Wellbutrin, Cymbalta , Xanax , Klonopin .  Past Medical History:  Past Medical History:  Diagnosis Date   Acute metabolic encephalopathy 08/09/2022   Cerebrovascular accident (CVA) (HCC) 10/09/2011   Clotting disorder (HCC)    DVT (deep venous thrombosis) (HCC)    Hearing deficit    History of ischemic vertebrobasilar artery thalamic stroke 2001   History of stroke    Hyperlipidemia    Hypertension    Patent foramen ovale    Right knee DJD    Stroke (HCC) 2001  Past Surgical History:  Procedure Laterality Date   APPENDECTOMY     FRACTURE SURGERY     R leg   INCISION AND DRAINAGE Right 06/30/2012   Procedure: INCISION AND DRAINAGE;  Surgeon: Lamar DELENA Millman, MD;  Location: [facility] OR;  Service: Orthopedics;  Laterality: Right;   KNEE ARTHROSCOPY     right knee   KNEE BURSECTOMY Right 06/30/2012   Procedure: KNEE BURSECTOMY;  Surgeon: Lamar DELENA Millman, MD;  Location: [facility] OR;  Service: Orthopedics;  Laterality:  Right;  Pre-patellar with wound closure   TOTAL KNEE ARTHROPLASTY  01/26/2012   Procedure: TOTAL KNEE ARTHROPLASTY;  Surgeon: Lamar DELENA Millman, MD;  Location: [facility] OR;  Service: Orthopedics;  Laterality: Right;    Family Psychiatric History: I have reviewed family psychiatric history from progress note on 08/10/2017.  Family History:  Family History  Problem Relation Age of Onset   Cancer Father    Seizures Brother     Social History: I have reviewed social history from progress note on 08/10/2017. Social History   Socioeconomic History   Marital status: Married    Spouse name: debra   Number of children: 0   Years of education: 12   Highest education level: Master's degree (e.g., MA, MS, MEng, MEd, MSW, MBA)  Occupational History   Occupation: retired   Occupation: disabled @ age 47  Tobacco Use   Smoking status: Every Day    Current packs/day: 0.00    Average packs/day: 2.0 packs/day for 49.0 years (98.0 ttl pk-yrs)    Types: Cigarettes    Start date: 06/14/1967    Last attempt to quit: 06/13/2016    Years since quitting: 7.5   Smokeless tobacco: Never   Tobacco comments:    started smoking at age 48; pt had quit but says he started smoking again   Vaping Use   Vaping status: Never Used  Substance and Sexual Activity   Alcohol  use: Yes    Alcohol /week: 28.0 - 42.0 standard drinks of alcohol     Types: 28 - 42 Cans of beer per week    Comment: 4-6 beers a day   Drug use: No   Sexual activity: Not Currently  Other Topics Concern   Not on file  Social History Narrative   Not on file   Social Drivers of Health   Financial Resource Strain: Low Risk  (08/10/2023)   Overall Financial Resource Strain (CARDIA)    Difficulty of Paying Living Expenses: Not hard at all  Food Insecurity: No Food Insecurity (08/10/2023)   Hunger Vital Sign    Worried About Running Out of Food in the Last Year: Never true    Ran Out of Food in the Last Year: Never true  Transportation Needs: No  Transportation Needs (08/10/2023)   PRAPARE - Administrator, Civil Service (Medical): No    Lack of Transportation (Non-Medical): No  Physical Activity: Patient Declined (08/10/2023)   Exercise Vital Sign    Days of Exercise per Week: Patient declined    Minutes of Exercise per Session: Patient declined  Stress: No Stress Concern Present (08/10/2023)   [employer] of Occupational Health - Occupational Stress Questionnaire    Feeling of Stress : Not at all  Social Connections: Moderately Integrated (08/10/2023)   Social Connection and Isolation Panel    Frequency of Communication with Friends and Family: More than three times a week    Frequency of Social Gatherings with Friends and Family: More than three times  a week    Attends Religious Services: More than 4 times per year    Active Member of Clubs or Organizations: No    Attends Banker Meetings: Never    Marital Status: Married    Allergies:  Allergies  Allergen Reactions   Klonopin  [Clonazepam ] Palpitations   Chantix [Varenicline Tartrate]     confusion    Metabolic Disorder Labs: Lab Results  Component Value Date   HGBA1C 5.4 08/07/2022   MPG 108 08/07/2022   No results found for: PROLACTIN Lab Results  Component Value Date   CHOL 132 12/18/2023   TRIG 79 12/18/2023   HDL 65 12/18/2023   CHOLHDL 2.0 12/18/2023   VLDL 12 08/08/2022   LDLCALC 52 12/18/2023   LDLCALC 55 10/10/2022   Lab Results  Component Value Date   TSH 3.310 08/12/2022   TSH 2.25 03/13/2017    Therapeutic Level Labs: No results found for: LITHIUM No results found for: VALPROATE No results found for: CBMZ  Current Medications: Current Outpatient Medications  Medication Sig Dispense Refill   amLODipine -benazepril  (LOTREL) 5-10 MG capsule TAKE 1 CAPSULE EVERY DAY (NEED MD APPOINTMENT) 90 capsule 3   amoxicillin (AMOXIL) 500 MG tablet Take by mouth as directed. 1 hour prior to dental procedures      atorvastatin  (LIPITOR) 10 MG tablet Take 1 tablet (10 mg total) by mouth daily. 90 tablet 4   busPIRone  (BUSPAR ) 7.5 MG tablet TAKE 1 TABLET BY MOUTH TWICE A DAY 180 tablet 1   cyanocobalamin  1000 MCG tablet Take 1 tablet (1,000 mcg total) by mouth daily.     doxazosin  (CARDURA ) 1 MG tablet Take 1 tablet (1 mg total) by mouth daily. 90 tablet 3   DULoxetine  (CYMBALTA ) 60 MG capsule Take 1 capsule (60 mg total) by mouth daily. 90 capsule 3   fluticasone (FLONASE) 50 MCG/ACT nasal spray Place 1 spray into both nostrils as needed.      hydrOXYzine  (VISTARIL ) 25 MG capsule Take 1 capsule (25 mg total) by mouth daily as needed for anxiety. 90 capsule 1   montelukast  (SINGULAIR ) 10 MG tablet TAKE 1 TABLET DAILY FOR COUGH, NASAL DRAINAGE AND ALLERGIES 90 tablet 3   warfarin (COUMADIN ) 5 MG tablet TAKE 1 OR 2 TABLETS (5 OR 10MG ) DAILY AS DIRECTED BY PHYSICIAN 180 tablet 3   No current facility-administered medications for this visit.     Musculoskeletal: Strength & Muscle Tone: UTA Gait & Station: Seated Patient leans: N/A  Psychiatric Specialty Exam: Review of Systems  Psychiatric/Behavioral: Negative.      There were no vitals taken for this visit.There is no height or weight on file to calculate BMI.  General Appearance: Fairly Groomed  Eye Contact:  Fair  Speech:  Normal Rate  Volume:  Normal  Mood:  Euthymic  Affect:  Congruent  Thought Process:  Goal Directed and Descriptions of Associations: Intact  Orientation:  Full (Time, Place, and Person)  Thought Content: Logical   Suicidal Thoughts:  No  Homicidal Thoughts:  No  Memory:  Immediate;   Fair Recent;   Poor Remote;   Poor  Judgement:  Fair  Insight:  Fair  Psychomotor Activity:  Normal  Concentration:  Concentration: Fair and Attention Span: Fair  Recall:  Poor  Fund of Knowledge: Fair  Language: Poor  Akathisia:  No  Handed:  Right  AIMS (if indicated): not done  Assets:  Desire for Improvement Housing Social  Support Transportation  ADL's:  Intact  Cognition: Baseline  Sleep:  Fair   Screenings: AIMS    Flowsheet Row Office Visit from 07/10/2022 in [facility] Video Visit from 05/20/2021 in [facility] Video Visit from 01/21/2021 in [facility] Office Visit from 10/16/2020 in [facility]  AIMS Total Score 1 2 2 2    AUDIT    Flowsheet Row Office Visit from 06/25/2021 in [facility] Office Visit from 07/04/2020 in [facility] Clinical Support from 06/19/2020 in [facility] Clinical Support from 04/25/2019 in [facility]  Alcohol  Use Disorder Identification Test Final Score (AUDIT) 11 12 13 10    GAD-7    Flowsheet Row Office Visit from 09/16/2023 in [facility] Office Visit from 08/04/2023 in [facility] Office Visit from 07/14/2023 in [facility] Office Visit from 04/06/2023 in [facility] Office Visit from 07/10/2022 in [facility]  Total GAD-7 Score 0 0 0 0 1   PHQ2-9    Flowsheet Row Office Visit from 09/16/2023 in [facility] Clinical Support from 08/10/2023 in [facility] Office Visit from 08/04/2023 in [facility] Office Visit from 07/14/2023 in [facility] Office Visit from 04/06/2023 in [facility]  PHQ-2 Total Score 0 0 0 0 0  PHQ-9 Total Score 0 0 -- 3 3   Flowsheet Row Video Visit from 12/30/2023 in [facility] Office Visit from 08/04/2023 in [facility]  Video Visit from 02/02/2023 in [facility]  C-SSRS RISK CATEGORY No Risk No Risk No Risk     Assessment and Plan: CLARA SMOLEN is a 73 year old Caucasian male with history of depression, anxiety, cognitive impairment was evaluated by telemedicine today.  Discussed assessment and plan as noted below.  Depression in remission Other specified anxiety-stable Currently reports overall mood symptoms are stable. Continue Cymbalta  60 mg daily Continue BuSpar  7.5 mg daily with the option to take an additional half tablet if needed Continue hydroxyzine  25 mg daily as needed Reviewed sodium level dated 12/18/2023-136-within normal limits. Platelet counts-within normal limits for 23 2025.  Cognitive disorder chronic-likely mild Cognitive assessment revealed difficulty with short-term memory. Currently has good support system.  Continue follow-up with neurology as needed  Tobacco use disorder-unstable Alcohol  use significant-unstable Continues to smoke cigarettes and uses alcohol  regularly.  Not interested in quitting.  Collateral information was obtained from wife who was present in session.  Follow-up Follow-up in clinic in 5 months or sooner if needed.   Patient/Guardian was advised Release of Information must be obtained prior to any record release in order to collaborate their care with an outside provider. Patient/Guardian was advised if they have not already done so to contact the registration department to sign all necessary forms in order for us  to release information regarding their care.   Consent: Patient/Guardian gives verbal consent for treatment and assignment of benefits for services provided during this visit. Patient/Guardian expressed understanding and agreed to proceed.  This note was generated in part or whole with voice recognition software. Voice recognition is usually quite accurate but there are transcription errors that can and  very often do occur. I apologize for any typographical errors that were  not detected and corrected.     Lewi Drost, MD 12/30/2023, 12:02 PM

## 2024-01-04 ENCOUNTER — Ambulatory Visit
Admission: RE | Admit: 2024-01-04 | Discharge: 2024-01-04 | Disposition: A | Discharge status: Home / Self Care | Payer: PRIVATE HEALTH INSURANCE | Source: Ambulatory Visit | Attending: Family Medicine | Admitting: Family Medicine

## 2024-01-04 DIAGNOSIS — F17210 Nicotine dependence, cigarettes, uncomplicated: Secondary | ICD-10-CM | POA: Insufficient documentation

## 2024-01-04 DIAGNOSIS — Z87891 Personal history of nicotine dependence: Secondary | ICD-10-CM | POA: Insufficient documentation

## 2024-01-04 DIAGNOSIS — Z122 Encounter for screening for malignant neoplasm of respiratory organs: Secondary | ICD-10-CM | POA: Insufficient documentation

## 2024-01-18 ENCOUNTER — Other Ambulatory Visit: Payer: Self-pay | Admitting: Acute Care

## 2024-01-18 DIAGNOSIS — F17210 Nicotine dependence, cigarettes, uncomplicated: Secondary | ICD-10-CM

## 2024-01-18 DIAGNOSIS — Z122 Encounter for screening for malignant neoplasm of respiratory organs: Secondary | ICD-10-CM

## 2024-01-18 DIAGNOSIS — Z87891 Personal history of nicotine dependence: Secondary | ICD-10-CM

## 2024-01-21 ENCOUNTER — Encounter: Payer: Self-pay | Admitting: Family Medicine

## 2024-01-21 DIAGNOSIS — I2510 Atherosclerotic heart disease of native coronary artery without angina pectoris: Secondary | ICD-10-CM | POA: Insufficient documentation

## 2024-02-03 ENCOUNTER — Ambulatory Visit (INDEPENDENT_AMBULATORY_CARE_PROVIDER_SITE_OTHER)

## 2024-02-03 DIAGNOSIS — Z86718 Personal history of other venous thrombosis and embolism: Secondary | ICD-10-CM

## 2024-02-03 LAB — POCT INR
INR: 3.2 — AB (ref 2.0–3.0)
POC INR: 3.2
PT: 38.6

## 2024-02-03 NOTE — Patient Instructions (Signed)
 Description   Continue 5 mg daily and return in 3 weeks

## 2024-02-24 ENCOUNTER — Ambulatory Visit: Payer: PRIVATE HEALTH INSURANCE

## 2024-02-24 DIAGNOSIS — Z86718 Personal history of other venous thrombosis and embolism: Secondary | ICD-10-CM

## 2024-02-24 LAB — POCT INR
INR: 2.5 (ref 2.0–3.0)
POC INR: 2.5
PT: 29.9

## 2024-02-24 NOTE — Patient Instructions (Signed)
Description   Continue 5 mg daily and return in 4 weeks

## 2024-03-16 ENCOUNTER — Ambulatory Visit: Payer: PRIVATE HEALTH INSURANCE

## 2024-03-23 ENCOUNTER — Ambulatory Visit: Payer: PRIVATE HEALTH INSURANCE

## 2024-03-24 ENCOUNTER — Other Ambulatory Visit: Payer: Self-pay | Admitting: Psychiatry

## 2024-03-24 DIAGNOSIS — F418 Other specified anxiety disorders: Secondary | ICD-10-CM

## 2024-03-30 ENCOUNTER — Ambulatory Visit: Payer: PRIVATE HEALTH INSURANCE

## 2024-03-30 DIAGNOSIS — Z86718 Personal history of other venous thrombosis and embolism: Secondary | ICD-10-CM

## 2024-03-30 LAB — POCT INR
INR: 2.5 (ref 2.0–3.0)
PT: 30.6

## 2024-03-30 NOTE — Patient Instructions (Signed)
Description   Continue 5 mg daily and return in 4 weeks

## 2024-03-30 NOTE — Progress Notes (Signed)
 Patient here for INR checked.

## 2024-04-26 ENCOUNTER — Telehealth: Payer: Self-pay | Admitting: Family Medicine

## 2024-04-26 ENCOUNTER — Other Ambulatory Visit: Payer: Self-pay

## 2024-04-26 DIAGNOSIS — I6930 Unspecified sequelae of cerebral infarction: Secondary | ICD-10-CM

## 2024-04-26 DIAGNOSIS — J301 Allergic rhinitis due to pollen: Secondary | ICD-10-CM

## 2024-04-26 DIAGNOSIS — I10 Essential (primary) hypertension: Secondary | ICD-10-CM

## 2024-04-26 MED ORDER — ATORVASTATIN CALCIUM 10 MG PO TABS: 10.0000 mg | ORAL_TABLET | Freq: Every day | ORAL | 4 refills | Status: DC

## 2024-04-26 MED ORDER — AMLODIPINE BESY-BENAZEPRIL HCL 5-10 MG PO CAPS: 1.0000 | ORAL_CAPSULE | Freq: Every day | ORAL | 3 refills | Status: AC

## 2024-04-26 MED ORDER — MONTELUKAST SODIUM 10 MG PO TABS: 10.0000 mg | ORAL_TABLET | Freq: Every day | ORAL | 3 refills | Status: AC

## 2024-04-26 MED ORDER — WARFARIN SODIUM 5 MG PO TABS: ORAL_TABLET | ORAL | 3 refills | Status: AC

## 2024-04-26 NOTE — Telephone Encounter (Signed)
 [facility] faxed refill request for the following medications:  atorvastatin  (LIPITOR) 10 MG tablet   warfarin (COUMADIN ) 5 MG tablet   amLODipine -benazepril  (LOTREL) 5-10 MG capsule   montelukast  (SINGULAIR ) 10 MG tablet      Please advise.

## 2024-05-04 ENCOUNTER — Ambulatory Visit: Payer: PRIVATE HEALTH INSURANCE

## 2024-05-04 DIAGNOSIS — Z86718 Personal history of other venous thrombosis and embolism: Secondary | ICD-10-CM | POA: Diagnosis not present

## 2024-05-04 LAB — POCT INR
INR: 2.3 (ref 2.0–3.0)
POC INR: 2.3
PT: 28

## 2024-05-18 ENCOUNTER — Ambulatory Visit
Admission: RE | Admit: 2024-05-18 | Discharge: 2024-05-18 | Disposition: A | Discharge status: Home / Self Care | Attending: Family Medicine | Admitting: Family Medicine

## 2024-05-18 ENCOUNTER — Encounter: Payer: Self-pay | Admitting: Family Medicine

## 2024-05-18 ENCOUNTER — Ambulatory Visit
Admission: RE | Admit: 2024-05-18 | Discharge: 2024-05-18 | Disposition: A | Source: Ambulatory Visit | Attending: Family Medicine | Admitting: Family Medicine

## 2024-05-18 ENCOUNTER — Ambulatory Visit: Payer: PRIVATE HEALTH INSURANCE | Admitting: Family Medicine

## 2024-05-18 VITALS — BP 116/67 | HR 81 | Resp 16 | Ht 66.0 in | Wt 220.0 lb

## 2024-05-18 DIAGNOSIS — M545 Low back pain, unspecified: Secondary | ICD-10-CM

## 2024-05-18 DIAGNOSIS — R0789 Other chest pain: Secondary | ICD-10-CM

## 2024-05-18 DIAGNOSIS — G8929 Other chronic pain: Secondary | ICD-10-CM

## 2024-05-20 NOTE — Progress Notes (Signed)
 "     Established patient visit   [name]   DOB: 03/20/1951   74 y.o. Male  MRN: [id number] Visit Date: 05/18/2024  Today's healthcare provider: Nancyann Perry, MD   Chief Complaint  Patient presents with   Acute Visit   Back Pain    Back Pain ( pt wants Xray)    Subjective    Discussed the use of AI scribe software for clinical note transcription with the patient, who gave verbal consent to proceed.  History of Present Illness   Chase [name] is a 74 year old male who presents with long standing low back pain and left rib pain following a fall.  He has experienced back pain for several years, which has recently worsened. The pain is primarily located in the lower back, a little off to the right of the center. It worsens when bending over for extended periods and is more severe in the morning, improving somewhat as the day progresses. No radiating pain to the legs, hips, knees, or toes. He takes acetaminophen  in the morning and afternoon, which provides some relief. He denies any sciatica pain.  He also has left rib pain following a fall approximately six weeks ago. He slipped on a wet rock and fell onto a bench arm, impacting his left side under the ribs. The pain is not severe but prevents him from sleeping on his preferred side. It is more noticeable in the morning and lessens throughout the day unless aggravated by movement.  Additionally, he has a bump on his hand, which he associates with a past finger fracture from his teenage years. He believes this may be arthritis-related, as the bump appeared suddenly and causes discomfort, particularly in cold weather. His spouse recalls that he has complained of similar pain for many years, even before a past stroke.  He has a long history of seeing a chiropractor for back issues, dating back to his college years and subsequent work at a trucking company. He has not visited the chiropractor recently due to insurance changes  but has been a regular patient for many years.       Medications: Show/hide medication list[1] Review of Systems     Objective    BP 116/67 (BP Location: Left Arm, Patient Position: Sitting, Cuff Size: Large)   Pulse 81   Resp 16   Ht 5' 6 (1.676 m)   Wt 220 lb (99.8 kg)   SpO2 99%   BMI 35.51 kg/m   Physical Exam   General: Appearance:    Mildly obese male in no acute distress  Eyes:    PERRL, conjunctiva/corneas clear, EOM's intact       Lungs:     Clear to auscultation bilaterally, respirations unlabored  Heart:    Normal heart rate. Normal rhythm. No murmurs, rubs, or gallops.    MS:   All extremities are intact.    Neurologic:   Awake, alert, oriented x 3. No apparent focal neurological defect.          Assessment & Plan        Chronic low back pain without sciatica Chronic low back pain localized to the lumbar spine, worsens with bending, improves with standing. No sciatica or radiating pain. - Ordered x-ray of the lumbar spine.  Left rib pain following trauma Left rib pain post-fall, affecting sleep, possible callus formation. - Ordered x-ray of the left rib cage.  Hand pain with possible osteoarthritis Hand pain with bump,  likely osteoarthritis, exacerbated by cold weather.      Nancyann Perry, MD  [facility] [phone] (phone) [phone] (fax)  [facility]    [1]  Outpatient Medications Prior to Visit  Medication Sig   amLODipine -benazepril  (LOTREL) 5-10 MG capsule Take 1 capsule by mouth daily.   amoxicillin (AMOXIL) 500 MG tablet Take by mouth as directed. 1 hour prior to dental procedures   atorvastatin  (LIPITOR) 10 MG tablet Take 1 tablet (10 mg total) by mouth daily.   busPIRone  (BUSPAR ) 7.5 MG tablet TAKE 1 TABLET BY MOUTH TWICE A DAY   cyanocobalamin  1000 MCG tablet Take 1 tablet (1,000 mcg total) by mouth daily.   doxazosin  (CARDURA ) 1 MG tablet Take 1 tablet (1 mg total) by mouth daily.    DULoxetine  (CYMBALTA ) 60 MG capsule TAKE 1 CAPSULE BY MOUTH EVERY DAY   fluticasone (FLONASE) 50 MCG/ACT nasal spray Place 1 spray into both nostrils as needed.    hydrOXYzine  (VISTARIL ) 25 MG capsule Take 1 capsule (25 mg total) by mouth daily as needed for anxiety.   montelukast  (SINGULAIR ) 10 MG tablet Take 1 tablet (10 mg total) by mouth at bedtime.   warfarin (COUMADIN ) 5 MG tablet TAKE 1 OR 2 TABLETS (5 OR 10MG ) DAILY AS DIRECTED BY PHYSICIAN   No facility-administered medications prior to visit.   "

## 2024-05-30 ENCOUNTER — Ambulatory Visit: Payer: Self-pay | Admitting: Family Medicine

## 2024-05-30 DIAGNOSIS — I10 Essential (primary) hypertension: Secondary | ICD-10-CM

## 2024-05-30 MED ORDER — ATORVASTATIN CALCIUM 20 MG PO TABS: 20.0000 mg | ORAL_TABLET | Freq: Every day | ORAL | 1 refills | Status: AC

## 2024-06-01 ENCOUNTER — Ambulatory Visit: Payer: PRIVATE HEALTH INSURANCE

## 2024-06-01 DIAGNOSIS — Z86718 Personal history of other venous thrombosis and embolism: Secondary | ICD-10-CM

## 2024-06-01 LAB — POCT INR
INR: 2.3 (ref 2.0–3.0)
POC INR: 2.3
PT: 27.4

## 2024-06-01 NOTE — Patient Instructions (Signed)
Description   Continue 5 mg daily and return in 4 weeks

## 2024-06-03 ENCOUNTER — Encounter: Payer: Self-pay | Admitting: Psychiatry

## 2024-06-03 ENCOUNTER — Telehealth: Admitting: Psychiatry

## 2024-06-03 DIAGNOSIS — F418 Other specified anxiety disorders: Secondary | ICD-10-CM

## 2024-06-03 DIAGNOSIS — F0631 Mood disorder due to known physiological condition with depressive features: Secondary | ICD-10-CM

## 2024-06-03 DIAGNOSIS — Z789 Other specified health status: Secondary | ICD-10-CM

## 2024-06-03 DIAGNOSIS — G2401 Drug induced subacute dyskinesia: Secondary | ICD-10-CM

## 2024-06-03 DIAGNOSIS — F17200 Nicotine dependence, unspecified, uncomplicated: Secondary | ICD-10-CM

## 2024-06-03 DIAGNOSIS — F09 Unspecified mental disorder due to known physiological condition: Secondary | ICD-10-CM

## 2024-06-03 MED ORDER — BUSPIRONE HCL 7.5 MG PO TABS: 7.5000 mg | ORAL_TABLET | Freq: Two times a day (BID) | ORAL | 1 refills | Status: AC

## 2024-06-03 NOTE — Progress Notes (Signed)
 Virtual Visit via Video Note  I connected with Chase [name] on 06/03/24 at 11:40 AM EST by a video enabled telemedicine application and verified that I am speaking with the correct person using two identifiers.  Location Provider Location : [facility] Patient Location : Home  Participants: Patient , Spouse,Provider   I discussed the limitations of evaluation and management by telemedicine and the availability of in person appointments. The patient expressed understanding and agreed to proceed.   I discussed the assessment and treatment plan with the patient. The patient was provided an opportunity to ask questions and all were answered. The patient agreed with the plan and demonstrated an understanding of the instructions.   The patient was advised to call back or seek an in-person evaluation if the symptoms worsen or if the condition fails to improve as anticipated.   [facility] MD OP Progress Note  06/03/2024 12:08 PM Chase [name]  MRN:  [id number]  Chief Complaint:  Chief Complaint  Patient presents with   Medication Refill   Follow-up   Depression   Anxiety   Discussed the use of AI scribe software for clinical note transcription with the patient, who gave verbal consent to proceed.  History of Present Illness Chase [name] is a 73 year old Caucasian male, married, lives in [place], has a history of depression, anxiety, cognitive disorder, alcohol  use disorder, tobacco use disorder was evaluated by telemedicine today.  Collateral information obtained from wife who was present in session since patient is a limited historian.  Describing his current state, he reports feeling generally well and notes experiencing some restlessness due to being unable to work outdoors following the recent snowstorm. He spends time indoors watching TV and interacting with his cats. He denies significant symptoms of depression or anxiety and expresses no concerns about his mood or mental health at this  time. He reports sleeping well.  He confirms taking his medications as prescribed. His wife notes that he continues to take buspirone  7.5 mg twice daily, with the option to cut tablets in half if needed, and hydroxyzine  as an emergency anxiety medication, and the duloxetine  as prescribed.  Denies side effects.  He denies thoughts of harming himself or others.  He denies any perceptual disturbances.  He continues to struggle with cognitive issues, chronic.  He however was alert, oriented to person place time situation.  3 word memory immediate 3 out of 3, after 5 minutes 2 out of 3.  Attention and focus seem to be fair in session today, was able to do serial sevens as well as digits forward and backward with some support.He maintains social connections and looks forward to an upcoming trip with friends for Valentine's Day, which he anticipates will be enjoyable and beneficial for social interaction.  He reports ongoing tobacco use, specifically smoking cigarettes indoors. He also reports ongoing alcohol  use, currently drinking approximately 4 drinks per day, consistent with his prior report. He states that he does not intend to quit smoking or drinking.  Visit Diagnosis:    ICD-10-CM   1. Depressive disorder due to another medical condition with depressive features  F06.31    History of CVA    2. Other specified anxiety disorders  F41.8    with limited symptom attacks    3. Significant use of alcohol   Z78.9     4. Tobacco use disorder  F17.200 busPIRone  (BUSPAR ) 7.5 MG tablet   Severe    5. Tardive dyskinesia  G24.01     6.  Mild cognitive disorder  F09       Past Psychiatric History: I have reviewed past psychiatric history from progress note on 08/10/2017.  Past trials of medications-Effexor , Wellbutrin, Cymbalta , Xanax , Klonopin .  Past Medical History:  Past Medical History:  Diagnosis Date   Acute metabolic encephalopathy 08/09/2022   Cerebrovascular accident (CVA) (HCC)  10/09/2011   Clotting disorder    DVT (deep venous thrombosis) (HCC)    Hearing deficit    History of ischemic vertebrobasilar artery thalamic stroke 2001   History of stroke    Hyperlipidemia    Hypertension    Patent foramen ovale    Right knee DJD    Stroke (HCC) 2001    Past Surgical History:  Procedure Laterality Date   APPENDECTOMY     FRACTURE SURGERY     R leg   INCISION AND DRAINAGE Right 06/30/2012   Procedure: INCISION AND DRAINAGE;  Surgeon: Lamar DELENA Millman, MD;  Location: [facility] OR;  Service: Orthopedics;  Laterality: Right;   KNEE ARTHROSCOPY     right knee   KNEE BURSECTOMY Right 06/30/2012   Procedure: KNEE BURSECTOMY;  Surgeon: Lamar DELENA Millman, MD;  Location: [facility] OR;  Service: Orthopedics;  Laterality: Right;  Pre-patellar with wound closure   TOTAL KNEE ARTHROPLASTY  01/26/2012   Procedure: TOTAL KNEE ARTHROPLASTY;  Surgeon: Lamar DELENA Millman, MD;  Location: [facility] OR;  Service: Orthopedics;  Laterality: Right;    Family Psychiatric History: I have reviewed family psychiatric history from progress note on 08/10/2017.  Family History:  Family History  Problem Relation Age of Onset   Cancer Father    Seizures Brother     Social History: I have reviewed social history from progress note on 08/10/2017. Social History   Socioeconomic History   Marital status: Married    Spouse name: Chase [name]   Number of children: 0   Years of education: 12   Highest education level: Master's degree (e.g., MA, MS, MEng, MEd, MSW, MBA)  Occupational History   Occupation: retired   Occupation: disabled @ age 77  Tobacco Use   Smoking status: Every Day    Current packs/day: 0.00    Average packs/day: 2.0 packs/day for 49.0 years (98.0 ttl pk-yrs)    Types: Cigarettes    Start date: 06/14/1967    Last attempt to quit: 06/13/2016    Years since quitting: 7.9   Smokeless tobacco: Never   Tobacco comments:    started smoking at age 89; pt had quit but says he started smoking again   Vaping  Use   Vaping status: Never Used  Substance and Sexual Activity   Alcohol  use: Yes    Alcohol /week: 28.0 - 42.0 standard drinks of alcohol     Types: 28 - 42 Cans of beer per week    Comment: 4-6 beers a day   Drug use: No   Sexual activity: Not Currently  Other Topics Concern   Not on file  Social History Narrative   Not on file   Social Drivers of Health   Tobacco Use: High Risk (06/03/2024)   Patient History    Smoking Tobacco Use: Every Day    Smokeless Tobacco Use: Never    Passive Exposure: Not on file  Financial Resource Strain: Low Risk (08/10/2023)   Overall Financial Resource Strain (CARDIA)    Difficulty of Paying Living Expenses: Not hard at all  Food Insecurity: No Food Insecurity (08/10/2023)   Hunger Vital Sign    Worried About Running Out  of Food in the Last Year: Never true    Ran Out of Food in the Last Year: Never true  Transportation Needs: No Transportation Needs (08/10/2023)   PRAPARE - Administrator, Civil Service (Medical): No    Lack of Transportation (Non-Medical): No  Physical Activity: Patient Declined (08/10/2023)   Exercise Vital Sign    Days of Exercise per Week: Patient declined    Minutes of Exercise per Session: Patient declined  Stress: No Stress Concern Present (08/10/2023)   [employer] of Occupational Health - Occupational Stress Questionnaire    Feeling of Stress : Not at all  Social Connections: Moderately Integrated (08/10/2023)   Social Connection and Isolation Panel    Frequency of Communication with Friends and Family: More than three times a week    Frequency of Social Gatherings with Friends and Family: More than three times a week    Attends Religious Services: More than 4 times per year    Active Member of Clubs or Organizations: No    Attends Banker Meetings: Never    Marital Status: Married  Depression (PHQ2-9): Low Risk (09/16/2023)   Depression (PHQ2-9)    PHQ-2 Score: 0  Alcohol  Screen: Low  Risk (08/10/2023)   Alcohol  Screen    Last Alcohol  Screening Score (AUDIT): 0  Housing: Low Risk (08/10/2023)   Housing Stability Vital Sign    Unable to Pay for Housing in the Last Year: No    Number of Times Moved in the Last Year: 0    Homeless in the Last Year: No  Utilities: Not At Risk (08/10/2023)   AHC Utilities    Threatened with loss of utilities: No  Health Literacy: Adequate Health Literacy (08/10/2023)   B1300 Health Literacy    Frequency of need for help with medical instructions: Never    Allergies: Allergies[1]  Metabolic Disorder Labs: Lab Results  Component Value Date   HGBA1C 5.4 08/07/2022   MPG 108 08/07/2022   No results found for: PROLACTIN Lab Results  Component Value Date   CHOL 132 12/18/2023   TRIG 79 12/18/2023   HDL 65 12/18/2023   CHOLHDL 2.0 12/18/2023   VLDL 12 08/08/2022   LDLCALC 52 12/18/2023   LDLCALC 55 10/10/2022   Lab Results  Component Value Date   TSH 3.310 08/12/2022   TSH 2.25 03/13/2017    Therapeutic Level Labs: No results found for: LITHIUM No results found for: VALPROATE No results found for: CBMZ  Current Medications: Current Outpatient Medications  Medication Sig Dispense Refill   amLODipine -benazepril  (LOTREL) 5-10 MG capsule Take 1 capsule by mouth daily. 90 capsule 3   amoxicillin (AMOXIL) 500 MG tablet Take by mouth as directed. 1 hour prior to dental procedures     atorvastatin  (LIPITOR) 20 MG tablet Take 1 tablet (20 mg total) by mouth daily. 90 tablet 1   busPIRone  (BUSPAR ) 7.5 MG tablet Take 1 tablet (7.5 mg total) by mouth 2 (two) times daily. 180 tablet 1   cyanocobalamin  1000 MCG tablet Take 1 tablet (1,000 mcg total) by mouth daily.     doxazosin  (CARDURA ) 1 MG tablet Take 1 tablet (1 mg total) by mouth daily. 90 tablet 3   DULoxetine  (CYMBALTA ) 60 MG capsule TAKE 1 CAPSULE BY MOUTH EVERY DAY 90 capsule 3   fluticasone (FLONASE) 50 MCG/ACT nasal spray Place 1 spray into both nostrils as needed.       hydrOXYzine  (VISTARIL ) 25 MG capsule Take 1 capsule (25 mg  total) by mouth daily as needed for anxiety. 90 capsule 1   montelukast  (SINGULAIR ) 10 MG tablet Take 1 tablet (10 mg total) by mouth at bedtime. 90 tablet 3   warfarin (COUMADIN ) 5 MG tablet TAKE 1 OR 2 TABLETS (5 OR 10MG ) DAILY AS DIRECTED BY PHYSICIAN 180 tablet 3   No current facility-administered medications for this visit.     Musculoskeletal: Strength & Muscle Tone: UTA Gait & Station: Seated Patient leans: N/A  Psychiatric Specialty Exam: Review of Systems  Psychiatric/Behavioral:  The patient is nervous/anxious.     There were no vitals taken for this visit.There is no height or weight on file to calculate BMI.  General Appearance: Casual  Eye Contact:  Fair  Speech:  Normal Rate  Volume:  Normal  Mood:  Anxious coping well  Affect:  Appropriate  Thought Process:  Goal Directed and Descriptions of Associations: Intact  Orientation:  Full (Time, Place, and Person)  Thought Content: Logical   Suicidal Thoughts:  No  Homicidal Thoughts:  No  Memory:  Immediate;   Fair Recent;   limited Remote;   limited  Judgement:  Fair  Insight:  Fair  Psychomotor Activity:  Normal  Concentration:  Concentration: Fair and Attention Span: Fair  Recall:  [employer] of Knowledge: Fair  Language: Fair  Akathisia:  No  Handed:  Right  AIMS (if indicated): not done  Assets:  Communication Skills Desire for Improvement Housing Talents/Skills Transportation  ADL's:  Intact  Cognition: baseline  Sleep:  Fair   Screenings: Midwife Visit from 07/10/2022 in [facility] Video Visit from 05/20/2021 in [facility] Video Visit from 01/21/2021 in [facility] Office Visit from 10/16/2020 in [facility]  AIMS Total Score 1 2 2 2    AUDIT     Flowsheet Row Office Visit from 06/25/2021 in [facility] Office Visit from 07/04/2020 in [facility] Clinical Support from 06/19/2020 in [facility] Clinical Support from 04/25/2019 in [facility]  Alcohol  Use Disorder Identification Test Final Score (AUDIT) 11 12 13 10    GAD-7    Flowsheet Row Office Visit from 09/16/2023 in [facility] Office Visit from 08/04/2023 in [facility] Office Visit from 07/14/2023 in [facility] Office Visit from 04/06/2023 in [facility] Office Visit from 07/10/2022 in [facility]  Total GAD-7 Score 0 0 0 0 1   PHQ2-9    Flowsheet Row Office Visit from 09/16/2023 in [facility] Clinical Support from 08/10/2023 in [facility] Office Visit from 08/04/2023 in [facility] Office Visit from 07/14/2023 in [facility] Office Visit from 04/06/2023 in [facility]  PHQ-2 Total Score 0 0 0 0 0  PHQ-9 Total Score 0 0 -- 3 3   Flowsheet Row Video Visit from 06/03/2024 in [facility] Video Visit from 12/30/2023 in [facility] Office Visit from 08/04/2023 in [facility]  C-SSRS RISK CATEGORY No Risk No Risk No Risk     Assessment and Plan: Chase [name] is a 74 year old Caucasian male with history of depression, anxiety, cognitive  impairment was evaluated by telemedicine today.  Discussed assessment and plan as noted below.  1. Depressive disorder in remission 2. Other specified anxiety disorders-stable Currently reports overall mood symptoms are stable  although anxious about current situational challenges, recent snowstorm has been coping well. Continue Cymbalta  60 mg daily Continue BuSpar  7.5 mg twice a day with an option to take 1/2 tablet instead of 1 tablet for the second dose. Continue Hydroxyzine  25 mg daily as needed.  3. Significant use of alcohol -unstable 4. Tobacco use disorder severe-unstable Not interested in quitting.  Attempted counseling.  5. Tardive dyskinesia-chronic Not interested in medication management, reports it is not distressing.  6. Mild cognitive disorder-chronic Currently has good support system and overall stable.  Denies any worsening of memory problems recently.  Encouraged to follow-up with primary care provider as well as neurology as needed  Collateral information obtained from spouse who was present in session today.  Follow-up Follow-up in clinic in 5 to 6 months or sooner in person.  Collaboration of Care: Collaboration of Care: Primary Care Provider AEB encouraged to follow-up with primary care provider to repeat labs including sodium, platelet counts.  Patient/Guardian was advised Release of Information must be obtained prior to any record release in order to collaborate their care with an outside provider. Patient/Guardian was advised if they have not already done so to contact the registration department to sign all necessary forms in order for us  to release information regarding their care.   Consent: Patient/Guardian gives verbal consent for treatment and assignment of benefits for services provided during this visit. Patient/Guardian expressed understanding and agreed to proceed.  This note was generated in part or whole with voice recognition software. Voice recognition is usually quite accurate but there are transcription errors that can and very often do occur. I apologize for any typographical errors that were not detected and corrected.     Zacharey Jensen, MD 06/03/2024, 12:08 PM      [1]  Allergies Allergen Reactions   Klonopin  [Clonazepam ] Palpitations   Chantix [Varenicline Tartrate]     confusion

## 2024-08-16 ENCOUNTER — Ambulatory Visit: Payer: PRIVATE HEALTH INSURANCE

## 2024-12-06 ENCOUNTER — Ambulatory Visit: Admitting: Psychiatry
